# Patient Record
Sex: Female | Born: 1955
Health system: Southern US, Community
[De-identification: ages and names within clinical notes are randomized; demographics above are authoritative.]

## PROBLEM LIST (undated history)

## (undated) DIAGNOSIS — K5190 Ulcerative colitis, unspecified, without complications: Secondary | ICD-10-CM

## (undated) DIAGNOSIS — M1990 Unspecified osteoarthritis, unspecified site: Secondary | ICD-10-CM

## (undated) DIAGNOSIS — L409 Psoriasis, unspecified: Secondary | ICD-10-CM

## (undated) DIAGNOSIS — F32A Depression, unspecified: Secondary | ICD-10-CM

## (undated) DIAGNOSIS — R0683 Snoring: Principal | ICD-10-CM

## (undated) DIAGNOSIS — F329 Major depressive disorder, single episode, unspecified: Secondary | ICD-10-CM

## (undated) DIAGNOSIS — J189 Pneumonia, unspecified organism: Secondary | ICD-10-CM

## (undated) DIAGNOSIS — M549 Dorsalgia, unspecified: Secondary | ICD-10-CM

## (undated) DIAGNOSIS — K529 Noninfective gastroenteritis and colitis, unspecified: Secondary | ICD-10-CM

## (undated) DIAGNOSIS — K589 Irritable bowel syndrome without diarrhea: Secondary | ICD-10-CM

## (undated) DIAGNOSIS — E7800 Pure hypercholesterolemia, unspecified: Secondary | ICD-10-CM

## (undated) DIAGNOSIS — R51 Headache: Secondary | ICD-10-CM

## (undated) DIAGNOSIS — G44009 Cluster headache syndrome, unspecified, not intractable: Secondary | ICD-10-CM

## (undated) DIAGNOSIS — E669 Obesity, unspecified: Secondary | ICD-10-CM

## (undated) DIAGNOSIS — M25569 Pain in unspecified knee: Secondary | ICD-10-CM

## (undated) DIAGNOSIS — M2550 Pain in unspecified joint: Secondary | ICD-10-CM

## (undated) HISTORY — DX: Headache: R51

## (undated) HISTORY — DX: Ulcerative colitis, unspecified, without complications: K51.90

## (undated) HISTORY — PX: JOINT REPLACEMENT: SHX530

## (undated) HISTORY — DX: Pain in unspecified joint: M25.50

## (undated) HISTORY — DX: Psoriasis, unspecified: L40.9

## (undated) HISTORY — DX: Obesity, unspecified: E66.9

## (undated) HISTORY — DX: Pure hypercholesterolemia, unspecified: E78.00

## (undated) HISTORY — DX: Snoring: R06.83

## (undated) HISTORY — DX: Unspecified osteoarthritis, unspecified site: M19.90

## (undated) HISTORY — DX: Cluster headache syndrome, unspecified, not intractable: G44.009

## (undated) HISTORY — DX: Dorsalgia, unspecified: M54.9

## (undated) HISTORY — DX: Noninfective gastroenteritis and colitis, unspecified: K52.9

## (undated) HISTORY — DX: Irritable bowel syndrome, unspecified: K58.9

## (undated) HISTORY — DX: Pain in unspecified knee: M25.569

---

## 1988-12-03 HISTORY — PX: BREAST BIOPSY: SHX20

## 1992-12-03 HISTORY — PX: BREAST EXCISIONAL BIOPSY: SUR124

## 2000-09-04 ENCOUNTER — Encounter: Payer: Self-pay | Admitting: Family Medicine

## 2000-09-04 ENCOUNTER — Encounter: Admission: RE | Admit: 2000-09-04 | Discharge: 2000-09-04 | Payer: Self-pay | Admitting: Family Medicine

## 2001-09-18 ENCOUNTER — Encounter: Admission: RE | Admit: 2001-09-18 | Discharge: 2001-09-18 | Payer: Self-pay | Admitting: Family Medicine

## 2001-09-18 ENCOUNTER — Encounter: Payer: Self-pay | Admitting: Family Medicine

## 2002-06-26 ENCOUNTER — Ambulatory Visit (HOSPITAL_COMMUNITY): Admission: RE | Admit: 2002-06-26 | Discharge: 2002-06-26 | Payer: Self-pay | Admitting: *Deleted

## 2002-06-26 ENCOUNTER — Encounter (INDEPENDENT_AMBULATORY_CARE_PROVIDER_SITE_OTHER): Payer: Self-pay | Admitting: Specialist

## 2002-09-22 ENCOUNTER — Encounter: Payer: Self-pay | Admitting: Family Medicine

## 2002-09-22 ENCOUNTER — Encounter: Admission: RE | Admit: 2002-09-22 | Discharge: 2002-09-22 | Payer: Self-pay | Admitting: Family Medicine

## 2003-09-24 ENCOUNTER — Encounter: Admission: RE | Admit: 2003-09-24 | Discharge: 2003-09-24 | Payer: Self-pay | Admitting: Family Medicine

## 2003-09-24 ENCOUNTER — Encounter: Payer: Self-pay | Admitting: Family Medicine

## 2004-09-29 ENCOUNTER — Encounter: Admission: RE | Admit: 2004-09-29 | Discharge: 2004-09-29 | Payer: Self-pay | Admitting: Family Medicine

## 2005-05-09 ENCOUNTER — Ambulatory Visit: Payer: Self-pay | Admitting: Internal Medicine

## 2005-06-07 ENCOUNTER — Ambulatory Visit: Payer: Self-pay | Admitting: Internal Medicine

## 2005-08-09 ENCOUNTER — Ambulatory Visit: Payer: Self-pay | Admitting: Internal Medicine

## 2005-10-30 ENCOUNTER — Ambulatory Visit: Payer: Self-pay | Admitting: Internal Medicine

## 2006-11-20 ENCOUNTER — Encounter: Admission: RE | Admit: 2006-11-20 | Discharge: 2006-11-20 | Payer: Self-pay | Admitting: Family Medicine

## 2007-06-05 ENCOUNTER — Ambulatory Visit: Payer: Self-pay | Admitting: Internal Medicine

## 2007-06-05 LAB — CONVERTED CEMR LAB
ALT: 16 units/L (ref 0–35)
AST: 17 units/L (ref 0–37)
Albumin: 4.1 g/dL (ref 3.5–5.2)
Alkaline Phosphatase: 66 units/L (ref 39–117)
BUN: 12 mg/dL (ref 6–23)
Basophils Absolute: 0 10*3/uL (ref 0.0–0.1)
Basophils Relative: 0.5 % (ref 0.0–1.0)
Bilirubin, Direct: 0.1 mg/dL (ref 0.0–0.3)
CO2: 26 meq/L (ref 19–32)
Calcium: 9.7 mg/dL (ref 8.4–10.5)
Chloride: 110 meq/L (ref 96–112)
Creatinine, Ser: 0.8 mg/dL (ref 0.4–1.2)
Eosinophils Absolute: 0 10*3/uL (ref 0.0–0.6)
Eosinophils Relative: 0.2 % (ref 0.0–5.0)
GFR calc Af Amer: 98 mL/min
GFR calc non Af Amer: 81 mL/min
Glucose, Bld: 110 mg/dL — ABNORMAL HIGH (ref 70–99)
HCT: 40.5 % (ref 36.0–46.0)
Hemoglobin: 13.5 g/dL (ref 12.0–15.0)
Lymphocytes Relative: 16.2 % (ref 12.0–46.0)
MCHC: 33.3 g/dL (ref 30.0–36.0)
MCV: 89.9 fL (ref 78.0–100.0)
Monocytes Absolute: 0.2 10*3/uL (ref 0.2–0.7)
Monocytes Relative: 2.6 % — ABNORMAL LOW (ref 3.0–11.0)
Neutro Abs: 6.5 10*3/uL (ref 1.4–7.7)
Neutrophils Relative %: 80.5 % — ABNORMAL HIGH (ref 43.0–77.0)
Platelets: 281 10*3/uL (ref 150–400)
Potassium: 4.3 meq/L (ref 3.5–5.1)
RBC: 4.51 M/uL (ref 3.87–5.11)
RDW: 13.4 % (ref 11.5–14.6)
Sed Rate: 16 mm/hr (ref 0–25)
Sodium: 141 meq/L (ref 135–145)
Total Bilirubin: 0.6 mg/dL (ref 0.3–1.2)
Total Protein: 7.1 g/dL (ref 6.0–8.3)
WBC: 8 10*3/uL (ref 4.5–10.5)

## 2007-06-23 ENCOUNTER — Encounter: Payer: Self-pay | Admitting: Internal Medicine

## 2007-06-23 ENCOUNTER — Ambulatory Visit: Payer: Self-pay | Admitting: Internal Medicine

## 2007-06-23 DIAGNOSIS — K5190 Ulcerative colitis, unspecified, without complications: Secondary | ICD-10-CM | POA: Insufficient documentation

## 2007-07-29 ENCOUNTER — Ambulatory Visit: Payer: Self-pay | Admitting: Internal Medicine

## 2007-11-13 ENCOUNTER — Ambulatory Visit: Payer: Self-pay | Admitting: Internal Medicine

## 2007-11-13 LAB — CONVERTED CEMR LAB
Basophils Absolute: 0 10*3/uL (ref 0.0–0.1)
Basophils Relative: 0.7 % (ref 0.0–1.0)
Eosinophils Absolute: 0 10*3/uL (ref 0.0–0.6)
Eosinophils Relative: 0.8 % (ref 0.0–5.0)
HCT: 41.4 % (ref 36.0–46.0)
Hemoglobin: 14.3 g/dL (ref 12.0–15.0)
Lymphocytes Relative: 38.4 % (ref 12.0–46.0)
MCHC: 34.5 g/dL (ref 30.0–36.0)
MCV: 93.9 fL (ref 78.0–100.0)
Monocytes Absolute: 0.4 10*3/uL (ref 0.2–0.7)
Monocytes Relative: 6.7 % (ref 3.0–11.0)
Neutro Abs: 2.9 10*3/uL (ref 1.4–7.7)
Neutrophils Relative %: 53.4 % (ref 43.0–77.0)
Platelets: 278 10*3/uL (ref 150–400)
RBC: 4.41 M/uL (ref 3.87–5.11)
RDW: 12.4 % (ref 11.5–14.6)
WBC: 5.3 10*3/uL (ref 4.5–10.5)

## 2007-11-18 ENCOUNTER — Encounter: Payer: Self-pay | Admitting: Internal Medicine

## 2007-11-25 ENCOUNTER — Encounter: Admission: RE | Admit: 2007-11-25 | Discharge: 2007-11-25 | Payer: Self-pay | Admitting: Family Medicine

## 2007-12-02 DIAGNOSIS — R1031 Right lower quadrant pain: Secondary | ICD-10-CM | POA: Insufficient documentation

## 2007-12-02 DIAGNOSIS — K625 Hemorrhage of anus and rectum: Secondary | ICD-10-CM | POA: Insufficient documentation

## 2007-12-02 DIAGNOSIS — R197 Diarrhea, unspecified: Secondary | ICD-10-CM | POA: Insufficient documentation

## 2008-01-08 ENCOUNTER — Encounter: Admission: RE | Admit: 2008-01-08 | Discharge: 2008-01-08 | Payer: Self-pay | Admitting: Family Medicine

## 2009-01-19 ENCOUNTER — Encounter: Admission: RE | Admit: 2009-01-19 | Discharge: 2009-01-19 | Payer: Self-pay | Admitting: Family Medicine

## 2009-03-21 ENCOUNTER — Ambulatory Visit: Payer: Self-pay | Admitting: Internal Medicine

## 2009-03-22 LAB — CONVERTED CEMR LAB
ALT: 15 units/L (ref 0–35)
AST: 17 units/L (ref 0–37)
Albumin: 4.5 g/dL (ref 3.5–5.2)
Alkaline Phosphatase: 71 units/L (ref 39–117)
BUN: 7 mg/dL (ref 6–23)
Basophils Absolute: 0 10*3/uL (ref 0.0–0.1)
Basophils Relative: 0.7 % (ref 0.0–3.0)
CO2: 26 meq/L (ref 19–32)
Calcium: 9.9 mg/dL (ref 8.4–10.5)
Chloride: 107 meq/L (ref 96–112)
Creatinine, Ser: 0.8 mg/dL (ref 0.4–1.2)
Eosinophils Absolute: 0.1 10*3/uL (ref 0.0–0.7)
Eosinophils Relative: 1.1 % (ref 0.0–5.0)
GFR calc non Af Amer: 80.02 mL/min (ref >60)
Glucose, Bld: 91 mg/dL (ref 70–99)
HCT: 42.2 % (ref 36.0–46.0)
Hemoglobin: 14.6 g/dL (ref 12.0–15.0)
Lymphocytes Relative: 33.6 % (ref 12.0–46.0)
Lymphs Abs: 2.1 10*3/uL (ref 0.7–4.0)
MCHC: 34.5 g/dL (ref 30.0–36.0)
MCV: 89.7 fL (ref 78.0–100.0)
Monocytes Absolute: 0.4 10*3/uL (ref 0.1–1.0)
Monocytes Relative: 5.7 % (ref 3.0–12.0)
Neutro Abs: 3.6 10*3/uL (ref 1.4–7.7)
Neutrophils Relative %: 58.9 % (ref 43.0–77.0)
Platelets: 238 10*3/uL (ref 150.0–400.0)
Potassium: 4.3 meq/L (ref 3.5–5.1)
RBC: 4.71 M/uL (ref 3.87–5.11)
RDW: 12.8 % (ref 11.5–14.6)
Sed Rate: 10 mm/hr (ref 0–22)
Sodium: 143 meq/L (ref 135–145)
Total Bilirubin: 0.5 mg/dL (ref 0.3–1.2)
Total Protein: 7.3 g/dL (ref 6.0–8.3)
WBC: 6.2 10*3/uL (ref 4.5–10.5)

## 2010-01-20 ENCOUNTER — Encounter: Admission: RE | Admit: 2010-01-20 | Discharge: 2010-01-20 | Payer: Self-pay | Admitting: Family Medicine

## 2010-08-29 ENCOUNTER — Emergency Department (HOSPITAL_COMMUNITY): Admission: EM | Admit: 2010-08-29 | Discharge: 2010-08-29 | Payer: Self-pay | Admitting: Emergency Medicine

## 2010-12-23 ENCOUNTER — Other Ambulatory Visit: Payer: Self-pay | Admitting: Family Medicine

## 2010-12-23 DIAGNOSIS — S299XXA Unspecified injury of thorax, initial encounter: Secondary | ICD-10-CM

## 2011-01-02 NOTE — Procedures (Signed)
Summary: Gastroenterology Colon  Gastroenterology Colon   Imported By: Barb Merino RN 12/15/2007 09:09:31  _____________________________________________________________________  External Attachment:    Type:   Image     Comment:   External Document

## 2011-01-02 NOTE — Assessment & Plan Note (Signed)
Summary: FOLLOW UP/.YF    History of Present Illness Visit Type: Follow-up Visit Primary GI MD: Delfin Edis MD Primary Provider: Providence Crosby Chief Complaint: Yearly follow up visit No GI complaints History of Present Illness:   This is a 55 old white female who has inflammatory bowel disease diagnosed on a colonoscopy done in July 2008. The random colon biopsies confirmed  pancolitis , possibly  Crohn's disease. Her last office visit in December 2008 showed subtherapeutic  levels of 6 MP, probably due to pt forgetting to take her medication. She has been in remission now for almost 2 years and is not taking any of her medications. 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 She denies any rectal bleeding or abdominal pain. Her weight has been stable.   GI Review of Systems      Denies abdominal pain, acid reflux, belching, bloating, chest pain, dysphagia with liquids, dysphagia with solids, heartburn, loss of appetite, nausea, vomiting, vomiting blood, weight loss, and  weight gain.        Denies anal fissure, black tarry stools, change in bowel habit, constipation, diarrhea, diverticulosis, fecal incontinence, heme positive stool, hemorrhoids, irritable bowel syndrome, jaundice, light color stool, liver problems, rectal bleeding, and  rectal pain.    Current Medications (verified): 1)  None  Allergies (verified): 1)  ! Sulfa  Past History:  Past Medical History:    Current Problems:     RECTAL BLEEDING (ICD-569.3)    COLITIS, ULCERATIVE (ICD-556.9)    ABDOMINAL PAIN RIGHT LOWER QUADRANT (ICD-789.03)    DIARRHEA (ICD-787.91)     (12/02/2007)  Past Surgical History:    none (02/14/2009)  Family History:    Reviewed history from 02/14/2009 and no changes required:       No FH of Colon Cancer:       Family History of Diabetes: Father  Social History:    Patient currently smokes.      Alcohol Use - no    Illicit Drug Use - no    Daily  Caffeine Use coffee  Vital Signs:  Patient profile:   55 year old female Height:      63 inches Weight:      238.25 pounds BMI:     42.36 BSA:     2.08 Pulse rate:   64 / minute Pulse rhythm:   regular BP sitting:   118 / 82  (left arm)  Vitals Entered By: [place] (March 21, 2009 3:41 PM)  Physical Exam  General:  Well developed, well nourished, no acute distress. Lungs:  Clear throughout to auscultation. Heart:  Regular rate and rhythm; no murmurs, rubs or bruits. Abdomen:  Soft, nontender and nondistended. No masses, hepatosplenomegaly or hernias noted. Normal bowel sounds. Rectal:  soft hemoccult negative stool. No perianal disease.   Impression & Recommendations:  Problem # 1:  COLITIS, ULCERATIVE (ICD-556.9) inflammatory bowel disease since July 2008. Patient has been off all medications for the past 2 years. Her last medication was 6 MP. Patient is not interested in resuming medications unless she has a flareup. I have talked to her and explained that it advisable to stay on low dose of mesalamine. I have given her samples of Apriso 375 mg  to take 1 by mouth two times a day if she flares up. We will send her to the lab to have bloodwork drawn. Orders: TLB-CBC Platelet - w/Differential (85025-CBCD) TLB-CMP (Comprehensive Metabolic Pnl) (46503-TWSF) TLB-Sedimentation Rate (ESR) (  85652-ESR)  Problem # 2:  RECTAL BLEEDING (ICD-569.3) she is hemoccult-negative today. We will be checking her CBC today.  Patient Instructions: 1)  samples of Apriso 375 mg , 1 by mouth two times a day 2)  CBC, metabolic panel, and sedimentation rate 3)  office visit in one year 4)  Recall colonoscopy July 2013 5)  Copy sent to : Dr Morrie Sheldon

## 2011-01-22 ENCOUNTER — Ambulatory Visit
Admission: RE | Admit: 2011-01-22 | Discharge: 2011-01-22 | Disposition: A | Discharge status: Home / Self Care | Payer: BC Managed Care – PPO | Source: Ambulatory Visit | Attending: Family Medicine | Admitting: Family Medicine

## 2011-01-22 DIAGNOSIS — S299XXA Unspecified injury of thorax, initial encounter: Secondary | ICD-10-CM

## 2011-02-15 LAB — COMPREHENSIVE METABOLIC PANEL
ALT: 15 U/L (ref 0–35)
AST: 18 U/L (ref 0–37)
Albumin: 4.1 g/dL (ref 3.5–5.2)
Alkaline Phosphatase: 86 U/L (ref 39–117)
BUN: 10 mg/dL (ref 6–23)
CO2: 23 mEq/L (ref 19–32)
Calcium: 9.5 mg/dL (ref 8.4–10.5)
Chloride: 109 mEq/L (ref 96–112)
Creatinine, Ser: 0.77 mg/dL (ref 0.4–1.2)
GFR calc Af Amer: >60 mL/min (ref >60)
GFR calc non Af Amer: >60 mL/min (ref >60)
Glucose, Bld: 106 mg/dL — ABNORMAL HIGH (ref 70–99)
Potassium: 3.4 mEq/L — ABNORMAL LOW (ref 3.5–5.1)
Sodium: 140 mEq/L (ref 135–145)
Total Bilirubin: 0.4 mg/dL (ref 0.3–1.2)
Total Protein: 6.9 g/dL (ref 6.0–8.3)

## 2011-02-15 LAB — DIFFERENTIAL
Basophils Absolute: 0 10*3/uL (ref 0.0–0.1)
Basophils Relative: 1 % (ref 0–1)
Eosinophils Absolute: 0 10*3/uL (ref 0.0–0.7)
Eosinophils Relative: 1 % (ref 0–5)
Lymphocytes Relative: 35 % (ref 12–46)
Lymphs Abs: 2.4 10*3/uL (ref 0.7–4.0)
Monocytes Absolute: 0.4 10*3/uL (ref 0.1–1.0)
Monocytes Relative: 6 % (ref 3–12)
Neutro Abs: 4 10*3/uL (ref 1.7–7.7)
Neutrophils Relative %: 57 % (ref 43–77)

## 2011-02-15 LAB — CBC
HCT: 42.7 % (ref 36.0–46.0)
Hemoglobin: 14.5 g/dL (ref 12.0–15.0)
MCH: 30.3 pg (ref 26.0–34.0)
MCHC: 34 g/dL (ref 30.0–36.0)
MCV: 89.3 fL (ref 78.0–100.0)
Platelets: 242 10*3/uL (ref 150–400)
RBC: 4.78 MIL/uL (ref 3.87–5.11)
RDW: 12.8 % (ref 11.5–15.5)
WBC: 6.7 10*3/uL (ref 4.0–10.5)

## 2011-02-15 LAB — POCT I-STAT, CHEM 8
BUN: 10 mg/dL (ref 6–23)
Calcium, Ion: 1.02 mmol/L — ABNORMAL LOW (ref 1.12–1.32)
Chloride: 110 mEq/L (ref 96–112)
Creatinine, Ser: 0.7 mg/dL (ref 0.4–1.2)
Glucose, Bld: 104 mg/dL — ABNORMAL HIGH (ref 70–99)
HCT: 45 % (ref 36.0–46.0)
Hemoglobin: 15.3 g/dL — ABNORMAL HIGH (ref 12.0–15.0)
Potassium: 3.4 mEq/L — ABNORMAL LOW (ref 3.5–5.1)
Sodium: 139 mEq/L (ref 135–145)
TCO2: 21 mmol/L (ref 0–100)

## 2011-02-15 LAB — PROTIME-INR
INR: 0.93 (ref 0.00–1.49)
Prothrombin Time: 12.7 seconds (ref 11.6–15.2)

## 2011-02-15 LAB — POCT CARDIAC MARKERS
CKMB, poc: <1 ng/mL — ABNORMAL LOW (ref 1.0–8.0)
Myoglobin, poc: 108 ng/mL (ref 12–200)
Troponin i, poc: <0.05 ng/mL (ref 0.00–0.09)

## 2011-02-15 LAB — ETHANOL: Alcohol, Ethyl (B): <5 mg/dL (ref 0–10)

## 2011-02-15 LAB — APTT: aPTT: 25 seconds (ref 24–37)

## 2011-02-15 LAB — LACTIC ACID, PLASMA: Lactic Acid, Venous: 1.8 mmol/L (ref 0.5–2.2)

## 2011-04-17 NOTE — Assessment & Plan Note (Signed)
[place] OFFICE NOTE   TYNLEE, BAYLE                      MRN:          [id number]  DATE:11/13/2007                            DOB:          1956-11-09    Ms. Kluttz is a very nice 55 year old white female with a newly  diagnosed ulcerative colitis, documented on a colonoscopy on June 23, 2007, with random biopsies of the colon.  A right colon biopsy showed a  focal active mucosal colitis, descending colon also confirmed focal  active mucosal colitis, sigmoid colon showed focal active mucosal  colitis without granulomas.  Now in the rectum, there was some  regenerative type glands and signs of mucosal prolapse but no active  proctitis.  The patient has been started on 6-Mercaptopurine of 50 mg  daily and has remained completely asymptomatic.  She is down to having  only 1 bowel movement a day or twice a day.  No abdominal pain.  No  rectal bleeding.  General level of energy has been good.  She has  tapered her prednisone completely.  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  She is currently slowing down and will have a rather quiet  winter at work.  The strenuous job probably starts around March.   PHYSICAL EXAMINATION:  VITAL SIGNS:  Blood pressure 110/70, pulse 64,  weight 229 pounds.  GENERAL:  She was overweight, alert, oriented, no distress.  LUNGS:  Clear to auscultation.  COR:  Normal S1, normal S2.  ABDOMEN:  Soft, nontender with normoactive bowel sounds.  RECTAL:  Not done.   IMPRESSION:  A 55 year old white female with ulcerative colitis in  distribution throughout the entire colon as per biopsies in 2008.   PLAN:  1. Continue 6-Mercaptopurine for now.  2. Office visit 1 year.  3. Today CBC and metabolite levels.     Lowella Bandy. Olevia Perches, MD  Electronically Signed    DMB/MedQ  DD: 11/13/2007  DT: 11/13/2007  Job #: [id number]   cc:   Chipper Herb, M.D.

## 2011-04-17 NOTE — Assessment & Plan Note (Signed)
[place] OFFICE NOTE   TEQUIA, WOLMAN                      MRN:          [id number]  DATE:06/05/2007                            DOB:          [birth year]    Ms. Farler is a 55 year old white female with Crohn's disease or  ulcerative colitis diagnosed on colonoscopy by Dr. Everlean Cherry in 2003.  We  saw her once in 2006 when she had a flareup with diarrhea and rectal  bleeding.  She responded to prednisone 20 mg a day in a tapered fashion.  She turned out to be allergic to SULFA and could not take AZULFIDINE.  She has not come back up  for the past 3 years.  She has done quite well  having about 2 soft bowel movements a day, no rectal bleeding or  abdominal pain.  In the last several weeks, she developed diarrhea,  crampy abdominal pain and frequency.  She works as a Programmer, applications outdoors everyday, which makes the management of the  diarrhea quite difficult for her.  We called her in prednisone 30 mg a  day, 3 days ago and she has had marked improvement of her symptoms  already.  She also is on __________ p.r.n.  She again denies rectal  bleeding.   MEDICATIONS:  1. Prednisone 30 mg a day x3 days.  2. __________  0.375 mg b.i.d.   PHYSICAL EXAMINATION:  Blood pressure 110/72, pulse 68, and weight 237  pounds.  She was alert, oriented and in no distress.  LUNGS:  Clear to auscultation with normal S1, normal S2.  ABDOMEN:  Soft, relaxed.  There is normoactive bowel sounds.  Tenderness  in right lower quadrant overlying the terminal ileum.  Left lower and  upper quadrants are normal.  RECTAL:  Soft Hemoccult negative stool.   IMPRESSION:  A 55 year old white female with inflammatory bowel disease,  diagnosed on colonoscopy 5 years ago.  She is allergic to SULFA.  She is  so far responding to oral prednisone.  We need to find out the extent of  the disease and is less severity, she may  be a candidate for 6  mercaptopurine therapy or possibly some other type of mesalamine .  She  would have to go through a desensitization process to make sure that she  does not develop allergic reaction to Asacol or __________ .   PLAN:  1. Colonoscopy scheduled.  2. Start tapering the prednisone to 20 mg so we can down by 5 mg every      week.  3. Continue __________  .  4. CBC, sed rate and CMETs today.     Lowella Bandy. Olevia Perches, MD  Electronically Signed    DMB/MedQ  DD: 06/05/2007  DT: 06/05/2007  Job #: [id number]

## 2011-04-17 NOTE — Assessment & Plan Note (Signed)
[place] OFFICE NOTE   DYMOND, GUTT                      MRN:          [id number]  DATE:07/29/2007                            DOB:          Feb 08, 1956    [name] is a very nice 55 year old white female with inflammatory  bowel disease of at least 5 years duration. Most recent colonoscopy on  June 23, 2007, confirmed ulcerative colitis. She had a mild low grade  chronic colitis on the biopsy throughout the colon. She was put on  tapered dose of prednisone as well as 6-MP as an immunomodulator. She  comes today being completely asymptomatic. Her bowel habits are regular.  There is no diarrhea or bleeding. She has been on 6-MP now for one month  and we will start reducing her prednisone. Her only complaint is  intermittent right lower quadrant abdominal pain. The etiology is not  clear. It may have something to do with the bowel movements. The pain is  intermittent. It could be related to irritable bowel syndrome or colon  spasms.   PHYSICAL EXAMINATION:  Blood pressure 112/78. Pulse 80. Weight is 233  pounds. She was alert, oriented and in no distress.  LUNGS: Clear to auscultation.  COR: Normal  S1, normal S2.  ABDOMEN: Soft, with minimal tenderness in right lower quadrant.  Normoactive bowel sounds. Left lower quadrant was normal.  RECTAL: Exam not repeated.   IMPRESSION:  A 55 year old white female with ulcerative colitis  confirmed on biopsy from colonoscopy on June 23, 2007. She is doing well  on 6-MP 50 mg a day.   PLAN:  1. Decrease prednisone to 5 mg a day for four weeks and then 5 mg      every other day for four weeks and then discontinue.  2. Return in six months. During next appointment, we will check her      CBC.     Lowella Bandy. Olevia Perches, MD  Electronically Signed    DMB/MedQ  DD: 07/29/2007  DT: 07/29/2007  Job #: [id number]   cc:   Chipper Herb, M.D.

## 2011-04-20 NOTE — Procedures (Signed)
[place]  Patient:    [facility] Visit Number: [id number] MRN: [id number]          Service Type: END Location: ENDO Attending Physician:  Woody Seller Dictated by:   Woody Seller., M.D. Proc. Date: 06/26/02 Admit Date:  06/26/2002   CC:         Dr. Ival Bible   Procedure Report  REFERRING PHYSICIAN:  Dr. Ival Bible.  PREOPERATIVE DIAGNOSIS:  Persistent diarrhea for four weeks duration.  POSTOPERATIVE DIAGNOSIS:  Acute inflammatory bowel disease (ulcerative colitis) versus Crohns disease.  PROCEDURE:  Colonoscopy with biopsies.  MEDICATIONS: 1. Demerol 110 mg IV. 2. Versed 11 mg IV over 20 minute period of time.  INSTRUMENT:  Olympus video pancolonoscope.  ENDOSCOPIST:  Katherina Mires, Brooke Bonito., M.D.  SUBJECTIVE:  This is a pleasant 55 year old white female who presents to the office 24 hours ago with a history of diarrhea of one months duration.  She states that it occurred at that time and progressively worsening of the period of one month.  She states that occasionally she passes a little blood that is present, but usually, the blood is bright in color.  She denies any history of any abdominal pains with the onset at this time, but does admit that this gets triggered even more so when she eats something with her meals.  She had been seen by her primary physician who gave her Lomotil, but the medicine did not completely help her symptoms at this time.  She subsequently was referred for an evaluation after stool cultures and sensitivities were done and the results were negative.  There has been no history of any inflammatory bowel disease, no history of any previous problems that could trigger diarrhea in the patient.  She denies any history of any other problems in her general system area at this time.  She is presently not taking any medications.  ALLERGIES:  No known drug allergies.  SOCIAL HISTORY:  She does admit  to smoking, but does not admit to any ethanol ingestion.  OBJECTIVE FINDINGS:  She is a very pleasant female who appears to be in no acute distress.  Vital signs are stable.  Her HEENT examination was positive for mild arcus senilis.  The neck was supple.  The lungs were clear to auscultation and percussion.  The heart had a regular rate and rhythm without heaves, thrills, murmurs, or gallops.  The abdomen was soft.  There was no gross tenderness to palpation.  No hepatosplenomegaly that was noted.  Digital rectal exam was normal.  The extremities showed no cyanosis, clubbing, or edema.  IMPRESSION:  Persistent diarrhea of four months duration, rule out possibility of inflammatory bowel disease (although not bloody, rule out the possibility of collagenous colitis, rule out infectious etiology at this time).  Previous cultures and sensitivities by her primary doctors were unremarkable.  RECOMMENDATIONS:  Presently, I am going to do an unprepped colonoscopy on the patient, and depending upon these results will determine the course of therapy.  INFORMED CONSENT:  The patient was advised of the procedure, indications, and the risks involved.  She has viewed the video and questions were answered. She has agreed to have the procedure performed.  PREOPERATIVE PREPARATION:  The patient was brought in the [facility] endoscopy unit where the IV for IV sedating medication was started.  A monitor was placed on the patient to monitor the patients vital signs and oxygen saturation.  Nasal oxygen at approximately 3  L/min was used, and after adequate sedation was performed, the procedure was begun.  BOWEL PREP:  The patient was not given a bowel prep because of the diarrhea. I explained to her that if she has a lot of stool, obstipation could be a contributing factor to her diarrhea symptoms.  She understands this.  DESCRIPTION OF PROCEDURE:  The instrument was advanced with the patient lying in  the left lateral position to approximately 100 cm from the proximal colon to the cecal region.  This was confirmed by the visualization of the appendiceal orifice that was noted and palpation.  There appeared to be no gross abnormalities such as masses, polyps, or stricture lesions appreciated. The vascular pattern appeared to be poorly visualized due to the mucosal inflammation that was noted at this time.  However, on gross inspection, there were no gross abnormalities noted from the vascular pattern that was appreciated.  The mucosal pattern however, showed evidence of moderate to acute inflammation that was present throughout the entire colon.  The mucosal pattern also showed areas of varying inflammation in certain spots with some areas more inflamed than others at this time.  There appeared to be small erosions that was noted on some of the mucosal tissue that was present at this time.  I did not appreciate any major ulcerations that was present at this time.  There was no tortuosity at the colon that was appreciated.  Because it was an unprepped colon, there was increased stool present in the cecal region, but most of the stool was loose and soft that was present.  The areas were washed on occasions for adequate visualization noted.  Random biopsies were taken in two parts (from the cecum to the descending colon and two from the descending colon to the rectal region for analysis). The instrument was subsequently removed per rectum at this time without difficulty.  The instrument was ________ in the rectum with visualization of the anal verge which appeared to show no gross abnormalities.  The instrument was removed per rectum without difficulty where the patient tolerated the procedure well.  TREATMENT:  I will start the patient on Pentasa, approximately 250 mg five to six times a day and increase to 1 gram approximately five to six times a day, and depending upon these results  will determine the course of therapy.  She is to follow up with me in approximately two weeks and notify me of any complications. Dictated by:   Woody Seller., M.D. Attending Physician:  Woody Seller DD:  06/26/02 TD:  06/29/02 Job: [id number]

## 2011-08-26 ENCOUNTER — Emergency Department (HOSPITAL_COMMUNITY): Payer: BC Managed Care – PPO

## 2011-08-26 ENCOUNTER — Emergency Department (HOSPITAL_COMMUNITY)
Admission: EM | Admit: 2011-08-26 | Discharge: 2011-08-26 | Disposition: A | Discharge status: Home / Self Care | Payer: BC Managed Care – PPO | Attending: Emergency Medicine | Admitting: Emergency Medicine

## 2011-08-26 DIAGNOSIS — R51 Headache: Secondary | ICD-10-CM | POA: Insufficient documentation

## 2011-08-26 DIAGNOSIS — R110 Nausea: Secondary | ICD-10-CM | POA: Insufficient documentation

## 2011-08-26 DIAGNOSIS — H53149 Visual discomfort, unspecified: Secondary | ICD-10-CM | POA: Insufficient documentation

## 2011-08-26 DIAGNOSIS — J3489 Other specified disorders of nose and nasal sinuses: Secondary | ICD-10-CM | POA: Insufficient documentation

## 2011-08-26 LAB — GLUCOSE, CAPILLARY: Glucose-Capillary: 108 mg/dL — ABNORMAL HIGH (ref 70–99)

## 2011-12-25 ENCOUNTER — Other Ambulatory Visit: Payer: Self-pay | Admitting: Family Medicine

## 2011-12-25 DIAGNOSIS — Z1231 Encounter for screening mammogram for malignant neoplasm of breast: Secondary | ICD-10-CM

## 2012-01-24 ENCOUNTER — Ambulatory Visit
Admission: RE | Admit: 2012-01-24 | Discharge: 2012-01-24 | Disposition: A | Discharge status: Home / Self Care | Payer: BC Managed Care – PPO | Source: Ambulatory Visit | Attending: Family Medicine | Admitting: Family Medicine

## 2012-01-24 DIAGNOSIS — Z1231 Encounter for screening mammogram for malignant neoplasm of breast: Secondary | ICD-10-CM

## 2012-04-21 ENCOUNTER — Encounter: Payer: Self-pay | Admitting: Internal Medicine

## 2012-11-03 ENCOUNTER — Other Ambulatory Visit: Payer: Self-pay | Admitting: Obstetrics and Gynecology

## 2013-02-12 ENCOUNTER — Other Ambulatory Visit: Payer: Self-pay

## 2013-02-12 DIAGNOSIS — Z1231 Encounter for screening mammogram for malignant neoplasm of breast: Secondary | ICD-10-CM

## 2013-03-07 ENCOUNTER — Other Ambulatory Visit: Payer: Self-pay | Admitting: Family Medicine

## 2013-03-10 ENCOUNTER — Ambulatory Visit
Admission: RE | Admit: 2013-03-10 | Discharge: 2013-03-10 | Disposition: A | Discharge status: Home / Self Care | Payer: BC Managed Care – PPO | Source: Ambulatory Visit

## 2013-03-10 DIAGNOSIS — Z1231 Encounter for screening mammogram for malignant neoplasm of breast: Secondary | ICD-10-CM

## 2013-03-12 ENCOUNTER — Encounter: Payer: Self-pay | Admitting: Internal Medicine

## 2013-03-14 ENCOUNTER — Other Ambulatory Visit: Payer: Self-pay | Admitting: Neurology

## 2013-04-15 ENCOUNTER — Other Ambulatory Visit: Payer: Self-pay | Admitting: Neurology

## 2013-05-10 ENCOUNTER — Other Ambulatory Visit: Payer: Self-pay | Admitting: Family Medicine

## 2013-05-15 ENCOUNTER — Other Ambulatory Visit: Payer: Self-pay | Admitting: Neurology

## 2013-05-19 ENCOUNTER — Encounter: Payer: Self-pay | Admitting: Internal Medicine

## 2013-06-08 ENCOUNTER — Other Ambulatory Visit: Payer: Self-pay | Admitting: Neurology

## 2013-06-11 ENCOUNTER — Telehealth: Payer: Self-pay | Admitting: Neurology

## 2013-06-11 MED ORDER — VERAPAMIL HCL ER 180 MG PO TBCR: 180.0000 mg | EXTENDED_RELEASE_TABLET | Freq: Every day | ORAL | Status: DC

## 2013-06-11 NOTE — Telephone Encounter (Signed)
Pt called asking why she couldn't get her medication verapamil (CALAN-SR) 180 MG CR tablet, I advised her per notes she needed to make an apt. I did set her up for next Wed so pt wants someone to call her about getting her prescription so that she doesn't run out. Thanks

## 2013-06-11 NOTE — Telephone Encounter (Signed)
Rx has been sent.  Called patient, got no answer.

## 2013-06-17 ENCOUNTER — Ambulatory Visit (INDEPENDENT_AMBULATORY_CARE_PROVIDER_SITE_OTHER): Payer: BC Managed Care – PPO | Admitting: Neurology

## 2013-06-17 ENCOUNTER — Encounter: Payer: Self-pay | Admitting: Neurology

## 2013-06-17 DIAGNOSIS — G44009 Cluster headache syndrome, unspecified, not intractable: Secondary | ICD-10-CM

## 2013-06-17 DIAGNOSIS — G44019 Episodic cluster headache, not intractable: Secondary | ICD-10-CM | POA: Insufficient documentation

## 2013-06-17 MED ORDER — VERAPAMIL HCL ER 180 MG PO TBCR: 180.0000 mg | EXTENDED_RELEASE_TABLET | Freq: Every day | ORAL | Status: DC

## 2013-06-17 NOTE — Progress Notes (Signed)
[facility]  Provider:  Dr Therisa Mennella Referring Provider: [name], MD Primary Care Physician:  [name], MD  Chief Complaint  Patient presents with  . Migraine    RM#11    HPI:  Wendy Crawford is a 57 y.o. female here as a referral from Dr. Dennard Schaumann for severe headaches.   I first encountered this patient in October 2012,  at the time she complained of a left orbital severe stabbing headache that also radiated to her left for head and temple.  These were sudden onset of sharp electric shock sensations after the main pain resolved,  the residual dysesthesia  was described as " a feeling of sand in her eyes" and   all over the left side of her face and numbness in the face.  The patient had been treated in the year preceding my first visit with her for sinusitis she had been on prednisone at [place] pulse medications made the pain somewhat less intense but didn't help along. She also had tearing of the left eye, alongside  a flushed left face and a runny nose  ( left side ) after each attack.  Topiramate was first helpful,   but she states now that she also will awake from  sleep - caused by her headaches. The patient was therefore titrated to verapamil along with 02 therapy. 02 use prn  in case that her headaches would still appear . She had a very good response and her cluster headaches with SUNCT  have not bothered her throughout the last 18 months.   I would like to add that the patient had in early 2013 undergone EMG and nerve conduction studies which returned suggestive of a pelvic nerve lesion an MRI followed and confirmed a left-sided pelvic floor very insists that her gynecologist, Dr. [name]  has followed the  cyst  by ultrasound she has not seen recent changes.  Today's visit is to refill the patient's medication :she's currently  taking ibuprofen and verapamil , and she still takes 20 minutes of Celexa a day, she has the oxygen at home  but hasn't needed to use it a long time. Her risk factors are smoking , obesity and menopause .  Review of Systems: Out of a complete 14 system review, the patient complains of only the following symptoms, and all other reviewed systems are negative.  obeisty, no headaches of cluster quality, active smoking.    History   Social History  . Marital Status: Married    Spouse Name: N/A    Number of Children: 0  . Years of Education: N/A   Occupational History  . self employed    Social History Main Topics  . Smoking status: Current Every Day Smoker -- 0.50 packs/day  . Smokeless tobacco: Not on file  . Alcohol Use: No  . Drug Use: No  . Sexually Active: Not on file   Other Topics Concern  . Not on file   Social History Narrative  . No narrative on file    Family History  Problem Relation Age of Onset  . Hypertension Mother   . Diabetes Sister   . Diabetes Brother   . Heart disease Maternal Grandmother   . Heart disease Maternal Grandfather     Past Medical History  Diagnosis Date  . Psoriasis   . Headache(784.0)     most severe for 2 months,cluster  . Colitis     ulcerative  . 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 Headaches, cluster     02 and medication    Past Surgical History  Procedure Laterality Date  . Breast biopsy  1990    Current Outpatient Prescriptions  Medication Sig Dispense Refill  . citalopram (CELEXA) 20 MG tablet TAKE 1 TABLET BY MOUTH DAILY  30 tablet  3  . ibuprofen (ADVIL,MOTRIN) 800 MG tablet Take 800 mg by mouth every 8 (eight) hours as needed for pain.      . verapamil (CALAN-SR) 180 MG CR tablet Take 1 tablet (180 mg total) by mouth at bedtime.  30 tablet  0  . gabapentin (NEURONTIN) 300 MG capsule Take 300 mg by mouth 3 (three) times daily.       No current facility-administered medications for this visit.    Allergies as of 06/17/2013 - Review Complete 06/17/2013  Allergen Reaction Noted  .  Sulfa antibiotics  06/17/2013  . Sulfonamide derivatives  02/14/2009    Vitals: BP 123/82  Pulse 66  Resp 18  Ht 5' 1.5" (1.562 m)  Wt 246 lb (111.585 kg)  BMI 45.73 kg/m2 Last Weight:  Wt Readings from Last 1 Encounters:  06/17/13 246 lb (111.585 kg)   Last Height:   Ht Readings from Last 1 Encounters:  06/17/13 5' 1.5" (1.562 m)     Physical exam:  General: The patient is awake, alert and appears not in acute distress. The patient is well groomed. Head: Normocephalic, atraumatic. Neck is supple. Mallampati 4 , neck circumference: 16 , no retrognathia. Cardiovascular:  Regular rate and rhythm, without  murmurs or carotid bruit, and without distended neck veins. Respiratory: Lungs are clear to auscultation. Skin:  Without evidence of edema, or rash Trunk: BMI is  elevated and patient  has normal posture.  Neurologic exam : The patient is awake and alert, oriented to place and time.  Memory subjective described as intact. There is a normal attention span & concentration ability.   Speech is fluent without dysarthria, dysphonia or aphasia. Mood and affect are appropriate.  Cranial nerves: Pupils are equal and briskly reactive to light. Funduscopic exam without  evidence of pallor or edema. Extraocular movements  in vertical and horizontal planes intact and without nystagmus. Visual fields by finger perimetry are intact. Hearing to finger rub intact.  Facial sensation intact to fine touch. Facial motor strength is symmetric and tongue and uvula move midline.  Motor exam:  Normal tone and normal muscle bulk and symmetric normal strength in all extremities. She had  lost her left patella reflex - with the pelvic floor changes and has now recovered her DTR ,  no longer pain, occ numbness inner thigh, and no longer give away weakness.   Sensory:  Fine touch, pinprick and vibration were tested in all extremities. Proprioception is  normal.  Coordination: Rapid alternating movements  in the fingers/hands is tested and normal. Finger-to-nose maneuver without evidence of ataxia, dysmetria or tremor.  Gait and station: Patient walks without assistive device . Strength within normal limits. Stance is stable and normal.  Steps are unfragmented. Romberg testing is normal.  Deep tendon reflexes: in the  upper and lower extremities are symmetric and intact. Babinski maneuver response is  downgoing.   Assessment:  After physical and neurologic examination, review of laboratory studies, imaging, neurophysiology testing and pre-existing records, assessment will be reviewed on the problem list.  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 We'll refill her medication at the current level for a year. #  2 the patient is a history of ulcerative colitis she cannot take NSAIDs she told me. #3 history of sciatica with left foot drop and L4-5 dermatomal pain attenuated left patellar reflex this has recovered the patient is basically not longer in pain seems to have normal strength and has recovered her left patellar reflex.  Please also refer to the EMG from last year.   I encouraged the patient again to stop smoking she knows that this is one of her main risk factors for cluster and SUNCT headaches. She denies any SOB and snoring or apnea.    I will give her a list of assistive information for her task as well as some educational material about cluster headaches.  Plan:  Treatment plan  under Problem List.

## 2013-06-17 NOTE — Patient Instructions (Addendum)
Cluster Headache Cluster headaches are head pains that are deeply painful. They often occur on one side of the head. Cluster headaches can:  Happen often for a few weeks or months, and then go away for a while.  Last 15 minutes to 3 hours.  Happen at the same time each day.  Often happen at night.  Happen many times a day. HOME CARE  During times when you have cluster headaches:  Get the same amount of sleep every night, at the same time each night.  Avoid alcohol.  Stop smoking. GET HELP RIGHT AWAY IF:  There are changes in how bad or how often your headaches happen.  Your medicines are not helping.  You pass out (faint).  You become weak or lose feeling (numbness) on one side of your body or face.  You see two of everything (double vision).  You feel sick to your stomach (nauseous) or throw up (vomit).  You are off balance or have trouble talking or walking.  You have neck pain or stiffness.  You have a fever. MAKE SURE YOU:  Understand these instructions.  Will watch your condition.  Will get help right away if you are not doing well or get worse. Document Released: 12/27/2004 Document Revised: 02/11/2012 Document Reviewed: 03/19/2011 [facility] Patient Information 2014 [place], [place]. SUNCT Headache Information A SUNCT headache is a short-lasting, intense headache affecting one side of the head. It is a rare form of headache. It is most common in men after age 30. The disorder is noticeable as bursts of moderate to severe burning, stabbing, or throbbing pain. It usually occurs on one side of the head and around the eye or temple. Attacks typically occur in daytime. Attacks typically last from 5 seconds to 4 minutes. Patients generally have 5 to 6 attacks per hour. SYMPTOMS   Watery eyes, reddish or bloodshot eyes caused by dilation of blood vessels (conjunctival injection).  Nasal congestion (stuffiness), runny nose.  Sweaty forehead.  Swelling of the  eyelids.  Increased pressure within the eye on the affected side of head.  Systolic blood pressure may rise during the attacks. Movement of the neck may trigger these headaches. SUNCT headaches may be part of a broader disorder that causes intense pain in the eyes, lips, nose, scalp, forehead, and jaw (trigeminal neuralgia). SUNCT headaches are considered one of a group of headache disorders (trigeminal autonomic cephalgias, or TACs). TREATMENT  These headaches are generally not responsive to usual treatment for short-lasting headaches. Drugs that may relieve symptoms in some patients include:  Corticosteroids.  Anti-epileptic drugs:  Gabapentin.  Lamotrigine.  Carbamazepine. Studies have shown that injections of glycerol to block the facial nerves may provide immediate relief. Headaches recurred in about 40 percent of patients studied.  PROGNOSIS  There is no cure for these headaches. The disorder is not fatal but can cause considerable discomfort. Document Released: 10/12/2004 Document Revised: 02/11/2012 Document Reviewed: 11/07/2007 [facility] Patient Information 2014 [place]. Smoking Cessation Quitting smoking is important to your health and has many advantages. However, it is not always easy to quit since nicotine is a very addictive drug. Often times, people try 3 times or more before being able to quit. This document explains the best ways for you to prepare to quit smoking. Quitting takes hard work and a lot of effort, but you can do it. ADVANTAGES OF QUITTING SMOKING  You will live longer, feel better, and live better.  Your body will feel the impact of quitting smoking almost  immediately.  Within 20 minutes, blood pressure decreases. Your pulse returns to its normal level.  After 8 hours, carbon monoxide levels in the blood return to normal. Your oxygen level increases.  After 24 hours, the chance of having a heart attack starts to decrease. Your breath, hair, and  body stop smelling like smoke.  After 48 hours, damaged nerve endings begin to recover. Your sense of taste and smell improve.  After 72 hours, the body is virtually free of nicotine. Your bronchial tubes relax and breathing becomes easier.  After 2 to 12 weeks, lungs can hold more air. Exercise becomes easier and circulation improves.  The risk of having a heart attack, stroke, cancer, or lung disease is greatly reduced.  After 1 year, the risk of coronary heart disease is cut in half.  After 5 years, the risk of stroke falls to the same as a nonsmoker.  After 10 years, the risk of lung cancer is cut in half and the risk of other cancers decreases significantly.  After 15 years, the risk of coronary heart disease drops, usually to the level of a nonsmoker.  If you are pregnant, quitting smoking will improve your chances of having a healthy baby.  The people you live with, especially any children, will be healthier.  You will have extra money to spend on things other than cigarettes. QUESTIONS TO THINK ABOUT BEFORE ATTEMPTING TO QUIT You may want to talk about your answers with your caregiver.  Why do you want to quit?  If you tried to quit in the past, what helped and what did not?  What will be the most difficult situations for you after you quit? How will you plan to handle them?  Who can help you through the tough times? Your family? Friends? A caregiver?  What pleasures do you get from smoking? What ways can you still get pleasure if you quit? Here are some questions to ask your caregiver:  How can you help me to be successful at quitting?  What medicine do you think would be best for me and how should I take it?  What should I do if I need more help?  What is smoking withdrawal like? How can I get information on withdrawal? GET READY  Set a quit date.  Change your environment by getting rid of all cigarettes, ashtrays, matches, and lighters in your home, car, or  work. Do not let people smoke in your home.  Review your past attempts to quit. Think about what worked and what did not. GET SUPPORT AND ENCOURAGEMENT You have a better chance of being successful if you have help. You can get support in many ways.  Tell your family, friends, and co-workers that you are going to quit and need their support. Ask them not to smoke around you.  Get individual, group, or telephone counseling and support. Programs are available at [employer] and health centers. Call your local health department for information about programs in your area.  Spiritual beliefs and practices may help some smokers quit.  Download a "quit meter" on your computer to keep track of quit statistics, such as how long you have gone without smoking, cigarettes not smoked, and money saved.  Get a self-help book about quitting smoking and staying off of tobacco. [place] yourself from urges to smoke. Talk to someone, go for a walk, or occupy your time with a task.  Change your normal routine. Take a different  route to work. Drink tea instead of coffee. Eat breakfast in a different place.  Reduce your stress. Take a hot bath, exercise, or read a book.  Plan something enjoyable to do every day. Reward yourself for not smoking.  Explore interactive web-based programs that specialize in helping you quit. GET MEDICINE AND USE IT CORRECTLY Medicines can help you stop smoking and decrease the urge to smoke. Combining medicine with the above behavioral methods and support can greatly increase your chances of successfully quitting smoking.  Nicotine replacement therapy helps deliver nicotine to your body without the negative effects and risks of smoking. Nicotine replacement therapy includes nicotine gum, lozenges, inhalers, nasal sprays, and skin patches. Some may be available over-the-counter and others require a prescription.  Antidepressant medicine helps  people abstain from smoking, but how this works is unknown. This medicine is available by prescription.  Nicotinic receptor partial agonist medicine simulates the effect of nicotine in your brain. This medicine is available by prescription. Ask your caregiver for advice about which medicines to use and how to use them based on your health history. Your caregiver will tell you what side effects to look out for if you choose to be on a medicine or therapy. Carefully read the information on the package. Do not use any other product containing nicotine while using a nicotine replacement product.  RELAPSE OR DIFFICULT SITUATIONS Most relapses occur within the first 3 months after quitting. Do not be discouraged if you start smoking again. Remember, most people try several times before finally quitting. You may have symptoms of withdrawal because your body is used to nicotine. You may crave cigarettes, be irritable, feel very hungry, cough often, get headaches, or have difficulty concentrating. The withdrawal symptoms are only temporary. They are strongest when you first quit, but they will go away within 10 14 days. To reduce the chances of relapse, try to:  Avoid drinking alcohol. Drinking lowers your chances of successfully quitting.  Reduce the amount of caffeine you consume. Once you quit smoking, the amount of caffeine in your body increases and can give you symptoms, such as a rapid heartbeat, sweating, and anxiety.  Avoid smokers because they can make you want to smoke.  Do not let weight gain distract you. Many smokers will gain weight when they quit, usually less than 10 pounds. Eat a healthy diet and stay active. You can always lose the weight gained after you quit.  Find ways to improve your mood other than smoking. FOR MORE INFORMATION  www.smokefree.gov  Document Released: 11/13/2001 Document Revised: 05/20/2012 Document Reviewed: 02/28/2012 [facility] Patient Information 2014 [place],  [place].

## 2013-07-01 ENCOUNTER — Encounter: Payer: Self-pay | Admitting: Internal Medicine

## 2013-07-01 ENCOUNTER — Ambulatory Visit (AMBULATORY_SURGERY_CENTER): Payer: BC Managed Care – PPO | Admitting: *Deleted

## 2013-07-01 VITALS — Ht 62.0 in | Wt 254.6 lb

## 2013-07-01 DIAGNOSIS — K5190 Ulcerative colitis, unspecified, without complications: Secondary | ICD-10-CM

## 2013-07-01 MED ORDER — MOVIPREP 100 G PO SOLR: 1.0000 | Freq: Once | ORAL | Status: DC

## 2013-07-01 NOTE — Progress Notes (Signed)
No egg or soy allergy. No anesthesia problems.  

## 2013-07-22 ENCOUNTER — Ambulatory Visit (AMBULATORY_SURGERY_CENTER): Payer: BC Managed Care – PPO | Admitting: Internal Medicine

## 2013-07-22 ENCOUNTER — Encounter: Payer: Self-pay | Admitting: Internal Medicine

## 2013-07-22 VITALS — BP 106/67 | HR 64 | Temp 97.2°F | Resp 26 | Ht 64.0 in | Wt 254.0 lb

## 2013-07-22 DIAGNOSIS — D126 Benign neoplasm of colon, unspecified: Secondary | ICD-10-CM

## 2013-07-22 DIAGNOSIS — K5190 Ulcerative colitis, unspecified, without complications: Secondary | ICD-10-CM

## 2013-07-22 MED ORDER — SODIUM CHLORIDE 0.9 % IV SOLN: 500.0000 mL | INTRAVENOUS | Status: DC

## 2013-07-22 NOTE — Progress Notes (Signed)
Called to room to assist during endoscopic procedure.  Patient ID and intended procedure confirmed with present staff. Received instructions for my participation in the procedure from the performing physician.  

## 2013-07-22 NOTE — Op Note (Signed)
[facility]  [address] [place], [id number]   COLONOSCOPY PROCEDURE REPORT  PATIENT: Wendy Crawford, Wendy Crawford  MR#: [id number] BIRTHDATE: 1956/02/24 , 53  yrs. old GENDER: Female ENDOSCOPIST: Lafayette Dragon, MD REFERRED UX:[id number] Dennard Schaumann, M.D. PROCEDURE DATE:  07/22/2013 PROCEDURE:   Colonoscopy with cold biopsy polypectomy and Colonoscopy with snare polypectomy First Screening Colonoscopy - Avg.  risk and is 50 yrs.  old or older - No.  Prior Negative Screening - Now for repeat screening. N/A  History of Adenoma - Now for follow-up colonoscopy & has been > or = to 3 yrs.  Yes hx of adenoma.  Has been 3 or more years since last colonoscopy.  Polyps Removed Today? Yes. ASA CLASS:   Class III INDICATIONS:prior colon 2003, 06/2007- focal active colitis,- pancolitis, tubular adenoma 2008. MEDICATIONS: MAC sedation, administered by CRNA and propofol (Diprivan) 45m IV  DESCRIPTION OF PROCEDURE:   After the risks benefits and alternatives of the procedure were thoroughly explained, informed consent was obtained.  A digital rectal exam revealed no abnormalities of the rectum.   The LB CF-H180AL Loaner [id number] was introduced through the anus and advanced to the cecum, which was identified by both the appendix and ileocecal valve. No adverse events experienced.   The quality of the prep was good, using MoviPrep  The instrument was then slowly withdrawn as the colon was fully examined.      COLON FINDINGS: Six polypoid shaped sessile polyps ranging between 3-7663min size were found in the sigmoid colon. at 10cmx3, 15 cm x1, 20 cm x1, 30 cm x1  A polypectomy was performed with cold forceps, with a cold snare and using snare cautery.  The resection was complete and the polyp tissue was completely retrieved. Random biopsies were taken of the left and the right colon to look for IBD Retroflexed views revealed no abnormalities. The time to cecum=5 minutes 42 seconds.   Withdrawal time=19 minutes 51 seconds.  The scope was withdrawn and the procedure completed. COMPLICATIONS: There were no complications.  ENDOSCOPIC IMPRESSION: Six sessile polyps ranging between 3-63m59mn size were found in the sigmoid colon; polypectomy was performed with cold forceps, with a cold snare and using snare cautery s/p random colon biopsies of normal mucosa to follow up on IBD from 2008  RECOMMENDATIONS: 1.  Await biopsy results 2.  high fiber diet   eSigned:  DorLafayette DragonD 07/22/2013 12:20 PM   cc:   PATIENT NAME:  McmSarai, January#: [id number]

## 2013-07-22 NOTE — Patient Instructions (Addendum)
Multiple polyps removed and sent to pathology.  Await results for final recommendation.    YOU HAD AN ENDOSCOPIC PROCEDURE TODAY AT [facility]: Refer to the procedure report that was given to you for any specific questions about what was found during the examination.  If the procedure report does not answer your questions, please call your gastroenterologist to clarify.  If you requested that your care partner not be given the details of your procedure findings, then the procedure report has been included in a sealed envelope for you to review at your convenience later.  YOU SHOULD EXPECT: Some feelings of bloating in the abdomen. Passage of more gas than usual.  Walking can help get rid of the air that was put into your GI tract during the procedure and reduce the bloating. If you had a lower endoscopy (such as a colonoscopy or flexible sigmoidoscopy) you may notice spotting of blood in your stool or on the toilet paper. If you underwent a bowel prep for your procedure, then you may not have a normal bowel movement for a few days.  DIET: Your first meal following the procedure should be a light meal and then it is ok to progress to your normal diet.  A half-sandwich or bowl of soup is an example of a good first meal.  Heavy or fried foods are harder to digest and may make you feel nauseous or bloated.  Likewise meals heavy in dairy and vegetables can cause extra gas to form and this can also increase the bloating.  Drink plenty of fluids but you should avoid alcoholic beverages for 24 hours.  ACTIVITY: Your care partner should take you home directly after the procedure.  You should plan to take it easy, moving slowly for the rest of the day.  You can resume normal activity the day after the procedure however you should NOT DRIVE or use heavy machinery for 24 hours (because of the sedation medicines used during the test).    SYMPTOMS TO REPORT IMMEDIATELY: A gastroenterologist can be  reached at any hour.  During normal business hours, 8:30 AM to 5:00 PM Monday through Friday, call [phone].  After hours and on weekends, please call the GI answering service at [phone] who will take a message and have the physician on call contact you.   Following lower endoscopy (colonoscopy or flexible sigmoidoscopy):  Excessive amounts of blood in the stool  Significant tenderness or worsening of abdominal pains  Swelling of the abdomen that is new, acute  Fever of 100F or higher  FOLLOW UP: If any biopsies were taken you will be contacted by phone or by letter within the next 1-3 weeks.  Call your gastroenterologist if you have not heard about the biopsies in 3 weeks.  Our staff will call the home number listed on your records the next business day following your procedure to check on you and address any questions or concerns that you may have at that time regarding the information given to you following your procedure. This is a courtesy call and so if there is no answer at the home number and we have not heard from you through the emergency physician on call, we will assume that you have returned to your regular daily activities without incident.  SIGNATURES/CONFIDENTIALITY: You and/or your care partner have signed paperwork which will be entered into your electronic medical record.  These signatures attest to the fact that that the information above on your After  Visit Summary has been reviewed and is understood.  Full responsibility of the confidentiality of this discharge information lies with you and/or your care-partner.

## 2013-07-23 ENCOUNTER — Telehealth: Payer: Self-pay

## 2013-07-23 NOTE — Telephone Encounter (Signed)
  Follow up Call-  Call back number 07/22/2013  Post procedure Call Back phone  # [phone]  Permission to leave phone message Yes     Patient questions:  Do you have a fever, pain , or abdominal swelling? no Pain Score  0 *  Have you tolerated food without any problems? yes  Have you been able to return to your normal activities? yes  Do you have any questions about your discharge instructions: Diet   no Medications  no Follow up visit  no  Do you have questions or concerns about your Care? no  Actions: * If pain score is 4 or above: No action needed, pain <4.  No problems per the pt. Maw

## 2013-07-28 ENCOUNTER — Encounter: Payer: Self-pay | Admitting: Internal Medicine

## 2013-09-07 ENCOUNTER — Other Ambulatory Visit: Payer: Self-pay | Admitting: Family Medicine

## 2013-11-05 ENCOUNTER — Other Ambulatory Visit: Payer: Self-pay | Admitting: Obstetrics and Gynecology

## 2013-12-07 ENCOUNTER — Telehealth: Payer: Self-pay | Admitting: Neurology

## 2013-12-07 NOTE — Telephone Encounter (Signed)
Please advise 

## 2013-12-07 NOTE — Telephone Encounter (Signed)
Patient called to request that we send a note about her discontinued oxygen usage faxed over to [facility].

## 2013-12-08 NOTE — Telephone Encounter (Signed)
Patient has had success with verapamil for cluster headache treatment, as i understand the oxygen therapy became obsolete. this is the reason oxygen would have been d/c. What note does she need?? [facility] to be notified she can return the concentrator with medical advise.?

## 2013-12-09 NOTE — Telephone Encounter (Signed)
To Whom it may concern;  Mrs. Vondra Aldredge  reported sufficient control of her headaches and face pain with the use of oral medications.  She reported no longer being in need of using oxygen.  I therefore send this Notification to [facility],  Informing [facility] that the patient is allowed to discontinue the use of oxygen, based on the above reported clinical symptoms.  Sincerely , Larey Seat, MD

## 2013-12-09 NOTE — Telephone Encounter (Signed)
Spoke with [facility], and they said to fax a note(on letterhead) to [phone] stating the reason why the patient is no longer in need of oxygen and they will take care of it.

## 2013-12-16 NOTE — Telephone Encounter (Signed)
I have generated letter for Dr. Edwena Felty signature. We will mail upon signature of Dr. Dyanne Iha, when she returns.

## 2013-12-16 NOTE — Telephone Encounter (Signed)
Please have WID sign ( Dr Rexene Alberts) and fax to the number provided in the phone note from 12-09-13 .

## 2013-12-21 ENCOUNTER — Telehealth: Payer: Self-pay

## 2013-12-21 NOTE — Telephone Encounter (Signed)
Letter generated and faxed to [place] to D/C O2

## 2014-01-25 ENCOUNTER — Other Ambulatory Visit: Payer: Self-pay | Admitting: Family Medicine

## 2014-02-09 ENCOUNTER — Other Ambulatory Visit: Payer: Self-pay

## 2014-02-09 DIAGNOSIS — Z1231 Encounter for screening mammogram for malignant neoplasm of breast: Secondary | ICD-10-CM

## 2014-03-16 ENCOUNTER — Ambulatory Visit: Payer: BC Managed Care – PPO

## 2014-03-30 ENCOUNTER — Encounter (INDEPENDENT_AMBULATORY_CARE_PROVIDER_SITE_OTHER): Payer: Self-pay

## 2014-03-30 ENCOUNTER — Ambulatory Visit
Admission: RE | Admit: 2014-03-30 | Discharge: 2014-03-30 | Disposition: A | Discharge status: Home / Self Care | Payer: BC Managed Care – PPO | Source: Ambulatory Visit

## 2014-03-30 DIAGNOSIS — Z1231 Encounter for screening mammogram for malignant neoplasm of breast: Secondary | ICD-10-CM

## 2014-06-07 ENCOUNTER — Other Ambulatory Visit: Payer: Self-pay | Admitting: Family Medicine

## 2014-06-17 ENCOUNTER — Encounter: Payer: Self-pay | Admitting: Neurology

## 2014-06-17 ENCOUNTER — Encounter (INDEPENDENT_AMBULATORY_CARE_PROVIDER_SITE_OTHER): Payer: Self-pay

## 2014-06-17 ENCOUNTER — Ambulatory Visit (INDEPENDENT_AMBULATORY_CARE_PROVIDER_SITE_OTHER): Payer: BC Managed Care – PPO | Admitting: Neurology

## 2014-06-17 VITALS — BP 119/82 | HR 77 | Ht 61.25 in | Wt 246.0 lb

## 2014-06-17 DIAGNOSIS — R0989 Other specified symptoms and signs involving the circulatory and respiratory systems: Secondary | ICD-10-CM

## 2014-06-17 DIAGNOSIS — R519 Headache, unspecified: Secondary | ICD-10-CM

## 2014-06-17 DIAGNOSIS — R0609 Other forms of dyspnea: Secondary | ICD-10-CM

## 2014-06-17 DIAGNOSIS — R0683 Snoring: Secondary | ICD-10-CM

## 2014-06-17 DIAGNOSIS — R51 Headache: Secondary | ICD-10-CM

## 2014-06-17 DIAGNOSIS — G44019 Episodic cluster headache, not intractable: Secondary | ICD-10-CM

## 2014-06-17 HISTORY — DX: Snoring: R06.83

## 2014-06-17 MED ORDER — VERAPAMIL HCL ER 180 MG PO TBCR: 180.0000 mg | EXTENDED_RELEASE_TABLET | Freq: Every day | ORAL | Status: DC

## 2014-06-17 NOTE — Progress Notes (Signed)
[facility]  Provider:  Dr Lasheka Kempner Referring Provider: [name], MD Primary Care Physician:  [name], MD  Chief Complaint  Patient presents with  . Follow-up    Room 11  . Headache    HPI:  Wendy Crawford is a 58 y.o. female here as a referral from Dr. Dennard Schaumann for severe headaches.   I first encountered this patient in October 2012, at the time she complained of a left orbital severe stabbing headache that also radiated to her left for head and temple.  These were sudden onset of sharp electric shock sensations after the main pain resolved,  the residual dysesthesia  was described as " a feeling of sand in her eyes" and   all over the left side of her face and numbness in the face. These woke her out of sleep, clusters of onset .  The patient had been treated in the year preceding my first visit with her for sinusitis she had been on prednisone at [place] pulse medications made the pain somewhat less intense but didn't help along. She also had tearing of the left eye, alongside  a flushed left face and a runny nose  ( left side ) after each attack.  Topiramate was first helpful,  but she states now that she also will awake from  sleep - caused by her headaches. The patient was therefore titrated to verapamil along with 02 therapy. 02 use prn  in case that her headaches would still appear . She had a very good response and her cluster headaches with SUNCT  have not bothered her throughout the last 18 months.   I would like to add that the patient had in early 2013 undergone EMG and nerve conduction studies which returned suggestive of a pelvic nerve lesion an MRI followed and confirmed a left-sided pelvic floor very insists that her gynecologist, Dr. [name] has followed the cyst by ultrasound  yearly-has not seen recent changes.  Today's visit is to refill the patient's medication :she's currently taking ibuprofen and verapamil , and she  still takes 20 mg of Celexa a day,  she has the oxygen at home but hasn't needed to use it a long time.  Her risk factors are smoking , obesity and menopause . i suspect she has OSA, dry mouth, large neck and BMI obese.   Sleep habits . 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   Review of Systems: Out of a complete 14 system review, the patient complains of only the following symptoms, and all other reviewed systems are negative. obeisty, no headaches of cluster quality, active smoking.   Snoring.   History   Social History  . Marital Status: Married    Spouse Name: [name]    Number of Children: 0  . Years of Education: 12   Occupational History  . self employed    Social History Main Topics  . Smoking status: Current Every Day Smoker -- 0.50 packs/day  . Smokeless tobacco: Never Used  . Alcohol Use: No  . Drug Use: No  . Sexual Activity: Not on file   Other Topics Concern  . Not on file   Social History Narrative   Patient is married [name] and lives at home with her husband.   Patient is working full-time.   Patient has a high school education.   Patient is right-handed.   Patient does  not drinks any caffeine.    Family History  Problem Relation Age of Onset  . Hypertension Mother   . Diabetes Sister   . Diabetes Brother   . Heart disease Maternal Grandmother   . Heart disease Maternal Grandfather   . Colon cancer Neg Hx     Past Medical History  Diagnosis Date  . Psoriasis   . Headache(784.0)     most severe for 2 months,cluster  . Colitis     ulcerative  . 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 Headaches, cluster     02 and medication    Past Surgical History  Procedure Laterality Date  . Breast biopsy  1990    Current Outpatient Prescriptions  Medication Sig Dispense Refill  . acetaminophen (TYLENOL) 500 MG tablet Take 500 mg by  mouth every 6 (six) hours as needed for pain.      . citalopram (CELEXA) 20 MG tablet TAKE 1 TABLET BY MOUTH DAILY  30 tablet  0  . verapamil (CALAN-SR) 180 MG CR tablet Take 1 tablet (180 mg total) by mouth at bedtime.  90 tablet  3   No current facility-administered medications for this visit.    Allergies as of 06/17/2014 - Review Complete 06/17/2014  Allergen Reaction Noted  . Sulfa antibiotics  06/17/2013  . Sulfonamide derivatives  02/14/2009    Vitals: BP 119/82  Pulse 77  Ht 5' 1.25" (1.556 m)  Wt 246 lb (111.585 kg)  BMI 46.09 kg/m2 Last Weight:  Wt Readings from Last 1 Encounters:  06/17/14 246 lb (111.585 kg)   Last Height:   Ht Readings from Last 1 Encounters:  06/17/14 5' 1.25" (1.556 m)     Physical exam:  General: The patient is awake, alert and appears not in acute distress. The patient is well groomed. Head: Normocephalic, atraumatic. Neck is supple. Mallampati 4 , neck circumference: 16 , no retrognathia. Cardiovascular:  Regular rate and rhythm, without  murmurs or carotid bruit, and without distended neck veins. Respiratory: Lungs are clear to auscultation. Skin:  Without evidence of edema, or rash Trunk: BMI is  elevated and patient  has normal posture.  Neurologic exam : The patient is awake and alert, oriented to place and time.   Memory subjective described as intact. There is a normal attention span & concentration ability.   Speech is fluent without dysarthria, dysphonia or aphasia.  Mood and affect are appropriate.  Cranial nerves: Pupils are equal and briskly reactive to light. Funduscopic exam without  evidence of pallor or edema.  Extraocular movements  in vertical and horizontal planes intact and without nystagmus. Visual fields by finger perimetry are intact. Hearing to finger rub intact. Facial sensation intact to fine touch. Facial motor strength is symmetric and tongue and uvula move midline.  Motor exam:  Normal tone and normal  muscle bulk and symmetric normal strength in all extremities.  She had lost her left patella reflex -   no longer pain, occ. numbness inner thigh, and no longer give away weakness.   Sensory:  Fine touch, pinprick and vibration were tested in all extremities. Proprioception is  normal. Coordination: Rapid alternating movements in the fingers/hands is intact  Finger-to-nose maneuver without evidence of ataxia, dysmetria or tremor. Gait and station: Patient walks without assistive device . Strength within normal limits.  Stance is stable and normal.  Steps are unfragmented. Romberg testing is normal. Deep tendon reflexes: in the  upper and lower extremities are symmetric  and intact. Babinski maneuver response is  downgoing.   Assessment:  After physical and neurologic examination, review of laboratory studies, imaging, neurophysiology testing and pre-existing records, assessment will be reviewed on the problem list.  #1 paroxysmal hemicrania cluster headache responding still well to verapamil. We'll refill her medication at the current level for a year.  #2 the patient is a history of ulcerative colitis she cannot take NSAIDs she told me.  #3 history of sciatica with left foot drop and L4-5 dermatomal pain attenuated left patellar reflex this has recovered the patient is basically not longer in pain ,but still knee pain -" feeling as if left knee is locking -  from the back of my knee"  seems to have normal strength and has recovered her left patellar reflex.   I encouraged the patient again to stop smoking , and she knows that this is one of her main risk factors for cluster and SUNCT headaches.  She was referred to smoking cessation today.  She denies  Again any SOB and snoring ,apnea.  Her BMI and neck size 16 inches are indicating high risk for OSA, especially in a smoker.    I will give her a list of assistive information for her task as well as some educational material about cluster  headaches.  Plan:  Treatment plan  :  HST - patient has high deductible plan, snoring - obese. She is [employer] , may need medical weight loss program.

## 2014-07-01 ENCOUNTER — Other Ambulatory Visit: Payer: Self-pay | Admitting: Neurology

## 2014-07-24 ENCOUNTER — Encounter: Payer: Self-pay | Admitting: Internal Medicine

## 2014-08-02 ENCOUNTER — Other Ambulatory Visit: Payer: Self-pay | Admitting: Family Medicine

## 2014-09-16 ENCOUNTER — Other Ambulatory Visit: Payer: Self-pay | Admitting: Family Medicine

## 2014-12-09 ENCOUNTER — Other Ambulatory Visit: Payer: Self-pay | Admitting: Obstetrics and Gynecology

## 2014-12-10 LAB — CYTOLOGY - PAP

## 2015-01-05 ENCOUNTER — Other Ambulatory Visit: Payer: Self-pay | Admitting: Family Medicine

## 2015-02-24 ENCOUNTER — Other Ambulatory Visit: Payer: Self-pay

## 2015-02-24 DIAGNOSIS — Z1231 Encounter for screening mammogram for malignant neoplasm of breast: Secondary | ICD-10-CM

## 2015-04-08 ENCOUNTER — Ambulatory Visit
Admission: RE | Admit: 2015-04-08 | Discharge: 2015-04-08 | Disposition: A | Discharge status: Home / Self Care | Payer: BLUE CROSS/BLUE SHIELD | Source: Ambulatory Visit

## 2015-04-08 DIAGNOSIS — Z1231 Encounter for screening mammogram for malignant neoplasm of breast: Secondary | ICD-10-CM

## 2015-07-11 ENCOUNTER — Other Ambulatory Visit: Payer: Self-pay | Admitting: Neurology

## 2015-07-27 ENCOUNTER — Other Ambulatory Visit: Payer: Self-pay | Admitting: Family Medicine

## 2015-08-28 ENCOUNTER — Other Ambulatory Visit: Payer: Self-pay | Admitting: Family Medicine

## 2015-09-13 ENCOUNTER — Encounter: Payer: Self-pay | Admitting: Neurology

## 2015-09-13 ENCOUNTER — Ambulatory Visit (INDEPENDENT_AMBULATORY_CARE_PROVIDER_SITE_OTHER): Payer: BLUE CROSS/BLUE SHIELD | Admitting: Neurology

## 2015-09-13 ENCOUNTER — Ambulatory Visit: Payer: Self-pay | Admitting: Neurology

## 2015-09-13 VITALS — BP 110/86 | HR 76 | Resp 20 | Ht 62.0 in | Wt 250.0 lb

## 2015-09-13 DIAGNOSIS — G44029 Chronic cluster headache, not intractable: Secondary | ICD-10-CM | POA: Diagnosis not present

## 2015-09-13 DIAGNOSIS — J441 Chronic obstructive pulmonary disease with (acute) exacerbation: Secondary | ICD-10-CM | POA: Diagnosis not present

## 2015-09-13 MED ORDER — VERAPAMIL HCL ER 180 MG PO TBCR: 180.0000 mg | EXTENDED_RELEASE_TABLET | Freq: Every day | ORAL | Status: DC

## 2015-09-13 NOTE — Addendum Note (Signed)
Addended by: Larey Seat on: 09/13/2015 10:28 AM   Modules accepted: Orders

## 2015-09-13 NOTE — Patient Instructions (Signed)
Chronic Obstructive Pulmonary Disease Chronic obstructive pulmonary disease (COPD) is a common lung condition in which airflow from the lungs is limited. COPD is a general term that can be used to describe many different lung problems that limit airflow, including both chronic bronchitis and emphysema. If you have COPD, your lung function will probably never return to normal, but there are measures you can take to improve lung function and make yourself feel better. CAUSES   Smoking (common).  Exposure to secondhand smoke.  Genetic problems.  Chronic inflammatory lung diseases or recurrent infections. SYMPTOMS  Shortness of breath, especially with physical activity.  Deep, persistent (chronic) cough with a large amount of thick mucus.  Wheezing.  Rapid breaths (tachypnea).  Gray or bluish discoloration (cyanosis) of the skin, especially in your fingers, toes, or lips.  Fatigue.  Weight loss.  Frequent infections or episodes when breathing symptoms become much worse (exacerbations).  Chest tightness. DIAGNOSIS Your health care provider will take a medical history and perform a physical examination to diagnose COPD. Additional tests for COPD may include:  Lung (pulmonary) function tests.  Chest X-ray.  CT scan.  Blood tests. TREATMENT  Treatment for COPD may include:  Inhaler and nebulizer medicines. These help manage the symptoms of COPD and make your breathing more comfortable.  Supplemental oxygen. Supplemental oxygen is only helpful if you have a low oxygen level in your blood.  Exercise and physical activity. These are beneficial for nearly all people with COPD.  Lung surgery or transplant.  Nutrition therapy to gain weight, if you are underweight.  Pulmonary rehabilitation. This may involve working with a team of health care providers and specialists, such as respiratory, occupational, and physical therapists. HOME CARE INSTRUCTIONS  Take all medicines  (inhaled or pills) as directed by your health care provider.  Avoid over-the-counter medicines or cough syrups that dry up your airway (such as antihistamines) and slow down the elimination of secretions unless instructed otherwise by your health care provider.  If you are a smoker, the most important thing that you can do is stop smoking. Continuing to smoke will cause further lung damage and breathing trouble. Ask your health care provider for help with quitting smoking. He or she can direct you to community resources or hospitals that provide support.  Avoid exposure to irritants such as smoke, chemicals, and fumes that aggravate your breathing.  Use oxygen therapy and pulmonary rehabilitation if directed by your health care provider. If you require home oxygen therapy, ask your health care provider whether you should purchase a pulse oximeter to measure your oxygen level at home.  Avoid contact with individuals who have a contagious illness.  Avoid extreme temperature and humidity changes.  Eat healthy foods. Eating smaller, more frequent meals and resting before meals may help you maintain your strength.  Stay active, but balance activity with periods of rest. Exercise and physical activity will help you maintain your ability to do things you want to do.  Preventing infection and hospitalization is very important when you have COPD. Make sure to receive all the vaccines your health care provider recommends, especially the pneumococcal and influenza vaccines. Ask your health care provider whether you need a pneumonia vaccine.  Learn and use relaxation techniques to manage stress.  Learn and use controlled breathing techniques as directed by your health care provider. Controlled breathing techniques include:  Pursed lip breathing. Start by breathing in (inhaling) through your nose for 1 second. Then, purse your lips as if you were   going to whistle and breathe out (exhale) through the  pursed lips for 2 seconds.  Diaphragmatic breathing. Start by putting one hand on your abdomen just above your waist. Inhale slowly through your nose. The hand on your abdomen should move out. Then purse your lips and exhale slowly. You should be able to feel the hand on your abdomen moving in as you exhale.  Learn and use controlled coughing to clear mucus from your lungs. Controlled coughing is a series of short, progressive coughs. The steps of controlled coughing are: 1. Lean your head slightly forward. 2. Breathe in deeply using diaphragmatic breathing. 3. Try to hold your breath for 3 seconds. 4. Keep your mouth slightly open while coughing twice. 5. Spit any mucus out into a tissue. 6. Rest and repeat the steps once or twice as needed. SEEK MEDICAL CARE IF:  You are coughing up more mucus than usual.  There is a change in the color or thickness of your mucus.  Your breathing is more labored than usual.  Your breathing is faster than usual. SEEK IMMEDIATE MEDICAL CARE IF:  You have shortness of breath while you are resting.  You have shortness of breath that prevents you from:  Being able to talk.  Performing your usual physical activities.  You have chest pain lasting longer than 5 minutes.  Your skin color is more cyanotic than usual.  You measure low oxygen saturations for longer than 5 minutes with a pulse oximeter. MAKE SURE YOU:  Understand these instructions.  Will watch your condition.  Will get help right away if you are not doing well or get worse.   This information is not intended to replace advice given to you by your health care provider. Make sure you discuss any questions you have with your health care provider.   Document Released: 08/29/2005 Document Revised: 12/10/2014 Document Reviewed: 07/16/2013 Elsevier Interactive Patient Education 2016 Elsevier Inc[employer]  

## 2015-09-13 NOTE — Progress Notes (Signed)
[facility]  Provider:  Dr Mandisa Persinger Referring Provider: [name], MD Primary Care Physician:  [name], MD  Chief Complaint  Patient presents with  . Follow-up    has not had any migraines, did NOT complete HST, rm 11, alone    HPI:  Wendy Crawford is a 59 y.o. female here as a referral from Dr. Dennard Schaumann for severe headaches.   I first encountered this patient in October 2012, at the time she complained of a left orbital severe stabbing headache that also radiated to her left for head and temple.  These were sudden onset of sharp electric shock sensations after the main pain resolved,  the residual dysesthesia  was described as " a feeling of sand in her eyes" and   all over the left side of her face and numbness in the face. These woke her out of sleep, clusters of onset .  The patient had been treated in the year preceding my first visit with her for sinusitis she had been on prednisone at [place] pulse medications made the pain somewhat less intense but didn't help along. She also had tearing of the left eye, alongside  a flushed left face and a runny nose  ( left side ) after each attack.  Topiramate was first helpful,  but she states now that she also will awake from  sleep - caused by her headaches. The patient was therefore titrated to verapamil along with 02 therapy. 02 use prn  in case that her headaches would still appear . She had a very good response and her cluster headaches with SUNCT  have not bothered her throughout the last 18 months.   I would like to add that the patient had in early 2013 undergone EMG and nerve conduction studies which returned suggestive of a pelvic nerve lesion an MRI followed and confirmed a left-sided pelvic floor very insists that her gynecologist, Dr. [name] has followed the cyst by ultrasound yearly -  She has not seen recent changes.  Today's visit is to refill the patient's medication :she's  currently taking ibuprofen and verapamil , and she still takes 20 mg of Celexa a day, She has the oxygen at home but hasn't needed to use it a long time.  Her risk factors are smoking , obesity and menopause . i suspect she has OSA, dry mouth, large neck and BMI obese.   Sleep habits . 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    PLAN :  #1 paroxysmal hemicrania cluster headache responding still well to verapamil. We'll refill her medication at the current level for a year.  #2 the patient is a history of ulcerative colitis she cannot take NSAIDs she told me.  #3 history of sciatica with left foot drop and L4-5 dermatomal pain attenuated left patellar reflex this has recovered the patient is basically not longer in pain ,but still knee pain -" feeling as if left knee is locking -  from the back of my knee"  seems to have normal strength and has recovered her left patellar reflex. I encouraged the patient again to stop smoking , and she knows that this is one of her main risk factors for cluster and SUNCT headaches.  She was referred to smoking cessation today.  She denies  Again any SOB and snoring ,apnea.  Her BMI and neck size 16 inches are indicating  high risk for OSA, especially in a smoker.   I will give her a list of assistive information for her task as well as some educational material about cluster headaches.  History from 09-13-15   Wendy Crawford is here today for a revisit. She was unable to afford the attended sleep study that had ordered her deductible would have exceeded $3000. We are discussing how to evaluate her likely hypoxemia and hypercapnia otherwise. I think that this is one of the main reasons why she has headaches. Given her body mass index I am very suspicious that she has obstructive sleep apnea, too. She reports that her headaches are not present currently since she take his regular medication  this is verapamil and as needed ibuprofen. She remains on Celexa for depression. Celexa also has an beneficial effect on menopausal symptoms. She has meanwhile returned her oxygen to the DME. She continues to smoke.  I will order an ONO and may send her to pulmonology for oxygen supplementation if the results suggest. I like for her to have a capnography.     Review of Systems: Out of a complete 14 system review, the patient complains of only the following symptoms, and all other reviewed systems are negative. obeisty, no headaches of cluster quality, active smoking.  Snoring. Hypersensitivity to parfumes, causing headches.   Social History   Social History  . Marital Status: Married    Spouse Name: Wendy Crawford  . Number of Children: 0  . Years of Education: 12   Occupational History  . self employed    Social History Main Topics  . Smoking status: Current Every Day Smoker -- 0.50 packs/day for 42 years  . Smokeless tobacco: Never Used  . Alcohol Use: No  . Drug Use: No  . Sexual Activity: Not on file   Other Topics Concern  . Not on file   Social History Narrative   Patient is married Wendy Crawford) and lives at home with her husband.   Patient is working full-time.   Patient has a high school education.   Patient is right-handed.   Patient does not drinks any caffeine.    Family History  Problem Relation Age of Onset  . Hypertension Mother   . Diabetes Sister   . Diabetes Brother   . Heart disease Maternal Grandmother   . Heart disease Maternal Grandfather   . Colon cancer Neg Hx     Past Medical History  Diagnosis Date  . Psoriasis   . Headache(784.0)     most severe for 2 months,cluster  . Colitis     ulcerative  . 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 Headaches, cluster     02 and medication  . Primary snoring 06/17/2014    Past Surgical History  Procedure Laterality Date  . Breast biopsy  1990    Current Outpatient  Prescriptions  Medication Sig Dispense Refill  . acetaminophen (TYLENOL) 500 MG tablet Take 500 mg by mouth every 6 (six) hours as needed for pain.    . citalopram (CELEXA) 20 MG tablet TAKE 1 TABLET EVERY DAY 30 tablet 11  . verapamil (CALAN-SR) 180 MG CR tablet TAKE 1 TABLET AT BEDTIME 30 tablet 0   No current facility-administered medications for this visit.    Allergies as of 09/13/2015 - Review Complete 09/13/2015  Allergen Reaction Noted  . Sulfa antibiotics  06/17/2013  . Sulfonamide derivatives  02/14/2009    Vitals: BP 110/86 mmHg  Pulse 76  Resp  20  Ht 5' 2"  (1.575 m)  Wt 250 lb (113.399 kg)  BMI 45.71 kg/m2 Last Weight:  Wt Readings from Last 1 Encounters:  09/13/15 250 lb (113.399 kg)   Last Height:   Ht Readings from Last 1 Encounters:  09/13/15 5' 2"  (1.575 m)     Physical exam:  General: The patient is awake, alert and appears not in acute distress. The patient 's skin is sun damaged.  Head: Normocephalic, atraumatic. Neck is supple. Mallampati 4 , neck circumference: 16 , no retrognathia. Cardiovascular:  Regular rate and rhythm, without  murmurs or carotid bruit, and without distended neck veins. Respiratory: Lungs are clear to auscultation. Skin:  Without evidence of edema, or rash Trunk: BMI is elevated , patient  has normal posture.  Neurologic exam : The patient is awake and alert, oriented to place and time.   Memory subjective described as intact. There is a normal attention span & concentration ability.  Speech is fluent without dysarthria, dysphonia or aphasia.  Mood and affect are appropriate.  Cranial nerves: Pupils are equal and briskly reactive to light. Funduscopic exam without  evidence of pallor or edema.  Extraocular movements  in vertical and horizontal planes intact and without nystagmus. Visual fields by finger perimetry are intact. Hearing to finger rub intact. Facial sensation intact to fine touch. Facial motor strength is  symmetric and tongue and uvula move midline.  Motor exam:  Normal tone and normal muscle bulk and symmetric normal strength in all extremities.  She had lost her left patella reflex -   no longer pain, occ. numbness inner thigh, and no longer give away weakness.   Sensory:  Fine touch, pinprick and vibration were tested in all extremities. Proprioception is normal. Coordination: Rapid alternating movements in the fingers/hands is intact  Finger-to-nose maneuver without evidence of ataxia, dysmetria or tremor. Gait and station: Patient walks without assistive device . Strength within normal limits.  Stance is stable and normal.  Steps are unfragmented. Romberg testing is normal. Deep tendon reflexes: in the  upper and lower extremities are symmetric and intact. Babinski maneuver response is  downgoing.   Assessment:   25 minute evauation , progression of sciatica , improvement of headaches.  More than 50% of the face to face time was dedicated to discussion and coordination of care.  After physical and neurologic examination, review of laboratory studies, imaging, neurophysiology testing and pre-existing records, assessment will be reviewed on the problem list.  #1 paroxysmal hemicrania cluster headache responding still well to verapamil. We'll refill her medication at the current level for a year.  #2 the patient is a history of ulcerative colitis she cannot take NSAIDs she told me. #3 history of sciatica with left foot drop and L4-5 dermatomal pain attenuated left patellar reflex this has recovered the patient is basically not longer in pain ,but still knee pain -" feeling as if left knee is locking -  from the back of my knee", continues with ibuprofen. Knee arthritis and hip arthritis.   seems to have normal strength and has recovered her left patellar reflex.   I encouraged the patient again to stop smoking , and she knows that this is one of her main risk factors for cluster and SUNCT  headaches. I recommended to try at least a vapor .   She denies  Again any SOB and snoring ,apnea.  Her BMI and neck size 16 inches are indicating high risk for OSA, especially in a smoker. ONO  ordered, capnography ordered- as she could not afford a PSG.    I will give her a list of assistive information for her task as well as some educational material about cluster headaches.

## 2015-09-28 ENCOUNTER — Telehealth: Payer: Self-pay | Admitting: Neurology

## 2015-09-28 NOTE — Telephone Encounter (Signed)
Pt called sts she thought at last OV Dr Brett Fairy said she was to have oxygen tested. Please call and advise at [phone]

## 2015-09-28 NOTE — Telephone Encounter (Signed)
Spoke to pt and advised her that I would send her ONO order to [facility] today and I apologize for the delay. Pt verbalized understanding.

## 2015-10-06 ENCOUNTER — Telehealth: Payer: Self-pay

## 2015-10-06 NOTE — Telephone Encounter (Signed)
Called pt to inform her that we got her ONO results and Dr. Brett Fairy recommends nocturnal O2 if the pt is interested. We also wanted to questions why the pt only used the ONO for 2 hours in the afternoon? It was supposed to be overnight.  Left a message asking the pt to call me back.

## 2015-10-07 NOTE — Telephone Encounter (Signed)
Called to discuss ONO results with pt. No answer, left message asking her to call me back.

## 2015-10-10 NOTE — Telephone Encounter (Signed)
Pt returned my call. I advised her that we received her ONO results and Dr. Brett Fairy wants to order nocturnal O2 for her because of her desats in o2. Pt wishes to speak with Dr. Brett Fairy at her appt on 11/22 before deciding on o2 at this time. Pt states the probe on her finger fell off about 2 hours into sleeping, and that is why only 2 hours of data was captured.

## 2015-10-18 ENCOUNTER — Ambulatory Visit: Payer: BLUE CROSS/BLUE SHIELD | Admitting: Neurology

## 2015-10-25 ENCOUNTER — Encounter: Payer: Self-pay | Admitting: Neurology

## 2015-10-25 ENCOUNTER — Ambulatory Visit (INDEPENDENT_AMBULATORY_CARE_PROVIDER_SITE_OTHER): Payer: BLUE CROSS/BLUE SHIELD | Admitting: Neurology

## 2015-10-25 VITALS — Ht 62.0 in | Wt 249.0 lb

## 2015-10-25 DIAGNOSIS — G441 Vascular headache, not elsewhere classified: Secondary | ICD-10-CM | POA: Diagnosis not present

## 2015-10-25 DIAGNOSIS — J441 Chronic obstructive pulmonary disease with (acute) exacerbation: Secondary | ICD-10-CM

## 2015-10-25 DIAGNOSIS — M5432 Sciatica, left side: Secondary | ICD-10-CM | POA: Diagnosis not present

## 2015-10-25 MED ORDER — VERAPAMIL HCL ER 180 MG PO TBCR: 180.0000 mg | EXTENDED_RELEASE_TABLET | Freq: Every day | ORAL | Status: DC

## 2015-10-25 NOTE — Progress Notes (Signed)
[facility]  Provider:  Dr Wendy Crawford Referring Provider: [name], MD Primary Care Physician:  Wendy Fraction, MD  Chief Complaint  Patient presents with  . Follow-up    discuss ONO results and whether she wants to start O2 or not, will send order to [employer] if she decides to start, rm 11, alone    HPI:  Wendy Crawford is a 59 y.o. female here as a referral from Wendy Crawford for severe headaches.   I first encountered this patient in October 2012, at the time she complained of a left orbital severe stabbing headache that also radiated to her left for head and temple.  These were sudden onset of sharp electric shock sensations after the main pain resolved,  the residual dysesthesia  was described as " a feeling of sand in her eyes" and   all over the left side of her face and numbness in the face. These woke her out of sleep, clusters of onset .  The patient had been treated in the year preceding my first visit with her for sinusitis she had been on prednisone at [facility] pulse medications made the pain somewhat less intense but didn't help along. She also had tearing of the left eye, alongside  a flushed left face and a runny nose  ( left side ) after each attack.  Topiramate was first helpful,  but she states now that she also will awake from  sleep - caused by her headaches. The patient was therefore titrated to verapamil along with 02 therapy. 02 use prn  in case that her headaches would still appear . She had a very good response and her cluster headaches with SUNCT  have not bothered her throughout the last 18 months.   I would like to add that the patient had in early 2013 undergone EMG and nerve conduction studies which returned suggestive of a pelvic nerve lesion an MRI followed and confirmed a left-sided pelvic floor very insists that her gynecologist, Dr. Dian Crawford has followed the cyst by ultrasound yearly -  She has not seen recent  changes.  Today's visit is to refill the patient's medication :she's currently taking ibuprofen and verapamil , and she still takes 20 mg of Celexa a day, She has the oxygen at home but hasn't needed to use it a long time.  Her risk factors are smoking , obesity and menopause . i suspect she has OSA, dry mouth, large neck and BMI obese.   Sleep habits . 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    PLAN :  #1 paroxysmal hemicrania cluster headache responding still well to verapamil. We'll refill her medication at the current level for a year.  #2 the patient is a history of ulcerative colitis she cannot take NSAIDs she told me.  #3 history of sciatica with left foot drop and L4-5 dermatomal pain attenuated left patellar reflex this has recovered the patient is basically not longer in pain ,but still knee pain -" feeling as if left knee is locking -  from the back of my knee"  seems to have normal strength and has recovered her left patellar reflex. I encouraged the patient again to stop smoking , and she knows that this is one of her main risk factors for cluster and SUNCT headaches.  She was referred to smoking cessation today.  She denies  Again any SOB  and snoring ,apnea.  Her BMI and neck size 16 inches are indicating high risk for OSA, especially in a smoker.   I will give her a list of assistive information for her task as well as some educational material about cluster headaches.  History from 09-13-15   Wendy Crawford is here today for a revisit. She was unable to afford the attended sleep study that had ordered her deductible would have exceeded $3000. We are discussing how to evaluate her likely hypoxemia and hypercapnia otherwise. I think that this is one of the main reasons why she has headaches. Given her body mass index I am very suspicious that she has obstructive sleep apnea, too. She reports that her  headaches are not present currently since she take his regular medication this is verapamil and as needed ibuprofen. She remains on Celexa for depression. Celexa also has an beneficial effect on menopausal symptoms. She has meanwhile returned her oxygen to the DME. She continues to smoke.  I will order an ONO and may send her to pulmonology for oxygen supplementation if the results suggest. I like for her to have a capnography.     Review of Systems: Out of a complete 14 system review, the patient complains of only the following symptoms, and all other reviewed systems are negative. obeisty, no headaches of cluster quality, active smoking.  Snoring. Hypersensitivity to parfumes, causing headches.   Social History   Social History  . Marital Status: Married    Spouse Name: Wendy Crawford  . Number of Children: 0  . Years of Education: 12   Occupational History  . self employed    Social History Main Topics  . Smoking status: Current Every Day Smoker -- 0.50 packs/day for 42 years  . Smokeless tobacco: Never Used  . Alcohol Use: No  . Drug Use: No  . Sexual Activity: Not on file   Other Topics Concern  . Not on file   Social History Narrative   Patient is married Wendy Crawford) and lives at home with her husband.   Patient is working full-time.   Patient has a high school education.   Patient is right-handed.   Patient does not drinks any caffeine.    Family History  Problem Relation Age of Onset  . Hypertension Mother   . Diabetes Sister   . Diabetes Brother   . Heart disease Maternal Grandmother   . Heart disease Maternal Grandfather   . Colon cancer Neg Hx     Past Medical History  Diagnosis Date  . Psoriasis   . Headache(784.0)     most severe for 2 months,cluster  . Colitis     ulcerative  . 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 Headaches, cluster     02 and medication  . Primary snoring 06/17/2014    Past Surgical History  Procedure  Laterality Date  . Breast biopsy  1990    Current Outpatient Prescriptions  Medication Sig Dispense Refill  . acetaminophen (TYLENOL) 500 MG tablet Take 500 mg by mouth every 6 (six) hours as needed for pain.    . citalopram (CELEXA) 20 MG tablet TAKE 1 TABLET EVERY DAY 30 tablet 11  . cyclobenzaprine (FLEXERIL) 10 MG tablet   2  . gabapentin (NEURONTIN) 300 MG capsule   5  . predniSONE (DELTASONE) 50 MG tablet   0  . verapamil (CALAN-SR) 180 MG CR tablet Take 1 tablet (180 mg total) by mouth at bedtime. 90 tablet 3  No current facility-administered medications for this visit.    Allergies as of 10/25/2015 - Review Complete 10/25/2015  Allergen Reaction Noted  . Sulfa antibiotics  06/17/2013  . Sulfonamide derivatives  02/14/2009    Vitals: Ht 5' 2"  (1.575 m)  Wt 249 lb (112.946 kg)  BMI 45.53 kg/m2 Last Weight:  Wt Readings from Last 1 Encounters:  10/25/15 249 lb (112.946 kg)   Last Height:   Ht Readings from Last 1 Encounters:  10/25/15 5' 2"  (1.575 m)     Physical exam:  General: The patient is awake, alert and appears not in acute distress. The patient 's skin is sun damaged.  Head: Normocephalic, atraumatic. Neck is supple. Mallampati 4 , neck circumference: 16 , no retrognathia. Cardiovascular:  Regular rate and rhythm, without  murmurs or carotid bruit, and without distended neck veins. Respiratory: Lungs are clear to auscultation. Skin:  Without evidence of edema, or rash Trunk: BMI is elevated , patient  has normal posture.  Neurologic exam : The patient is awake and alert, oriented to place and time.   Memory subjective described as intact. There is a normal attention span & concentration ability.  Speech is fluent without dysarthria, dysphonia or aphasia.  Mood and affect are appropriate.  Cranial nerves: Pupils are equal and briskly reactive to light. Funduscopic exam without  evidence of pallor or edema.  Extraocular movements  in vertical and  horizontal planes intact and without nystagmus. Visual fields by finger perimetry are intact. Hearing to finger rub intact. Facial sensation intact to fine touch. Facial motor strength is symmetric and tongue and uvula move midline.  Motor exam:  Normal tone and normal muscle bulk and symmetric normal strength in all extremities.  She had lost her left patella reflex -   no longer pain, occ. numbness inner thigh, and no longer give away weakness.   Sensory:  Fine touch, pinprick and vibration were tested in all extremities. Proprioception is normal. Coordination: Rapid alternating movements in the fingers/hands is intact  Finger-to-nose maneuver without evidence of ataxia, dysmetria or tremor. Gait and station: Patient walks without assistive device . Strength within normal limits.  Stance is stable and normal.  Steps are unfragmented. Romberg testing is normal. Deep tendon reflexes: in the  upper and lower extremities are symmetric and intact. Babinski maneuver response is  downgoing.   Assessment:  15 minute evauation , further  progression of sciatica , further improvement of headaches.   ONO was performed as she could not afford a PSG.   The overnight pulse oximetry was performed on 09/29/2015, and interpreted on 10-05-15.  My response to the patient was that I would prefer her to have nocturnal oxygen. She only was 2 hours recorded that night the pulse clip may have been lost without her knowledge. However she spent 39 minutes under or at 88% saturation and 64.9 minutes under or at 89% oxygen saturation. She would qualify for nocturnal oxygen based on this. She is concerned about the costs.   More than 50% of the face to face time was dedicated to discussion and coordination of care.  After physical and neurologic examination, review of laboratory studies, imaging, neurophysiology testing and pre-existing records, assessment will be reviewed on the problem list.  #1 paroxysmal hemicrania  versus cluster headache responding still well to verapamil. We'll refill her medication at the current level for a year.   #2 the patient is a history of ulcerative colitis -she cannot take NSAIDs she told me. Has  Tylenol prn.   #3 history of sciatica with left foot drop and L4-5 dermatomal pain,  attenuated left patellar reflex.   Partially recovered the patient is basically not longer in pain ,but still knee pain -" feeling as if left knee is locking -  from the back of my knee", continues with ibuprofen. Knee arthritis and hip arthritis.   Seems to have normal strength and has recovered her left patellar reflex. Back pain and sciatica still flair up.   She denies again any SOB and snoring ,apnea.   Her BMI and neck size 16 inches are indicating high risk for OSA, especially in a smoker.    I encouraged the patient again to stop smoking , and she knows that this is one of her main risk factors for cluster and SUNCT headaches. I recommended to try at least a vapor.    ONO - as she could not afford a PSG. The overnight pulse oximetry was performed on 09/29/2015, and interpreted on 10-05-15. My response to the patient was that I would prefer her to have nocturnal oxygen. She only was 2 hours recorded that night the pulse clip may have been lost without her knowledge. However she spent 39 minutes under or at 88% saturation and 64.9 minutes under or at 89% oxygen saturation. She would qualify for nocturnal oxygen based on this. She is concerned about the costs.   Her insurance in her self employed situation is barely affordable. I will give her a list of assistive information for her task as well as some educational material about cluster headaches. She has been given smoking cessation information, long discussion. She is motivated -as is her husband. I hope that successful smoking cessation will correct her nocturnal hypoxemia.       Kimberlye Dilger, MD

## 2015-10-25 NOTE — Patient Instructions (Signed)
Sciatica With Rehab The sciatic nerve runs from the back down the leg and is responsible for sensation and control of the muscles in the back (posterior) side of the thigh, lower leg, and foot. Sciatica is a condition that is characterized by inflammation of this nerve.  SYMPTOMS   Signs of nerve damage, including numbness and/or weakness along the posterior side of the lower extremity.  Pain in the back of the thigh that may also travel down the leg.  Pain that worsens when sitting for long periods of time.  Occasionally, pain in the back or buttock. CAUSES  Inflammation of the sciatic nerve is the cause of sciatica. The inflammation is due to something irritating the nerve. Common sources of irritation include:  Sitting for long periods of time.  Direct trauma to the nerve.  Arthritis of the spine.  Herniated or ruptured disk.  Slipping of the vertebrae (spondylolisthesis).  Pressure from soft tissues, such as muscles or ligament-like tissue (fascia). RISK INCREASES WITH:  Sports that place pressure or stress on the spine (football or weightlifting).  Poor strength and flexibility.  Failure to warm up properly before activity.  Family history of low back pain or disk disorders.  Previous back injury or surgery.  Poor body mechanics, especially when lifting, or poor posture. PREVENTION   Warm up and stretch properly before activity.  Maintain physical fitness:  Strength, flexibility, and endurance.  Cardiovascular fitness.  Learn and use proper technique, especially with posture and lifting. When possible, have coach correct improper technique.  Avoid activities that place stress on the spine. PROGNOSIS If treated properly, then sciatica usually resolves within 6 weeks. However, occasionally surgery is necessary.  RELATED COMPLICATIONS   Permanent nerve damage, including pain, numbness, tingle, or weakness.  Chronic back pain.  Risks of surgery: infection,  bleeding, nerve damage, or damage to surrounding tissues. TREATMENT Treatment initially involves resting from any activities that aggravate your symptoms. The use of ice and medication may help reduce pain and inflammation. The use of strengthening and stretching exercises may help reduce pain with activity. These exercises may be performed at home or with referral to a therapist. A therapist may recommend further treatments, such as transcutaneous electronic nerve stimulation (TENS) or ultrasound. Your caregiver may recommend corticosteroid injections to help reduce inflammation of the sciatic nerve. If symptoms persist despite non-surgical (conservative) treatment, then surgery may be recommended. MEDICATION  If pain medication is necessary, then nonsteroidal anti-inflammatory medications, such as aspirin and ibuprofen, or other minor pain relievers, such as acetaminophen, are often recommended.  Do not take pain medication for 7 days before surgery.  Prescription pain relievers may be given if deemed necessary by your caregiver. Use only as directed and only as much as you need.  Ointments applied to the skin may be helpful.  Corticosteroid injections may be given by your caregiver. These injections should be reserved for the most serious cases, because they may only be given a certain number of times. HEAT AND COLD  Cold treatment (icing) relieves pain and reduces inflammation. Cold treatment should be applied for 10 to 15 minutes every 2 to 3 hours for inflammation and pain and immediately after any activity that aggravates your symptoms. Use ice packs or massage the area with a piece of ice (ice massage).  Heat treatment may be used prior to performing the stretching and strengthening activities prescribed by your caregiver, physical therapist, or athletic trainer. Use a heat pack or soak the injury in warm water.   SEEK MEDICAL CARE IF:  Treatment seems to offer no benefit, or the condition  worsens.  Any medications produce adverse side effects. EXERCISES  RANGE OF MOTION (ROM) AND STRETCHING EXERCISES - Sciatica Most people with sciatic will find that their symptoms worsen with either excessive bending forward (flexion) or arching at the low back (extension). The exercises which will help resolve your symptoms will focus on the opposite motion. Your physician, physical therapist or athletic trainer will help you determine which exercises will be most helpful to resolve your low back pain. Do not complete any exercises without first consulting with your clinician. Discontinue any exercises which worsen your symptoms until you speak to your clinician. If you have pain, numbness or tingling which travels down into your buttocks, leg or foot, the goal of the therapy is for these symptoms to move closer to your back and eventually resolve. Occasionally, these leg symptoms will get better, but your low back pain may worsen; this is typically an indication of progress in your rehabilitation. Be certain to be very alert to any changes in your symptoms and the activities in which you participated in the 24 hours prior to the change. Sharing this information with your clinician will allow him/her to most efficiently treat your condition. These exercises may help you when beginning to rehabilitate your injury. Your symptoms may resolve with or without further involvement from your physician, physical therapist or athletic trainer. While completing these exercises, remember:   Restoring tissue flexibility helps normal motion to return to the joints. This allows healthier, less painful movement and activity.  An effective stretch should be held for at least 30 seconds.  A stretch should never be painful. You should only feel a gentle lengthening or release in the stretched tissue. FLEXION RANGE OF MOTION AND STRETCHING EXERCISES: STRETCH - Flexion, Single Knee to Chest   Lie on a firm bed or floor  with both legs extended in front of you.  Keeping one leg in contact with the floor, bring your opposite knee to your chest. Hold your leg in place by either grabbing behind your thigh or at your knee.  Pull until you feel a gentle stretch in your low back. Hold __________ seconds.  Slowly release your grasp and repeat the exercise with the opposite side. Repeat __________ times. Complete this exercise __________ times per day.  STRETCH - Flexion, Double Knee to Chest  Lie on a firm bed or floor with both legs extended in front of you.  Keeping one leg in contact with the floor, bring your opposite knee to your chest.  Tense your stomach muscles to support your back and then lift your other knee to your chest. Hold your legs in place by either grabbing behind your thighs or at your knees.  Pull both knees toward your chest until you feel a gentle stretch in your low back. Hold __________ seconds.  Tense your stomach muscles and slowly return one leg at a time to the floor. Repeat __________ times. Complete this exercise __________ times per day.  STRETCH - Low Trunk Rotation   Lie on a firm bed or floor. Keeping your legs in front of you, bend your knees so they are both pointed toward the ceiling and your feet are flat on the floor.  Extend your arms out to the side. This will stabilize your upper body by keeping your shoulders in contact with the floor.  Gently and slowly drop both knees together to one side until   you feel a gentle stretch in your low back. Hold for __________ seconds.  Tense your stomach muscles to support your low back as you bring your knees back to the starting position. Repeat the exercise to the other side. Repeat __________ times. Complete this exercise __________ times per day  EXTENSION RANGE OF MOTION AND FLEXIBILITY EXERCISES: STRETCH - Extension, Prone on Elbows  Lie on your stomach on the floor, a bed will be too soft. Place your palms about shoulder  width apart and at the height of your head.  Place your elbows under your shoulders. If this is too painful, stack pillows under your chest.  Allow your body to relax so that your hips drop lower and make contact more completely with the floor.  Hold this position for __________ seconds.  Slowly return to lying flat on the floor. Repeat __________ times. Complete this exercise __________ times per day.  RANGE OF MOTION - Extension, Prone Press Ups  Lie on your stomach on the floor, a bed will be too soft. Place your palms about shoulder width apart and at the height of your head.  Keeping your back as relaxed as possible, slowly straighten your elbows while keeping your hips on the floor. You may adjust the placement of your hands to maximize your comfort. As you gain motion, your hands will come more underneath your shoulders.  Hold this position __________ seconds.  Slowly return to lying flat on the floor. Repeat __________ times. Complete this exercise __________ times per day.  STRENGTHENING EXERCISES - Sciatica  These exercises may help you when beginning to rehabilitate your injury. These exercises should be done near your "sweet spot." This is the neutral, low-back arch, somewhere between fully rounded and fully arched, that is your least painful position. When performed in this safe range of motion, these exercises can be used for people who have either a flexion or extension based injury. These exercises may resolve your symptoms with or without further involvement from your physician, physical therapist or athletic trainer. While completing these exercises, remember:   Muscles can gain both the endurance and the strength needed for everyday activities through controlled exercises.  Complete these exercises as instructed by your physician, physical therapist or athletic trainer. Progress with the resistance and repetition exercises only as your caregiver advises.  You may  experience muscle soreness or fatigue, but the pain or discomfort you are trying to eliminate should never worsen during these exercises. If this pain does worsen, stop and make certain you are following the directions exactly. If the pain is still present after adjustments, discontinue the exercise until you can discuss the trouble with your clinician. STRENGTHENING - Deep Abdominals, Pelvic Tilt   Lie on a firm bed or floor. Keeping your legs in front of you, bend your knees so they are both pointed toward the ceiling and your feet are flat on the floor.  Tense your lower abdominal muscles to press your low back into the floor. This motion will rotate your pelvis so that your tail bone is scooping upwards rather than pointing at your feet or into the floor.  With a gentle tension and even breathing, hold this position for __________ seconds. Repeat __________ times. Complete this exercise __________ times per day.  STRENGTHENING - Abdominals, Crunches   Lie on a firm bed or floor. Keeping your legs in front of you, bend your knees so they are both pointed toward the ceiling and your feet are flat on the   floor. Cross your arms over your chest.  Slightly tip your chin down without bending your neck.  Tense your abdominals and slowly lift your trunk high enough to just clear your shoulder blades. Lifting higher can put excessive stress on the low back and does not further strengthen your abdominal muscles.  Control your return to the starting position. Repeat __________ times. Complete this exercise __________ times per day.  STRENGTHENING - Quadruped, Opposite UE/LE Lift  Assume a hands and knees position on a firm surface. Keep your hands under your shoulders and your knees under your hips. You may place padding under your knees for comfort.  Find your neutral spine and gently tense your abdominal muscles so that you can maintain this position. Your shoulders and hips should form a rectangle  that is parallel with the floor and is not twisted.  Keeping your trunk steady, lift your right hand no higher than your shoulder and then your left leg no higher than your hip. Make sure you are not holding your breath. Hold this position __________ seconds.  Continuing to keep your abdominal muscles tense and your back steady, slowly return to your starting position. Repeat with the opposite arm and leg. Repeat __________ times. Complete this exercise __________ times per day.  STRENGTHENING - Abdominals and Quadriceps, Straight Leg Raise   Lie on a firm bed or floor with both legs extended in front of you.  Keeping one leg in contact with the floor, bend the other knee so that your foot can rest flat on the floor.  Find your neutral spine, and tense your abdominal muscles to maintain your spinal position throughout the exercise.  Slowly lift your straight leg off the floor about 6 inches for a count of 15, making sure to not hold your breath.  Still keeping your neutral spine, slowly lower your leg all the way to the floor. Repeat this exercise with each leg __________ times. Complete this exercise __________ times per day. POSTURE AND BODY MECHANICS CONSIDERATIONS - Sciatica Keeping correct posture when sitting, standing or completing your activities will reduce the stress put on different body tissues, allowing injured tissues a chance to heal and limiting painful experiences. The following are general guidelines for improved posture. Your physician or physical therapist will provide you with any instructions specific to your needs. While reading these guidelines, remember:  The exercises prescribed by your provider will help you have the flexibility and strength to maintain correct postures.  The correct posture provides the optimal environment for your joints to work. All of your joints have less wear and tear when properly supported by a spine with good posture. This means you will  experience a healthier, less painful body.  Correct posture must be practiced with all of your activities, especially prolonged sitting and standing. Correct posture is as important when doing repetitive low-stress activities (typing) as it is when doing a single heavy-load activity (lifting). RESTING POSITIONS Consider which positions are most painful for you when choosing a resting position. If you have pain with flexion-based activities (sitting, bending, stooping, squatting), choose a position that allows you to rest in a less flexed posture. You would want to avoid curling into a fetal position on your side. If your pain worsens with extension-based activities (prolonged standing, working overhead), avoid resting in an extended position such as sleeping on your stomach. Most people will find more comfort when they rest with their spine in a more neutral position, neither too rounded nor too   arched. Lying on a non-sagging bed on your side with a pillow between your knees, or on your back with a pillow under your knees will often provide some relief. Keep in mind, being in any one position for a prolonged period of time, no matter how correct your posture, can still lead to stiffness. PROPER SITTING POSTURE In order to minimize stress and discomfort on your spine, you must sit with correct posture Sitting with good posture should be effortless for a healthy body. Returning to good posture is a gradual process. Many people can work toward this most comfortably by using various supports until they have the flexibility and strength to maintain this posture on their own. When sitting with proper posture, your ears will fall over your shoulders and your shoulders will fall over your hips. You should use the back of the chair to support your upper back. Your low back will be in a neutral position, just slightly arched. You may place a small pillow or folded towel at the base of your low back for support.  When  working at a desk, create an environment that supports good, upright posture. Without extra support, muscles fatigue and lead to excessive strain on joints and other tissues. Keep these recommendations in mind: CHAIR:   A chair should be able to slide under your desk when your back makes contact with the back of the chair. This allows you to work closely.  The chair's height should allow your eyes to be level with the upper part of your monitor and your hands to be slightly lower than your elbows. BODY POSITION  Your feet should make contact with the floor. If this is not possible, use a foot rest.  Keep your ears over your shoulders. This will reduce stress on your neck and low back. INCORRECT SITTING POSTURES   If you are feeling tired and unable to assume a healthy sitting posture, do not slouch or slump. This puts excessive strain on your back tissues, causing more damage and pain. Healthier options include:  Using more support, like a lumbar pillow.  Switching tasks to something that requires you to be upright or walking.  Talking a brief walk.  Lying down to rest in a neutral-spine position. PROLONGED STANDING WHILE SLIGHTLY LEANING FORWARD  When completing a task that requires you to lean forward while standing in one place for a long time, place either foot up on a stationary 2-4 inch high object to help maintain the best posture. When both feet are on the ground, the low back tends to lose its slight inward curve. If this curve flattens (or becomes too large), then the back and your other joints will experience too much stress, fatigue more quickly and can cause pain.  CORRECT STANDING POSTURES Proper standing posture should be assumed with all daily activities, even if they only take a few moments, like when brushing your teeth. As in sitting, your ears should fall over your shoulders and your shoulders should fall over your hips. You should keep a slight tension in your abdominal  muscles to brace your spine. Your tailbone should point down to the ground, not behind your body, resulting in an over-extended swayback posture.  INCORRECT STANDING POSTURES  Common incorrect standing postures include a forward head, locked knees and/or an excessive swayback. WALKING Walk with an upright posture. Your ears, shoulders and hips should all line-up. PROLONGED ACTIVITY IN A FLEXED POSITION When completing a task that requires you to bend forward   at your waist or lean over a low surface, try to find a way to stabilize 3 of 4 of your limbs. You can place a hand or elbow on your thigh or rest a knee on the surface you are reaching across. This will provide you more stability so that your muscles do not fatigue as quickly. By keeping your knees relaxed, or slightly bent, you will also reduce stress across your low back. CORRECT LIFTING TECHNIQUES DO :   Assume a wide stance. This will provide you more stability and the opportunity to get as close as possible to the object which you are lifting.  Tense your abdominals to brace your spine; then bend at the knees and hips. Keeping your back locked in a neutral-spine position, lift using your leg muscles. Lift with your legs, keeping your back straight.  Test the weight of unknown objects before attempting to lift them.  Try to keep your elbows locked down at your sides in order get the best strength from your shoulders when carrying an object.  Always ask for help when lifting heavy or awkward objects. INCORRECT LIFTING TECHNIQUES DO NOT:   Lock your knees when lifting, even if it is a small object.  Bend and twist. Pivot at your feet or move your feet when needing to change directions.  Assume that you cannot safely pick up a paperclip without proper posture.   This information is not intended to replace advice given to you by your health care provider. Make sure you discuss any questions you have with your health care provider.     Document Released: 11/19/2005 Document Revised: 04/05/2015 Document Reviewed: 03/03/2009 [employer] Interactive Patient Education 2016 [employer]  

## 2015-11-03 ENCOUNTER — Other Ambulatory Visit: Payer: Self-pay | Admitting: Family Medicine

## 2015-11-03 MED ORDER — CITALOPRAM HYDROBROMIDE 20 MG PO TABS: 20.0000 mg | ORAL_TABLET | Freq: Every day | ORAL | Status: DC

## 2015-12-12 ENCOUNTER — Ambulatory Visit: Payer: BLUE CROSS/BLUE SHIELD | Admitting: Physician Assistant

## 2015-12-12 ENCOUNTER — Encounter: Payer: Self-pay | Admitting: Family Medicine

## 2015-12-12 ENCOUNTER — Ambulatory Visit (INDEPENDENT_AMBULATORY_CARE_PROVIDER_SITE_OTHER): Payer: BLUE CROSS/BLUE SHIELD | Admitting: Family Medicine

## 2015-12-12 VITALS — BP 126/74 | HR 88 | Temp 98.2°F | Resp 18 | Wt 250.0 lb

## 2015-12-12 DIAGNOSIS — J019 Acute sinusitis, unspecified: Secondary | ICD-10-CM

## 2015-12-12 MED ORDER — AMOXICILLIN-POT CLAVULANATE 875-125 MG PO TABS
1.0000 | ORAL_TABLET | Freq: Two times a day (BID) | ORAL | Status: DC
Start: 2015-12-12 — End: 2015-12-22

## 2015-12-12 NOTE — Progress Notes (Signed)
Subjective:    Patient ID: Wendy Crawford, female    DOB: 1956/04/08, 60 y.o.   MRN: [id number]  HPI Symptoms began 4 days ago with rhinorrhea. She essentially developed pain and pressure in both maxillary sinuses, pain behind both the years and pressure behind both ears, postnasal drip and nonproductive cough. She denies any purulent nasal discharge. She denies any shortness of breath. She denies any fevers. She denies any chest pain Past Medical History  Diagnosis Date  . Psoriasis   . Headache(784.0)     most severe for 2 months,cluster  . Colitis     ulcerative  . 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 Headaches, cluster     02 and medication  . Primary snoring 06/17/2014   Past Surgical History  Procedure Laterality Date  . Breast biopsy  1990   Current Outpatient Prescriptions on File Prior to Visit  Medication Sig Dispense Refill  . acetaminophen (TYLENOL) 500 MG tablet Take 500 mg by mouth every 6 (six) hours as needed for pain.    . citalopram (CELEXA) 20 MG tablet Take 1 tablet (20 mg total) by mouth daily. 90 tablet 3  . cyclobenzaprine (FLEXERIL) 10 MG tablet   2  . gabapentin (NEURONTIN) 300 MG capsule   5  . predniSONE (DELTASONE) 50 MG tablet   0  . verapamil (CALAN-SR) 180 MG CR tablet Take 1 tablet (180 mg total) by mouth at bedtime. 90 tablet 3   No current facility-administered medications on file prior to visit.   Allergies  Allergen Reactions  . Sulfa Antibiotics   . Sulfonamide Derivatives    Social History   Social History  . Marital Status: Married    Spouse Name: [name]  . Number of Children: 0  . Years of Education: 12   Occupational History  . self employed    Social History Main Topics  . Smoking status: Current Every Day Smoker -- 0.50 packs/day for 42 years  . Smokeless tobacco: Never Used  . Alcohol Use: No  . Drug Use: No  . Sexual Activity: Not on file   Other Topics Concern  . Not on file    Social History Narrative   Patient is married [name] and lives at home with her husband.   Patient is working full-time.   Patient has a high school education.   Patient is right-handed.   Patient does not drinks any caffeine.     Review of Systems  All other systems reviewed and are negative.      Objective:   Physical Exam  HENT:  Right Ear: External ear and ear canal normal. A middle ear effusion is present.  Left Ear: External ear and ear canal normal. A middle ear effusion is present.  Nose: Mucosal edema and rhinorrhea present. Right sinus exhibits maxillary sinus tenderness. Left sinus exhibits maxillary sinus tenderness.  Eyes: Conjunctivae are normal.  Neck: Neck supple.  Cardiovascular: Normal rate, regular rhythm and normal heart sounds.   Pulmonary/Chest: Effort normal and breath sounds normal. No respiratory distress. She has no wheezes. She has no rales.  Lymphadenopathy:    She has no cervical adenopathy.  Vitals reviewed.         Assessment & Plan:  Acute rhinosinusitis - Plan: amoxicillin-clavulanate (AUGMENTIN) 875-125 MG tablet symptoms are consistent with a viral upper respiratory infection/sinusitis. I have recommended tincture of time. She can use Sudafed 60 mg every 4-6 hours as needed for congestion. I recommended  nasal saline 4 times a day to help flush the sinuses. I recommended tincture of time. Should symptoms persist more than 10 days, or she develops a high fever, or she develops severe pain in her sinuses, I did give her Augmentin 875 mg by mouth twice a day to be used only if necessary. We discussed emerging antibiotic resistance and reasons not to take antibiotics for sinus infections less than 10 days.

## 2015-12-22 ENCOUNTER — Ambulatory Visit (INDEPENDENT_AMBULATORY_CARE_PROVIDER_SITE_OTHER): Payer: BLUE CROSS/BLUE SHIELD | Admitting: Family Medicine

## 2015-12-22 ENCOUNTER — Encounter: Payer: Self-pay | Admitting: Family Medicine

## 2015-12-22 VITALS — BP 98/68 | HR 92 | Temp 98.0°F | Resp 20 | Wt 244.0 lb

## 2015-12-22 DIAGNOSIS — J328 Other chronic sinusitis: Secondary | ICD-10-CM | POA: Diagnosis not present

## 2015-12-22 DIAGNOSIS — H6982 Other specified disorders of Eustachian tube, left ear: Secondary | ICD-10-CM

## 2015-12-22 MED ORDER — CEFDINIR 300 MG PO CAPS: 300.0000 mg | ORAL_CAPSULE | Freq: Two times a day (BID) | ORAL | Status: DC

## 2015-12-22 MED ORDER — PREDNISONE 20 MG PO TABS: ORAL_TABLET | ORAL | Status: DC

## 2015-12-22 NOTE — Progress Notes (Signed)
Subjective:    Patient ID: Wendy Crawford, female    DOB: 09/27/1956, 60 y.o.   MRN: [id number]  HPI 12/12/15 Symptoms began 4 days ago with rhinorrhea. She essentially developed pain and pressure in both maxillary sinuses, pain behind both the years and pressure behind both ears, postnasal drip and nonproductive cough. She denies any purulent nasal discharge. She denies any shortness of breath. She denies any fevers. She denies any chest pain.  At that time, my plan LTR:VUYEBXID are consistent with a viral upper respiratory infection/sinusitis. I have recommended tincture of time. She can use Sudafed 60 mg every 4-6 hours as needed for congestion. I recommended nasal saline 4 times a day to help flush the sinuses. I recommended tincture of time. Should symptoms persist more than 10 days, or she develops a high fever, or she develops severe pain in her sinuses, I did give her Augmentin 875 mg by mouth twice a day to be used only if necessary. We discussed emerging antibiotic resistance and reasons not to take antibiotics for sinus infections less than 10 days. 12/22/15 Patient family wound up taking the Augmentin without relief. Now her biggest problem is constant pressure in her sinuses and her left ear feels stopped up she states that she feels like she is under water and there is water in her ear. She is having a difficult time hearing out of her left ear. She describes it like going up an airplane or going up in the mountains. On examination today the left tympanic membrane is clear but bulging with a middle ear effusion that appears to be serous in nature Past Medical History  Diagnosis Date  . Psoriasis   . Headache(784.0)     most severe for 2 months,cluster  . Colitis     ulcerative  . 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 Headaches, cluster     02 and medication  . Primary snoring 06/17/2014   Past Surgical History  Procedure Laterality Date  . Breast  biopsy  1990   Current Outpatient Prescriptions on File Prior to Visit  Medication Sig Dispense Refill  . acetaminophen (TYLENOL) 500 MG tablet Take 500 mg by mouth every 6 (six) hours as needed for pain.    . citalopram (CELEXA) 20 MG tablet Take 1 tablet (20 mg total) by mouth daily. 90 tablet 3  . cyclobenzaprine (FLEXERIL) 10 MG tablet   2  . gabapentin (NEURONTIN) 300 MG capsule   5  . verapamil (CALAN-SR) 180 MG CR tablet Take 1 tablet (180 mg total) by mouth at bedtime. 90 tablet 3   No current facility-administered medications on file prior to visit.   Allergies  Allergen Reactions  . Sulfa Antibiotics   . Sulfonamide Derivatives    Social History   Social History  . Marital Status: Married    Spouse Name: [name]  . Number of Children: 0  . Years of Education: 12   Occupational History  . self employed    Social History Main Topics  . Smoking status: Current Every Day Smoker -- 0.50 packs/day for 42 years  . Smokeless tobacco: Never Used  . Alcohol Use: No  . Drug Use: No  . Sexual Activity: Not on file   Other Topics Concern  . Not on file   Social History Narrative   Patient is married [name] and lives at home with her husband.   Patient is working full-time.   Patient has a high school  education.   Patient is right-handed.   Patient does not drinks any caffeine.     Review of Systems  All other systems reviewed and are negative.      Objective:   Physical Exam  HENT:  Right Ear: External ear and ear canal normal.  Left Ear: External ear and ear canal normal. A middle ear effusion is present.  Nose: Mucosal edema and rhinorrhea present. Right sinus exhibits maxillary sinus tenderness. Left sinus exhibits maxillary sinus tenderness.  Eyes: Conjunctivae are normal.  Neck: Neck supple.  Cardiovascular: Normal rate, regular rhythm and normal heart sounds.   Pulmonary/Chest: Effort normal and breath sounds normal. No respiratory distress. She has no  wheezes. She has no rales.  Lymphadenopathy:    She has no cervical adenopathy.  Vitals reviewed.         Assessment & Plan:  Other chronic sinusitis - Plan: predniSONE (DELTASONE) 20 MG tablet  Eustachian tube dysfunction, left - Plan: predniSONE (DELTASONE) 20 MG tablet  I believe the patient has sinusitis with eustachian tube dysfunction. Begin a prednisone taper pack coupled with Omnicef 300 mg by mouth twice a day for 10 days. Consult ENT if no better

## 2016-04-05 ENCOUNTER — Other Ambulatory Visit: Payer: Self-pay

## 2016-04-05 DIAGNOSIS — Z1231 Encounter for screening mammogram for malignant neoplasm of breast: Secondary | ICD-10-CM

## 2016-04-17 ENCOUNTER — Ambulatory Visit
Admission: RE | Admit: 2016-04-17 | Discharge: 2016-04-17 | Disposition: A | Discharge status: Home / Self Care | Payer: BLUE CROSS/BLUE SHIELD | Source: Ambulatory Visit

## 2016-04-17 DIAGNOSIS — Z1231 Encounter for screening mammogram for malignant neoplasm of breast: Secondary | ICD-10-CM

## 2016-05-23 ENCOUNTER — Other Ambulatory Visit: Payer: Self-pay | Admitting: Physician Assistant

## 2016-05-31 NOTE — Patient Instructions (Addendum)
Wendy Crawford  05/31/2016   Your procedure is scheduled on: 06/08/2016    Report to [facility] Main  Entrance take [facility]  elevators to 3rd floor to  [place] at    1045 AM.  Call this number if you have problems the morning of surgery [phone]   Remember: ONLY 1 PERSON MAY GO WITH YOU TO SHORT STAY TO GET  READY MORNING OF [place].  Do not eat food or drink liquids :After Midnight.     Take these medicines the morning of surgery with A SIP OF WATER: Citalopram ( Celexa)                                You may not have any metal on your body including hair pins and              piercings  Do not wear jewelry, make-up, lotions, powders or perfumes, deodorant             Do not wear nail polish.  Do not shave  48 hours prior to surgery.             Do not bring valuables to the hospital. [place].  Contacts, dentures or bridgework may not be worn into surgery.  Leave suitcase in the car. After surgery it may be brought to your room.               Please read over the following fact sheets you were given: _____________________________________________________________________             [facility] - Preparing for Surgery Before surgery, you can play an important role.  Because skin is not sterile, your skin needs to be as free of germs as possible.  You can reduce the number of germs on your skin by washing with CHG (chlorahexidine gluconate) soap before surgery.  CHG is an antiseptic cleaner which kills germs and bonds with the skin to continue killing germs even after washing. Please DO NOT use if you have an allergy to CHG or antibacterial soaps.  If your skin becomes reddened/irritated stop using the CHG and inform your nurse when you arrive at Short Stay. Do not shave (including legs and underarms) for at least 48 hours prior to the first CHG shower.  You may shave your face/neck. Please  follow these instructions carefully:  1.  Shower with CHG Soap the night before surgery and the  morning of Surgery.  2.  If you choose to wash your hair, wash your hair first as usual with your  normal  shampoo.  3.  After you shampoo, rinse your hair and body thoroughly to remove the  shampoo.                           4.  Use CHG as you would any other liquid soap.  You can apply chg directly  to the skin and wash                       Gently with a scrungie or clean washcloth.  5.  Apply the CHG Soap to your body ONLY FROM  THE NECK DOWN.   Do not use on face/ open                           Wound or open sores. Avoid contact with eyes, ears mouth and genitals (private parts).                       Wash face,  Genitals (private parts) with your normal soap.             6.  Wash thoroughly, paying special attention to the area where your surgery  will be performed.  7.  Thoroughly rinse your body with warm water from the neck down.  8.  DO NOT shower/wash with your normal soap after using and rinsing off  the CHG Soap.                9.  Pat yourself dry with a clean towel.            10.  Wear clean pajamas.            11.  Place clean sheets on your bed the night of your first shower and do not  sleep with pets. Day of Surgery : Do not apply any lotions/deodorants the morning of surgery.  Please wear clean clothes to the hospital/surgery center.  FAILURE TO FOLLOW THESE INSTRUCTIONS MAY RESULT IN THE CANCELLATION OF YOUR SURGERY PATIENT SIGNATURE_________________________________  NURSE SIGNATURE__________________________________  ________________________________________________________________________  WHAT IS A BLOOD TRANSFUSION? Blood Transfusion Information  A transfusion is the replacement of blood or some of its parts. Blood is made up of multiple cells which provide different functions.  Red blood cells carry oxygen and are used for blood loss replacement.  White blood cells  fight against infection.  Platelets control bleeding.  Plasma helps clot blood.  Other blood products are available for specialized needs, such as hemophilia or other clotting disorders. BEFORE THE TRANSFUSION  Who gives blood for transfusions?   Healthy volunteers who are fully evaluated to make sure their blood is safe. This is blood bank blood. Transfusion therapy is the safest it has ever been in the practice of medicine. Before blood is taken from a donor, a complete history is taken to make sure that person has no history of diseases nor engages in risky social behavior (examples are intravenous drug use or sexual activity with multiple partners). The donor's travel history is screened to minimize risk of transmitting infections, such as malaria. The donated blood is tested for signs of infectious diseases, such as HIV and hepatitis. The blood is then tested to be sure it is compatible with you in order to minimize the chance of a transfusion reaction. If you or a relative donates blood, this is often done in anticipation of surgery and is not appropriate for emergency situations. It takes many days to process the donated blood. RISKS AND COMPLICATIONS Although transfusion therapy is very safe and saves many lives, the main dangers of transfusion include:  1. Getting an infectious disease. 2. Developing a transfusion reaction. This is an allergic reaction to something in the blood you were given. Every precaution is taken to prevent this. The decision to have a blood transfusion has been considered carefully by your caregiver before blood is given. Blood is not given unless the benefits outweigh the risks. AFTER THE TRANSFUSION  Right after receiving a blood transfusion, you will usually feel much better and  more energetic. This is especially true if your red blood cells have gotten low (anemic). The transfusion raises the level of the red blood cells which carry oxygen, and this usually  causes an energy increase.  The nurse administering the transfusion will monitor you carefully for complications. HOME CARE INSTRUCTIONS  No special instructions are needed after a transfusion. You may find your energy is better. Speak with your caregiver about any limitations on activity for underlying diseases you may have. SEEK MEDICAL CARE IF:   Your condition is not improving after your transfusion.  You develop redness or irritation at the intravenous (IV) site. SEEK IMMEDIATE MEDICAL CARE IF:  Any of the following symptoms occur over the next 12 hours:  Shaking chills.  You have a temperature by mouth above 102 F (38.9 C), not controlled by medicine.  Chest, back, or muscle pain.  People around you feel you are not acting correctly or are confused.  Shortness of breath or difficulty breathing.  Dizziness and fainting.  You get a rash or develop hives.  You have a decrease in urine output.  Your urine turns a dark color or changes to pink, red, or brown. Any of the following symptoms occur over the next 10 days:  You have a temperature by mouth above 102 F (38.9 C), not controlled by medicine.  Shortness of breath.  Weakness after normal activity.  The white part of the eye turns yellow (jaundice).  You have a decrease in the amount of urine or are urinating less often.  Your urine turns a dark color or changes to pink, red, or brown. Document Released: 11/16/2000 Document Revised: 02/11/2012 Document Reviewed: 07/05/2008 ExitCare Patient Information 2014 [place].  _______________________________________________________________________  Incentive Spirometer  An incentive spirometer is a tool that can help keep your lungs clear and active. This tool measures how well you are filling your lungs with each breath. Taking long deep breaths may help reverse or decrease the chance of developing breathing (pulmonary) problems (especially infection)  following:  A long period of time when you are unable to move or be active. BEFORE THE PROCEDURE   If the spirometer includes an indicator to show your best effort, your nurse or respiratory therapist will set it to a desired goal.  If possible, sit up straight or lean slightly forward. Try not to slouch.  Hold the incentive spirometer in an upright position. INSTRUCTIONS FOR USE  3. Sit on the edge of your bed if possible, or sit up as far as you can in bed or on a chair. 4. Hold the incentive spirometer in an upright position. 5. Breathe out normally. 6. Place the mouthpiece in your mouth and seal your lips tightly around it. 7. Breathe in slowly and as deeply as possible, raising the piston or the ball toward the top of the column. 8. Hold your breath for 3-5 seconds or for as long as possible. Allow the piston or ball to fall to the bottom of the column. 9. Remove the mouthpiece from your mouth and breathe out normally. 10. Rest for a few seconds and repeat Steps 1 through 7 at least 10 times every 1-2 hours when you are awake. Take your time and take a few normal breaths between deep breaths. 11. The spirometer may include an indicator to show your best effort. Use the indicator as a goal to work toward during each repetition. 12. After each set of 10 deep breaths, practice coughing to be sure your lungs  are clear. If you have an incision (the cut made at the time of surgery), support your incision when coughing by placing a pillow or rolled up towels firmly against it. Once you are able to get out of bed, walk around indoors and cough well. You may stop using the incentive spirometer when instructed by your caregiver.  RISKS AND COMPLICATIONS  Take your time so you do not get dizzy or light-headed.  If you are in pain, you may need to take or ask for pain medication before doing incentive spirometry. It is harder to take a deep breath if you are having pain. AFTER USE  Rest and  breathe slowly and easily.  It can be helpful to keep track of a log of your progress. Your caregiver can provide you with a simple table to help with this. If you are using the spirometer at home, follow these instructions: [place] IF:   You are having difficultly using the spirometer.  You have trouble using the spirometer as often as instructed.  Your pain medication is not giving enough relief while using the spirometer.  You develop fever of 100.5 F (38.1 C) or higher. SEEK IMMEDIATE MEDICAL CARE IF:   You cough up bloody sputum that had not been present before.  You develop fever of 102 F (38.9 C) or greater.  You develop worsening pain at or near the incision site. MAKE SURE YOU:   Understand these instructions.  Will watch your condition.  Will get help right away if you are not doing well or get worse. Document Released: 04/01/2007 Document Revised: 02/11/2012 Document Reviewed: 06/02/2007 [facility] Patient Information 2014 [place], [place].   ________________________________________________________________________

## 2016-06-01 ENCOUNTER — Encounter (HOSPITAL_COMMUNITY): Payer: Self-pay

## 2016-06-01 ENCOUNTER — Encounter (HOSPITAL_COMMUNITY)
Admission: RE | Admit: 2016-06-01 | Discharge: 2016-06-01 | Disposition: A | Discharge status: Home / Self Care | Payer: BLUE CROSS/BLUE SHIELD | Source: Ambulatory Visit | Attending: Orthopaedic Surgery | Admitting: Orthopaedic Surgery

## 2016-06-01 DIAGNOSIS — Z01818 Encounter for other preprocedural examination: Secondary | ICD-10-CM | POA: Diagnosis present

## 2016-06-01 DIAGNOSIS — Z01812 Encounter for preprocedural laboratory examination: Secondary | ICD-10-CM | POA: Insufficient documentation

## 2016-06-01 DIAGNOSIS — M1612 Unilateral primary osteoarthritis, left hip: Secondary | ICD-10-CM | POA: Insufficient documentation

## 2016-06-01 DIAGNOSIS — I10 Essential (primary) hypertension: Secondary | ICD-10-CM | POA: Insufficient documentation

## 2016-06-01 DIAGNOSIS — Z0183 Encounter for blood typing: Secondary | ICD-10-CM | POA: Insufficient documentation

## 2016-06-01 HISTORY — DX: Pneumonia, unspecified organism: J18.9

## 2016-06-01 HISTORY — DX: Depression, unspecified: F32.A

## 2016-06-01 HISTORY — DX: Unspecified osteoarthritis, unspecified site: M19.90

## 2016-06-01 HISTORY — DX: Major depressive disorder, single episode, unspecified: F32.9

## 2016-06-01 LAB — BASIC METABOLIC PANEL
Anion gap: 7 (ref 5–15)
BUN: 15 mg/dL (ref 6–20)
CO2: 28 mmol/L (ref 22–32)
Calcium: 9.6 mg/dL (ref 8.9–10.3)
Chloride: 106 mmol/L (ref 101–111)
Creatinine, Ser: 0.68 mg/dL (ref 0.44–1.00)
GFR calc Af Amer: >60 mL/min (ref >60)
GFR calc non Af Amer: >60 mL/min (ref >60)
Glucose, Bld: 97 mg/dL (ref 65–99)
Potassium: 5 mmol/L (ref 3.5–5.1)
Sodium: 141 mmol/L (ref 135–145)

## 2016-06-01 LAB — CBC
HCT: 40.8 % (ref 36.0–46.0)
Hemoglobin: 13.4 g/dL (ref 12.0–15.0)
MCH: 29.6 pg (ref 26.0–34.0)
MCHC: 32.8 g/dL (ref 30.0–36.0)
MCV: 90.1 fL (ref 78.0–100.0)
Platelets: 227 10*3/uL (ref 150–400)
RBC: 4.53 MIL/uL (ref 3.87–5.11)
RDW: 13.6 % (ref 11.5–15.5)
WBC: 4.7 10*3/uL (ref 4.0–10.5)

## 2016-06-01 LAB — SURGICAL PCR SCREEN
MRSA, PCR: NEGATIVE
Staphylococcus aureus: NEGATIVE

## 2016-06-01 LAB — ABO/RH: ABO/RH(D): O POS

## 2016-06-07 NOTE — Progress Notes (Signed)
Spoke with patient by phone aware time changed to 1130 surgery, arrive 930 am 06-08-16 w[facility] short stay.npo after midnight

## 2016-06-08 ENCOUNTER — Inpatient Hospital Stay (HOSPITAL_COMMUNITY): Payer: BLUE CROSS/BLUE SHIELD | Admitting: Certified Registered Nurse Anesthetist

## 2016-06-08 ENCOUNTER — Inpatient Hospital Stay (HOSPITAL_COMMUNITY): Payer: BLUE CROSS/BLUE SHIELD

## 2016-06-08 ENCOUNTER — Encounter (HOSPITAL_COMMUNITY): Payer: Self-pay

## 2016-06-08 ENCOUNTER — Encounter (HOSPITAL_COMMUNITY)
Admission: RE | Disposition: A | Discharge status: Home Health | Payer: Self-pay | Source: Ambulatory Visit | Attending: Orthopaedic Surgery

## 2016-06-08 ENCOUNTER — Inpatient Hospital Stay (HOSPITAL_COMMUNITY)
Admission: RE | Admit: 2016-06-08 | Discharge: 2016-06-10 | DRG: 470 | Disposition: A | Discharge status: Home Health | Payer: BLUE CROSS/BLUE SHIELD | Source: Ambulatory Visit | Attending: Orthopaedic Surgery | Admitting: Orthopaedic Surgery

## 2016-06-08 DIAGNOSIS — F329 Major depressive disorder, single episode, unspecified: Secondary | ICD-10-CM | POA: Diagnosis present

## 2016-06-08 DIAGNOSIS — F17210 Nicotine dependence, cigarettes, uncomplicated: Secondary | ICD-10-CM | POA: Diagnosis present

## 2016-06-08 DIAGNOSIS — M25552 Pain in left hip: Secondary | ICD-10-CM | POA: Diagnosis present

## 2016-06-08 DIAGNOSIS — Z6841 Body Mass Index (BMI) 40.0 and over, adult: Secondary | ICD-10-CM

## 2016-06-08 DIAGNOSIS — Z419 Encounter for procedure for purposes other than remedying health state, unspecified: Secondary | ICD-10-CM

## 2016-06-08 DIAGNOSIS — M1612 Unilateral primary osteoarthritis, left hip: Secondary | ICD-10-CM | POA: Diagnosis present

## 2016-06-08 DIAGNOSIS — J449 Chronic obstructive pulmonary disease, unspecified: Secondary | ICD-10-CM | POA: Diagnosis present

## 2016-06-08 DIAGNOSIS — Z96642 Presence of left artificial hip joint: Secondary | ICD-10-CM

## 2016-06-08 HISTORY — PX: TOTAL HIP ARTHROPLASTY: SHX124

## 2016-06-08 LAB — TYPE AND SCREEN
ABO/RH(D): O POS
Antibody Screen: NEGATIVE

## 2016-06-08 SURGERY — ARTHROPLASTY, HIP, TOTAL, ANTERIOR APPROACH
Anesthesia: Spinal | Site: Hip | Laterality: Left

## 2016-06-08 MED ORDER — METHOCARBAMOL 1000 MG/10ML IJ SOLN
500.0000 mg | Freq: Once | INTRAVENOUS | Status: DC
  Filled 2016-06-08: qty 5

## 2016-06-08 MED ORDER — PROPOFOL 10 MG/ML IV BOLUS
INTRAVENOUS | Status: AC
  Filled 2016-06-08: qty 20

## 2016-06-08 MED ORDER — LIDOCAINE HCL (CARDIAC) 20 MG/ML IV SOLN
INTRAVENOUS | Status: DC | PRN
  Administered 2016-06-08: 50 mg via INTRAVENOUS

## 2016-06-08 MED ORDER — ONDANSETRON HCL 4 MG/2ML IJ SOLN
4.0000 mg | Freq: Four times a day (QID) | INTRAMUSCULAR | Status: DC | PRN
  Administered 2016-06-09: 4 mg via INTRAVENOUS
  Filled 2016-06-08: qty 2

## 2016-06-08 MED ORDER — MEPERIDINE HCL 50 MG/ML IJ SOLN: 6.2500 mg | INTRAMUSCULAR | Status: DC | PRN

## 2016-06-08 MED ORDER — LACTATED RINGERS IV SOLN: INTRAVENOUS | Status: DC

## 2016-06-08 MED ORDER — PROPOFOL 500 MG/50ML IV EMUL
INTRAVENOUS | Status: DC | PRN
  Administered 2016-06-08: 50 ug/kg/min via INTRAVENOUS

## 2016-06-08 MED ORDER — LACTATED RINGERS IV SOLN
INTRAVENOUS | Status: DC
  Administered 2016-06-08: 1000 mL via INTRAVENOUS
  Administered 2016-06-08: 12:00:00 via INTRAVENOUS

## 2016-06-08 MED ORDER — PHENYLEPHRINE HCL 10 MG/ML IJ SOLN
10.0000 mg | INTRAVENOUS | Status: DC | PRN
  Administered 2016-06-08: 50 ug/min via INTRAVENOUS

## 2016-06-08 MED ORDER — ONDANSETRON HCL 4 MG PO TABS: 4.0000 mg | ORAL_TABLET | Freq: Four times a day (QID) | ORAL | Status: DC | PRN

## 2016-06-08 MED ORDER — FENTANYL CITRATE (PF) 100 MCG/2ML IJ SOLN
INTRAMUSCULAR | Status: DC | PRN
  Administered 2016-06-08: 50 ug via INTRAVENOUS

## 2016-06-08 MED ORDER — SODIUM CHLORIDE 0.9 % IR SOLN
Status: DC | PRN
  Administered 2016-06-08: 1000 mL

## 2016-06-08 MED ORDER — OXYCODONE HCL 5 MG PO TABS
5.0000 mg | ORAL_TABLET | ORAL | Status: DC | PRN
  Administered 2016-06-08 – 2016-06-09 (×3): 10 mg via ORAL
  Filled 2016-06-08 (×3): qty 2

## 2016-06-08 MED ORDER — ASPIRIN EC 325 MG PO TBEC
325.0000 mg | DELAYED_RELEASE_TABLET | Freq: Two times a day (BID) | ORAL | Status: DC
  Administered 2016-06-08 – 2016-06-10 (×4): 325 mg via ORAL
  Filled 2016-06-08 (×4): qty 1

## 2016-06-08 MED ORDER — LIDOCAINE HCL (CARDIAC) 20 MG/ML IV SOLN
INTRAVENOUS | Status: AC
  Filled 2016-06-08: qty 5

## 2016-06-08 MED ORDER — DOCUSATE SODIUM 100 MG PO CAPS
100.0000 mg | ORAL_CAPSULE | Freq: Two times a day (BID) | ORAL | Status: DC
  Administered 2016-06-08 – 2016-06-10 (×4): 100 mg via ORAL
  Filled 2016-06-08 (×4): qty 1

## 2016-06-08 MED ORDER — BUPIVACAINE IN DEXTROSE 0.75-8.25 % IT SOLN
INTRATHECAL | Status: DC | PRN
  Administered 2016-06-08: 1.6 mL via INTRATHECAL

## 2016-06-08 MED ORDER — FAMOTIDINE 20 MG PO TABS
20.0000 mg | ORAL_TABLET | Freq: Two times a day (BID) | ORAL | Status: DC
  Administered 2016-06-09 – 2016-06-10 (×2): 20 mg via ORAL
  Filled 2016-06-08 (×2): qty 1

## 2016-06-08 MED ORDER — ACETAMINOPHEN 10 MG/ML IV SOLN
INTRAVENOUS | Status: AC
  Filled 2016-06-08: qty 100

## 2016-06-08 MED ORDER — CEFAZOLIN SODIUM-DEXTROSE 2-4 GM/100ML-% IV SOLN
INTRAVENOUS | Status: AC
  Filled 2016-06-08: qty 100

## 2016-06-08 MED ORDER — ALUM & MAG HYDROXIDE-SIMETH 200-200-20 MG/5ML PO SUSP: 30.0000 mL | ORAL | Status: DC | PRN

## 2016-06-08 MED ORDER — DIPHENHYDRAMINE HCL 12.5 MG/5ML PO ELIX: 12.5000 mg | ORAL_SOLUTION | ORAL | Status: DC | PRN

## 2016-06-08 MED ORDER — METOCLOPRAMIDE HCL 10 MG PO TABS: 5.0000 mg | ORAL_TABLET | Freq: Three times a day (TID) | ORAL | Status: DC | PRN

## 2016-06-08 MED ORDER — ONDANSETRON HCL 4 MG/2ML IJ SOLN
INTRAMUSCULAR | Status: DC | PRN
  Administered 2016-06-08: 4 mg via INTRAVENOUS

## 2016-06-08 MED ORDER — TRANEXAMIC ACID 1000 MG/10ML IV SOLN
1000.0000 mg | INTRAVENOUS | Status: AC
  Administered 2016-06-08: 1000 mg via INTRAVENOUS
  Filled 2016-06-08: qty 10

## 2016-06-08 MED ORDER — FENTANYL CITRATE (PF) 100 MCG/2ML IJ SOLN
INTRAMUSCULAR | Status: AC
  Filled 2016-06-08: qty 2

## 2016-06-08 MED ORDER — METOCLOPRAMIDE HCL 5 MG/ML IJ SOLN: 5.0000 mg | Freq: Three times a day (TID) | INTRAMUSCULAR | Status: DC | PRN

## 2016-06-08 MED ORDER — KETOROLAC TROMETHAMINE 15 MG/ML IJ SOLN
7.5000 mg | Freq: Four times a day (QID) | INTRAMUSCULAR | Status: AC
  Administered 2016-06-08 – 2016-06-09 (×3): 7.5 mg via INTRAVENOUS
  Filled 2016-06-08 (×3): qty 1

## 2016-06-08 MED ORDER — ONDANSETRON HCL 4 MG/2ML IJ SOLN
INTRAMUSCULAR | Status: AC
Start: 2016-06-08 — End: 2016-06-08
  Filled 2016-06-08: qty 2

## 2016-06-08 MED ORDER — HYDROMORPHONE HCL 1 MG/ML IJ SOLN
0.2500 mg | INTRAMUSCULAR | Status: DC | PRN
Start: 2016-06-08 — End: 2016-06-08
  Administered 2016-06-08: 0.5 mg via INTRAVENOUS

## 2016-06-08 MED ORDER — CEFAZOLIN IN D5W 1 GM/50ML IV SOLN
1.0000 g | Freq: Four times a day (QID) | INTRAVENOUS | Status: AC
  Administered 2016-06-08 (×2): 1 g via INTRAVENOUS
  Filled 2016-06-08 (×3): qty 50

## 2016-06-08 MED ORDER — HYDROMORPHONE HCL 1 MG/ML IJ SOLN
0.2500 mg | INTRAMUSCULAR | Status: DC | PRN
  Administered 2016-06-08: 0.5 mg via INTRAVENOUS

## 2016-06-08 MED ORDER — ACETAMINOPHEN 10 MG/ML IV SOLN
1000.0000 mg | Freq: Once | INTRAVENOUS | Status: AC
  Administered 2016-06-08: 1000 mg via INTRAVENOUS

## 2016-06-08 MED ORDER — PHENYLEPHRINE HCL 10 MG/ML IJ SOLN
INTRAMUSCULAR | Status: DC | PRN
  Administered 2016-06-08 (×6): 80 ug via INTRAVENOUS

## 2016-06-08 MED ORDER — CHLORHEXIDINE GLUCONATE 4 % EX LIQD: 60.0000 mL | Freq: Once | CUTANEOUS | Status: DC

## 2016-06-08 MED ORDER — PROPOFOL 10 MG/ML IV BOLUS
INTRAVENOUS | Status: AC
  Filled 2016-06-08: qty 40

## 2016-06-08 MED ORDER — SODIUM CHLORIDE 0.9 % IV SOLN
INTRAVENOUS | Status: DC
  Administered 2016-06-08: 15:00:00 via INTRAVENOUS

## 2016-06-08 MED ORDER — ACETAMINOPHEN 325 MG PO TABS
650.0000 mg | ORAL_TABLET | Freq: Four times a day (QID) | ORAL | Status: DC | PRN
  Administered 2016-06-09: 650 mg via ORAL
  Filled 2016-06-08: qty 2

## 2016-06-08 MED ORDER — DEXAMETHASONE SODIUM PHOSPHATE 10 MG/ML IJ SOLN
INTRAMUSCULAR | Status: AC
  Filled 2016-06-08: qty 1

## 2016-06-08 MED ORDER — IBUPROFEN-FAMOTIDINE 800-26.6 MG PO TABS: 1.0000 | ORAL_TABLET | Freq: Two times a day (BID) | ORAL | Status: DC

## 2016-06-08 MED ORDER — IBUPROFEN 800 MG PO TABS
800.0000 mg | ORAL_TABLET | Freq: Two times a day (BID) | ORAL | Status: DC
  Administered 2016-06-09 – 2016-06-10 (×2): 800 mg via ORAL
  Filled 2016-06-08 (×2): qty 1

## 2016-06-08 MED ORDER — METHOCARBAMOL 500 MG PO TABS
500.0000 mg | ORAL_TABLET | Freq: Four times a day (QID) | ORAL | Status: DC | PRN
  Administered 2016-06-08 – 2016-06-10 (×2): 500 mg via ORAL
  Filled 2016-06-08 (×2): qty 1

## 2016-06-08 MED ORDER — MIDAZOLAM HCL 5 MG/5ML IJ SOLN
INTRAMUSCULAR | Status: DC | PRN
  Administered 2016-06-08: 2 mg via INTRAVENOUS

## 2016-06-08 MED ORDER — HYDROMORPHONE HCL 1 MG/ML IJ SOLN
1.0000 mg | INTRAMUSCULAR | Status: DC | PRN
  Administered 2016-06-08: 1 mg via INTRAVENOUS
  Filled 2016-06-08: qty 1

## 2016-06-08 MED ORDER — ZOLPIDEM TARTRATE 5 MG PO TABS: 5.0000 mg | ORAL_TABLET | Freq: Every evening | ORAL | Status: DC | PRN

## 2016-06-08 MED ORDER — PROMETHAZINE HCL 25 MG/ML IJ SOLN: 6.2500 mg | INTRAMUSCULAR | Status: DC | PRN

## 2016-06-08 MED ORDER — CITALOPRAM HYDROBROMIDE 20 MG PO TABS
20.0000 mg | ORAL_TABLET | Freq: Every day | ORAL | Status: DC
  Administered 2016-06-09 – 2016-06-10 (×2): 20 mg via ORAL
  Filled 2016-06-08 (×2): qty 1

## 2016-06-08 MED ORDER — MENTHOL 3 MG MT LOZG: 1.0000 | LOZENGE | OROMUCOSAL | Status: DC | PRN

## 2016-06-08 MED ORDER — CEFAZOLIN SODIUM-DEXTROSE 2-4 GM/100ML-% IV SOLN
2.0000 g | INTRAVENOUS | Status: AC
  Administered 2016-06-08: 2 g via INTRAVENOUS

## 2016-06-08 MED ORDER — MIDAZOLAM HCL 2 MG/2ML IJ SOLN
INTRAMUSCULAR | Status: AC
  Filled 2016-06-08: qty 2

## 2016-06-08 MED ORDER — PHENYLEPHRINE 40 MCG/ML (10ML) SYRINGE FOR IV PUSH (FOR BLOOD PRESSURE SUPPORT)
PREFILLED_SYRINGE | INTRAVENOUS | Status: AC
  Filled 2016-06-08: qty 20

## 2016-06-08 MED ORDER — PHENOL 1.4 % MT LIQD: 1.0000 | OROMUCOSAL | Status: DC | PRN

## 2016-06-08 MED ORDER — ACETAMINOPHEN 650 MG RE SUPP: 650.0000 mg | Freq: Four times a day (QID) | RECTAL | Status: DC | PRN

## 2016-06-08 MED ORDER — VERAPAMIL HCL ER 180 MG PO TBCR
180.0000 mg | EXTENDED_RELEASE_TABLET | Freq: Every day | ORAL | Status: DC
  Administered 2016-06-08 – 2016-06-09 (×2): 180 mg via ORAL
  Filled 2016-06-08 (×2): qty 1

## 2016-06-08 MED ORDER — HYDROMORPHONE HCL 1 MG/ML IJ SOLN
INTRAMUSCULAR | Status: AC
  Administered 2016-06-08: 0.5 mg via INTRAVENOUS
  Filled 2016-06-08: qty 1

## 2016-06-08 MED ORDER — POLYETHYLENE GLYCOL 3350 17 G PO PACK: 17.0000 g | PACK | Freq: Every day | ORAL | Status: DC | PRN

## 2016-06-08 MED ORDER — PHENYLEPHRINE HCL 10 MG/ML IJ SOLN
INTRAMUSCULAR | Status: AC
  Filled 2016-06-08: qty 1

## 2016-06-08 MED ORDER — METHOCARBAMOL 1000 MG/10ML IJ SOLN
500.0000 mg | Freq: Four times a day (QID) | INTRAMUSCULAR | Status: DC | PRN
  Administered 2016-06-08: 500 mg via INTRAVENOUS
  Filled 2016-06-08: qty 550
  Filled 2016-06-08: qty 5

## 2016-06-08 SURGICAL SUPPLY — 39 items
APL SKNCLS STERI-STRIP NONHPOA (GAUZE/BANDAGES/DRESSINGS)
BAG SPEC THK2 15X12 ZIP CLS (MISCELLANEOUS)
BAG ZIPLOCK 12X15 (MISCELLANEOUS) IMPLANT
BENZOIN TINCTURE PRP APPL 2/3 (GAUZE/BANDAGES/DRESSINGS) IMPLANT
BLADE SAW SGTL 18X1.27X75 (BLADE) ×2 IMPLANT
CAPT HIP TOTAL 2 ×1 IMPLANT
CELLS DAT CNTRL 66122 CELL SVR (MISCELLANEOUS) ×1 IMPLANT
CLOTH BEACON ORANGE TIMEOUT ST (SAFETY) ×2 IMPLANT
DRAPE STERI IOBAN 125X83 (DRAPES) ×2 IMPLANT
DRAPE U-SHAPE 47X51 STRL (DRAPES) ×4 IMPLANT
DRESSING AQUACEL AG SP 3.5X10 (GAUZE/BANDAGES/DRESSINGS) IMPLANT
DRSG AQUACEL AG ADV 3.5X10 (GAUZE/BANDAGES/DRESSINGS) ×2 IMPLANT
DRSG AQUACEL AG SP 3.5X10 (GAUZE/BANDAGES/DRESSINGS) ×2
DURAPREP 26ML APPLICATOR (WOUND CARE) ×2 IMPLANT
ELECT REM PT RETURN 9FT ADLT (ELECTROSURGICAL) ×2
ELECTRODE REM PT RTRN 9FT ADLT (ELECTROSURGICAL) ×1 IMPLANT
GAUZE XEROFORM 1X8 LF (GAUZE/BANDAGES/DRESSINGS) IMPLANT
GLOVE BIO SURGEON STRL SZ7.5 (GLOVE) ×2 IMPLANT
GLOVE BIOGEL PI IND STRL 8 (GLOVE) ×2 IMPLANT
GLOVE BIOGEL PI INDICATOR 8 (GLOVE) ×2
GLOVE ECLIPSE 8.0 STRL XLNG CF (GLOVE) ×2 IMPLANT
GOWN STRL REUS W/TWL XL LVL3 (GOWN DISPOSABLE) ×4 IMPLANT
HANDPIECE INTERPULSE COAX TIP (DISPOSABLE) ×2
HOLDER FOLEY CATH W/STRAP (MISCELLANEOUS) ×2 IMPLANT
PACK ANTERIOR HIP CUSTOM (KITS) ×2 IMPLANT
RETRACTOR WND ALEXIS 18 MED (MISCELLANEOUS) ×1 IMPLANT
RTRCTR WOUND ALEXIS 18CM MED (MISCELLANEOUS) ×2
SET HNDPC FAN SPRY TIP SCT (DISPOSABLE) ×1 IMPLANT
STAPLER VISISTAT 35W (STAPLE) IMPLANT
STRIP CLOSURE SKIN 1/2X4 (GAUZE/BANDAGES/DRESSINGS) IMPLANT
SUT ETHIBOND NAB CT1 #1 30IN (SUTURE) ×2 IMPLANT
SUT MNCRL AB 4-0 PS2 18 (SUTURE) IMPLANT
SUT VIC AB 0 CT1 36 (SUTURE) ×2 IMPLANT
SUT VIC AB 1 CT1 36 (SUTURE) ×2 IMPLANT
SUT VIC AB 2-0 CT1 27 (SUTURE) ×4
SUT VIC AB 2-0 CT1 TAPERPNT 27 (SUTURE) ×2 IMPLANT
TRAY FOLEY W/METER SILVER 14FR (SET/KITS/TRAYS/PACK) ×2 IMPLANT
TRAY FOLEY W/METER SILVER 16FR (SET/KITS/TRAYS/PACK) ×1 IMPLANT
YANKAUER SUCT BULB TIP NO VENT (SUCTIONS) ×2 IMPLANT

## 2016-06-08 NOTE — Brief Op Note (Signed)
06/08/2016  12:42 PM  PATIENT:  Wendy Crawford  60 y.o. female  PRE-OPERATIVE DIAGNOSIS:  severe osteoarthritis left hip  POST-OPERATIVE DIAGNOSIS:  severe osteoarthritis left hip  PROCEDURE:  Procedure(s): LEFT TOTAL HIP ARTHROPLASTY ANTERIOR APPROACH (Left)  SURGEON:  Surgeon(s) and Role:    * Mcarthur Rossetti, MD - Primary  PHYSICIAN ASSISTANT: Benita Stabile, PA-C  ANESTHESIA:   spinal  EBL:  Total I/O In: 1000 [I.V.:1000] Out: 425 [Urine:75; Blood:350]  COUNTS:  YES  DICTATION: .Other Dictation: Dictation Number [id number]  PLAN OF CARE: Admit to inpatient   PATIENT DISPOSITION:  PACU - hemodynamically stable.   Delay start of Pharmacological VTE agent (>24hrs) due to surgical blood loss or risk of bleeding: no

## 2016-06-08 NOTE — H&P (Signed)
TOTAL HIP ADMISSION H&P  Patient is admitted for left total hip arthroplasty.  Subjective:  Chief Complaint: left hip pain  HPI: Wendy Crawford, 60 y.o. female, has a history of pain and functional disability in the left hip(s) due to arthritis and patient has failed non-surgical conservative treatments for greater than 12 weeks to include NSAID's and/or analgesics, corticosteriod injections, flexibility and strengthening excercises, use of assistive devices, weight reduction as appropriate and activity modification.  Onset of symptoms was gradual starting 6 years ago with gradually worsening course since that time.The patient noted no past surgery on the left hip(s).  Patient currently rates pain in the left hip at 10 out of 10 with activity. Patient has night pain, worsening of pain with activity and weight bearing, trendelenberg gait, pain that interfers with activities of daily living and pain with passive range of motion. Patient has evidence of subchondral cysts, subchondral sclerosis, periarticular osteophytes and joint space narrowing by imaging studies. This condition presents safety issues increasing the risk of falls.  There is no current active infection.  Patient Active Problem List   Diagnosis Date Noted  . Osteoarthritis of left hip 06/08/2016  . Chronic cluster headache, not intractable 09/13/2015  . COPD exacerbation ([place]) 09/13/2015  . Primary snoring 06/17/2014  . 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 Episodic cluster headache   . RECTAL BLEEDING 12/02/2007  . DIARRHEA 12/02/2007  . ABDOMINAL PAIN RIGHT LOWER QUADRANT 12/02/2007  . COLITIS, ULCERATIVE 06/23/2007   Past Medical History  Diagnosis Date  . Psoriasis   . Headache(784.0)     most severe for 2 months,cluster  . Colitis     ulcerative  . 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 Headaches, cluster     02 and medication  . Primary snoring  06/17/2014  . Pneumonia   . Depression   . Arthritis     Past Surgical History  Procedure Laterality Date  . Breast biopsy  1990    No prescriptions prior to admission   Allergies  Allergen Reactions  . Sulfa Antibiotics Itching  . Sulfonamide Derivatives Itching    Social History  Substance Use Topics  . Smoking status: Current Every Day Smoker -- 0.50 packs/day for 42 years  . Smokeless tobacco: Never Used  . Alcohol Use: No    Family History  Problem Relation Age of Onset  . Hypertension Mother   . Diabetes Sister   . Diabetes Brother   . Heart disease Maternal Grandmother   . Heart disease Maternal Grandfather   . Colon cancer Neg Hx      Review of Systems  Musculoskeletal: Positive for joint pain.  All other systems reviewed and are negative.   Objective:  Physical Exam  Constitutional: She is oriented to person, place, and time. She appears well-developed and well-nourished.  HENT:  Head: Normocephalic and atraumatic.  Eyes: EOM are normal. Pupils are equal, round, and reactive to light.  Neck: Normal range of motion. Neck supple.  Cardiovascular: Normal rate and regular rhythm.   Respiratory: Effort normal and breath sounds normal.  GI: Soft. Bowel sounds are normal.  Musculoskeletal:       Left hip: She exhibits decreased range of motion, decreased strength, tenderness, bony tenderness and crepitus.  Neurological: She is alert and oriented to person, place, and time.  Skin: Skin is warm and dry.  Psychiatric: She has a normal mood and affect.    Vital signs in last  24 hours:    Labs:   Estimated body mass index is 44.62 kg/(m^2) as calculated from the following:   Height as of 10/25/15: 5' 2"  (1.575 m).   Weight as of 12/22/15: 110.678 kg (244 lb).   Imaging Review Plain radiographs demonstrate severe degenerative joint disease of the left hip(s). The bone quality appears to be excellent for age and reported activity  level.  Assessment/Plan:  End stage arthritis, left hip(s)  The patient history, physical examination, clinical judgement of the provider and imaging studies are consistent with end stage degenerative joint disease of the left hip(s) and total hip arthroplasty is deemed medically necessary. The treatment options including medical management, injection therapy, arthroscopy and arthroplasty were discussed at length. The risks and benefits of total hip arthroplasty were presented and reviewed. The risks due to aseptic loosening, infection, stiffness, dislocation/subluxation,  thromboembolic complications and other imponderables were discussed.  The patient acknowledged the explanation, agreed to proceed with the plan and consent was signed. Patient is being admitted for inpatient treatment for surgery, pain control, PT, OT, prophylactic antibiotics, VTE prophylaxis, progressive ambulation and ADL's and discharge planning.The patient is planning to be discharged home with home health services

## 2016-06-08 NOTE — Progress Notes (Signed)
RN received report, Pt arrived unit from PACU accompanied by RN. Pt is alert and oriented, able to communicate needs. Will continue with current plan of care.

## 2016-06-08 NOTE — Anesthesia Postprocedure Evaluation (Signed)
Anesthesia Post Note  Patient: Wendy Crawford  Procedure(s) Performed: Procedure(s) (LRB): LEFT TOTAL HIP ARTHROPLASTY ANTERIOR APPROACH (Left)  Patient location during evaluation: PACU Anesthesia Type: General Level of consciousness: awake and alert Pain management: pain level controlled Vital Signs Assessment: post-procedure vital signs reviewed and stable Respiratory status: spontaneous breathing, nonlabored ventilation, respiratory function stable and patient connected to nasal cannula oxygen Cardiovascular status: blood pressure returned to baseline and stable Postop Assessment: no signs of nausea or vomiting Anesthetic complications: no    Last Vitals:  Filed Vitals:   06/08/16 1534 06/08/16 1635  BP: 103/62 118/81  Pulse: 66 73  Temp: 36.4 C 36.4 C  Resp: 16 16    Last Pain:  Filed Vitals:   06/08/16 1714  PainSc: 0-No pain                 Zenaida Deed

## 2016-06-08 NOTE — Anesthesia Procedure Notes (Addendum)
Procedure Name: MAC Date/Time: 06/08/2016 11:10 AM Performed by: West Pugh Pre-anesthesia Checklist: Patient identified, Timeout performed, Emergency Drugs available, Suction available and Patient being monitored Patient Re-evaluated:Patient Re-evaluated prior to inductionOxygen Delivery Method: Simple face mask Dental Injury: Teeth and Oropharynx as per pre-operative assessment    Spinal Patient location during procedure: OR Start time: 06/08/2016 11:07 AM End time: 06/08/2016 11:20 AM Reason for block: at surgeon's request Staffing Resident/CRNA: Christell Faith L Performed by: resident/CRNA  Preanesthetic Checklist Completed: patient identified, site marked, surgical consent, pre-op evaluation, timeout performed, IV checked, risks and benefits discussed, monitors and equipment checked and at surgeon's request Spinal Block Patient position: sitting Prep: ChloraPrep Patient monitoring: heart rate, continuous pulse ox and blood pressure Approach: midline Location: L4-5 Injection technique: single-shot Needle Needle type: Sprotte  Needle gauge: 25 G Needle length: 10 cm Assessment Sensory level: T4 Additional Notes Expiration of kit checked and confirmed. Patient tolerated procedure well,without complications with noted clear CSF for preinjection,midinjection and post injection. Loss of motor and sensory on exam post injection.

## 2016-06-08 NOTE — Anesthesia Preprocedure Evaluation (Addendum)
Anesthesia Evaluation  Patient identified by MRN, date of birth, ID band Patient awake    Reviewed: Allergy & Precautions, NPO status , Patient's Chart, lab work & pertinent test results  Airway Mallampati: I  TM Distance: >3 FB Neck ROM: Full    Dental  (+) Dental Advisory Given, Partial Lower, Upper Dentures   Pulmonary COPD, Current Smoker,    breath sounds clear to auscultation       Cardiovascular negative cardio ROS   Rhythm:Regular Rate:Normal     Neuro/Psych  Headaches, PSYCHIATRIC DISORDERS Depression    GI/Hepatic Neg liver ROS, PUD,   Endo/Other  negative endocrine ROS  Renal/GU negative Renal ROS  negative genitourinary   Musculoskeletal  (+) Arthritis ,   Abdominal (+) + obese,   Peds negative pediatric ROS (+)  Hematology negative hematology ROS (+)   Anesthesia Other Findings   Reproductive/Obstetrics negative OB ROS                            Lab Results  Component Value Date   WBC 4.7 06/01/2016   HGB 13.4 06/01/2016   HCT 40.8 06/01/2016   MCV 90.1 06/01/2016   PLT 227 06/01/2016   Lab Results  Component Value Date   CREATININE 0.68 06/01/2016   BUN 15 06/01/2016   NA 141 06/01/2016   K 5.0 06/01/2016   CL 106 06/01/2016   CO2 28 06/01/2016   Lab Results  Component Value Date   INR 0.93 08/29/2010   05/2016 EKG: normal sinus rhythm.   Anesthesia Physical Anesthesia Plan  ASA: III  Anesthesia Plan: Spinal   Post-op Pain Management:    Induction: Intravenous  Airway Management Planned: Natural Airway and Nasal Cannula  Additional Equipment:   Intra-op Plan:   Post-operative Plan:   Informed Consent: I have reviewed the patients History and Physical, chart, labs and discussed the procedure including the risks, benefits and alternatives for the proposed anesthesia with the patient or authorized representative who has indicated his/her  understanding and acceptance.     Plan Discussed with: CRNA  Anesthesia Plan Comments:         Anesthesia Quick Evaluation

## 2016-06-08 NOTE — Transfer of Care (Signed)
Immediate Anesthesia Transfer of Care Note  Patient: Wendy Crawford  Procedure(s) Performed: Procedure(s): LEFT TOTAL HIP ARTHROPLASTY ANTERIOR APPROACH (Left)  Patient Location: PACU  Anesthesia Type:MAC and Spinal  Level of Consciousness:  sedated, patient cooperative and responds to stimulation  Airway & Oxygen Therapy:Patient Spontanous Breathing and Patient connected to face mask oxgen  Post-op Assessment:  Report given to PACU RN and Post -op Vital signs reviewed and stable  Post vital signs:  Reviewed and stable  Last Vitals:  Filed Vitals:   06/08/16 0935  BP: 149/74  Pulse: 79  Temp: 36.8 C  Resp: 20    Complications: No apparent anesthesia complications

## 2016-06-09 LAB — BASIC METABOLIC PANEL
Anion gap: 4 — ABNORMAL LOW (ref 5–15)
BUN: 18 mg/dL (ref 6–20)
CO2: 25 mmol/L (ref 22–32)
Calcium: 8.4 mg/dL — ABNORMAL LOW (ref 8.9–10.3)
Chloride: 105 mmol/L (ref 101–111)
Creatinine, Ser: 0.73 mg/dL (ref 0.44–1.00)
GFR calc Af Amer: >60 mL/min (ref >60)
GFR calc non Af Amer: >60 mL/min (ref >60)
Glucose, Bld: 112 mg/dL — ABNORMAL HIGH (ref 65–99)
Potassium: 3.9 mmol/L (ref 3.5–5.1)
Sodium: 134 mmol/L — ABNORMAL LOW (ref 135–145)

## 2016-06-09 LAB — CBC
HCT: 33.8 % — ABNORMAL LOW (ref 36.0–46.0)
Hemoglobin: 11.3 g/dL — ABNORMAL LOW (ref 12.0–15.0)
MCH: 29.6 pg (ref 26.0–34.0)
MCHC: 33.4 g/dL (ref 30.0–36.0)
MCV: 88.5 fL (ref 78.0–100.0)
Platelets: 186 10*3/uL (ref 150–400)
RBC: 3.82 MIL/uL — ABNORMAL LOW (ref 3.87–5.11)
RDW: 13.3 % (ref 11.5–15.5)
WBC: 7.6 10*3/uL (ref 4.0–10.5)

## 2016-06-09 MED ORDER — HYDROCODONE-ACETAMINOPHEN 7.5-325 MG PO TABS: 1.0000 | ORAL_TABLET | ORAL | Status: DC | PRN

## 2016-06-09 MED ORDER — HYDROCODONE-ACETAMINOPHEN 7.5-325 MG PO TABS: 1.0000 | ORAL_TABLET | Freq: Four times a day (QID) | ORAL | Status: DC | PRN

## 2016-06-09 MED ORDER — METHOCARBAMOL 500 MG PO TABS: 500.0000 mg | ORAL_TABLET | Freq: Four times a day (QID) | ORAL | Status: DC | PRN

## 2016-06-09 MED ORDER — ASPIRIN 325 MG PO TBEC: 325.0000 mg | DELAYED_RELEASE_TABLET | Freq: Two times a day (BID) | ORAL | Status: DC

## 2016-06-09 NOTE — Op Note (Signed)
NAMEMAEGEN, WIGLE               ACCOUNT NO.:  [id number]  MEDICAL RECORD NO.:  [id number]  LOCATION:  1501                         FACILITY:  [facility]  PHYSICIAN:  Lind Guest. Ninfa Linden, M.D.DATE OF BIRTH:  1956/06/01  DATE OF PROCEDURE:  06/08/2016 DATE OF DISCHARGE:                              OPERATIVE REPORT   PREOPERATIVE DIAGNOSIS:  Primary osteoarthritis and degenerative joint disease of left hip.  POSTOPERATIVE DIAGNOSIS:  Primary osteoarthritis and degenerative joint disease of left hip.  PROCEDURE:  Left total hip arthroplasty through direct anterior approach.  IMPLANTS:  DePuy Sector Gription acetabular component size 50, size 32+ 0 neutral polyethylene liner, size 12 Corail femoral component with standard offset, size 32+ 1 ceramic hip ball.  SURGEON:  Lind Guest. Ninfa Linden, M.D.  ASSISTANT:  Erskine Emery, PA-C.  ANESTHESIA:  Spinal.  ANTIBIOTICS:  2 g of IV Ancef.  BLOOD LOSS:  350 mL.  COMPLICATIONS:  None.  INDICATIONS:  [name] is a morbidly obese 60 year old pleasant female, who had debilitating arthritis involving her left hip.  Her x- ray showed almost complete loss of the joint space, periarticular osteophytes, severe sclerotic changes and joint space narrowing.  Her pain is daily and it is detrimentally affected her activities of daily living, her mobility and her quality of life.  At this point, she does wish to proceed with total hip arthroplasty through direct anterior approach.  She understands the risk of acute blood loss anemia, nerve and vessel injury, fracture, infection, dislocation, and DVT.  She understands our goals are decreased pain, improved mobility and overall improved quality of life.  PROCEDURE DESCRIPTION:  After informed consent was obtained, appropriate left hip was marked.  She was brought to the operating room, spinal anesthesia was obtained while she was on her stretcher.  She was laid in a supine position on a  stretcher.  After that, a Foley catheter was placed and then, both feet had traction boots applied to them.  Next, she was placed supine on the Hana fracture table with the perineal post in place and both legs in inline skeletal traction devices, but no traction applied.  Her left operative hip was then prepped and draped with DuraPrep and sterile drapes.  Time-out was called.  She was identified as correct patient and correct left hip.  We then made an incision inferior and posterior to the anterior superior iliac spine and carried this slightly obliquely down the leg.  We dissected down the tensor fascia lata muscle and the tensor fascia was then divided longitudinally, so we could proceed with direct anterior approach to the hip.  We identified and cauterized the circumflex vessels and then identified the hip capsule.  I opened the hip capsule in L-type format, finding a moderate joint effusion.  We placed our Cobra retractors within joint capsule and then made our femoral neck cut proximal to the lesser trochanter with an oscillating saw and completed this with an osteotome.  We placed a corkscrew guide in the femoral head and removed the femoral head in its entirety and found it to be devoid of cartilage. We then placed a bent Hohmann over the medial acetabular rim and removed remnants of  the acetabular labrum.  We then began reaming under direct visualization from a size 42 reamer up to a size 50, with all reamers under direct visualization and the last reamer also under direct fluoroscopy, so, we could obtain our depth of reaming, our inclination and anteversion.  Once I was pleased with this, we placed the real DePuy Sector Gription acetabular component size 50 and a 32+ 0 neutral polyethylene liner for that size acetabular component.  Attention was then turned to the femur.  With the leg externally rotated to 120 degrees, extended and adducted, we were able to place a  Mueller retractor medially and a Hohmann retractor behind the greater trochanter.  We released the lateral joint capsule and used a box cutting osteotome to enter the femoral canal and a rongeur to lateralize.  We then began broaching from a size 8 broach using the Corail broaching system from DePuy going up to a size 12.  With the size 12 in place, we trialed a standard offset femoral neck and a 32+ 1 hip ball.  We brought the leg back over and up with traction and internal rotation, reducing the pelvis.  We were pleased with leg length, offset and stability under fluoro and physical exam.  We then dislocated the hip and removed the trial components.  We then able to place the real Corail femoral component size 12 with standard offset and the real 32+ 1 ceramic hip ball.  We reduced this in the acetabulum and again, we were pleased with leg length, offset, stability and range of motion.  We then irrigated the soft tissue with normal saline solution using pulsatile lavage.  We were able to close the joint capsule with interrupted #1 Ethibond suture followed by running #1 Vicryl in the tensor fascia, 0 Vicryl in the deep tissue, 2-0 Vicryl in the subcutaneous tissue, interrupted staples on the skin.  Xeroform and Aquacel dressing were applied.  She was then taken off the Hana table, taken to the recovery room in stable condition.  All final counts were correct.  There were no complications noted.  Of note, Erskine Emery, PA-C assisted in the entire case.  His assistance was crucial for facilitating all aspects of this case.     Lind Guest. Ninfa Linden, M.D.     CYB/MEDQ  D:  06/08/2016  T:  06/09/2016  Job:  [id number]

## 2016-06-09 NOTE — Evaluation (Signed)
Occupational Therapy Evaluation Patient Details Name: Wendy Crawford MRN: [id number] DOB: 17-Sep-1956 Today's Date: 06/09/2016    History of Present Illness [name]   Clinical Impression   This 60 year old female was admitted for the above sx. Evaluation was limited by nausea. Will return to review ADLs, AE and further assess DME needs for her high commode.      Follow Up Recommendations  Supervision/Assistance - 24 hour    Equipment Recommendations   (to be further assessed: pt has a high commode)    Recommendations for Other Services       Precautions / Restrictions Precautions Precautions: Fall Restrictions LLE Weight Bearing: Weight bearing as tolerated      Mobility Bed Mobility Overal bed mobility: Needs Assistance Bed Mobility: Supine to Sit     Supine to sit: Min assist Sit to supine: Min assist   General bed mobility comments: assist for LLE  Transfers Overall transfer level: Needs assistance Equipment used: Rolling walker (2 wheeled) Transfers: Sit to/from [facility] Sit to Stand: Min guard Stand pivot transfers: Min guard       General transfer comment: for safety. Cues for LLE placement    Balance                                            ADL Overall ADL's : Needs assistance/impaired                         Toilet Transfer: Min guard;Stand-pivot;RW;BSC   Toileting- Clothing Manipulation and Hygiene: Min guard;Sit to/from stand         General ADL Comments: evaluation was limited by nausea.  Pt performed SPT to BSC only.  She had wanted to walk to bathroom but got hot and nauseous as soon as she stood.  She needs set up for UB adls and max A for LB due to nausea. Will continue to assess on next visit and see whether she can get up from her higher commode without DME     Vision     Perception     Praxis      Pertinent Vitals/Pain Pain Assessment: 0-10 Pain Score: 4  Pain Location: L  hip Pain Descriptors / Indicators: Tightness Pain Intervention(s): Limited activity within patient's tolerance;Monitored during session;Premedicated before session;Repositioned;Ice applied     Hand Dominance     Extremity/Trunk Assessment Upper Extremity Assessment Upper Extremity Assessment: Overall WFL for tasks assessed          Communication Communication Communication: No difficulties   Cognition Arousal/Alertness: Awake/alert Behavior During Therapy: WFL for tasks assessed/performed Overall Cognitive Status: Within Functional Limits for tasks assessed                     General Comments       Exercises       Shoulder Instructions      Home Living Family/patient expects to be discharged to:: Private residence Living Arrangements: Spouse/significant other;Other relatives Available Help at Discharge: Family;Available 24 hours/day Type of Home: House Home Access: Stairs to enter [employer] of Steps: 1 Entrance Stairs-Rails: None Home Layout: One level     Bathroom Shower/Tub: Teacher, early years/pre: Handicapped height     Home Equipment: Environmental consultant - 2 wheels   Additional Comments: has a tub bench she loaned out,  which she can get back      Prior Functioning/Environment Level of Independence: Independent             OT Diagnosis: Acute pain   OT Problem List: Decreased activity tolerance;Decreased knowledge of use of DME or AE;Pain   OT Treatment/Interventions: Self-care/ADL training;DME and/or AE instruction;Patient/family education    OT Goals(Current goals can be found in the care plan section) Acute Rehab OT Goals Patient Stated Goal: to go home , walk without assistance OT Goal Formulation: With patient Time For Goal Achievement: 06/16/16 Potential to Achieve Goals: Good  OT Frequency: Min 2X/week   Barriers to D/C:            Co-evaluation              End of Session Nurse Communication:   (nausea)  Activity Tolerance: Other (comment) (nausea) Patient left: in bed;with call bell/phone within reach;with family/visitor present   Time: 2224-1146 OT Time Calculation (min): 20 min Charges:  OT General Charges $OT Visit: 1 Procedure OT Evaluation $OT Eval Low Complexity: 1 Procedure G-Codes:    Keeghan Bialy July 02, 2016, 11:18 AM Lesle Chris, OTR/L [phone] 07/02/16

## 2016-06-09 NOTE — Discharge Instructions (Signed)

## 2016-06-09 NOTE — Evaluation (Signed)
Physical Therapy Evaluation Patient Details Name: Wendy Crawford MRN: [id number] DOB: August 31, 1956 Today's Date: 06/09/2016   History of Present Illness  [name]  Clinical Impression  The patient  Felt a little dizzy, BP WNL. Ambulated to the Recliner without further problems.Pt admitted with above diagnosis. Pt currently with functional limitations due to the deficits listed below (see PT Problem List).  Pt will benefit from skilled PT to increase their independence and safety with mobility to allow discharge to the venue listed below.       Follow Up Recommendations Home health PT;Supervision/Assistance - 24 hour    Equipment Recommendations  None recommended by PT    Recommendations for Other Services       Precautions / Restrictions Precautions Precautions: Fall Restrictions LLE Weight Bearing: Weight bearing as tolerated      Mobility  Bed Mobility Overal bed mobility: Needs Assistance Bed Mobility: Supine to Sit     Supine to sit: Min assist     General bed mobility comments: cues for technique  Transfers Overall transfer level: Needs assistance Equipment used: Rolling walker (2 wheeled) Transfers: Sit to/from Stand Sit to Stand: Min assist         General transfer comment: cues for hand and L leg position  Ambulation/Gait Ambulation/Gait assistance: Min assist Ambulation Distance (Feet): 5 Feet Assistive device: Rolling walker (2 wheeled) Gait Pattern/deviations: Step-to pattern     General Gait Details: c/o dizziness, limited to transfer to recliner . BP 125/61.   Stairs            Wheelchair Mobility    Modified Rankin (Stroke Patients Only)       Balance                                             Pertinent Vitals/Pain Pain Assessment: 0-10 Pain Score: 4  Pain Location: L  hip Pain Descriptors / Indicators: Tightness;Sore Pain Intervention(s): RN gave pain meds during session;Ice applied;Repositioned;Monitored  during session    Home Living Family/patient expects to be discharged to:: Private residence Living Arrangements: Spouse/significant other;Other relatives Available Help at Discharge: Family;Available 24 hours/day Type of Home: House Home Access: Stairs to enter Entrance Stairs-Rails: None Entrance Stairs-Number of Steps: 1 Home Layout: One level Home Equipment: Walker - 2 wheels      Prior Function Level of Independence: Independent               Hand Dominance        Extremity/Trunk Assessment   Upper Extremity Assessment: Defer to OT evaluation           Lower Extremity Assessment: LLE deficits/detail   LLE Deficits / Details: advances the leg, able to self assist flexing the hip     Communication   Communication: No difficulties  Cognition Arousal/Alertness: Awake/alert Behavior During Therapy: WFL for tasks assessed/performed Overall Cognitive Status: Within Functional Limits for tasks assessed                      General Comments      Exercises Total Joint Exercises Ankle Circles/Pumps: AROM;Both;10 reps Heel Slides: AAROM;Left;10 reps Hip ABduction/ADduction: AAROM;Left;10 reps      Assessment/Plan    PT Assessment Patient needs continued PT services  PT Diagnosis Difficulty walking;Acute pain   PT Problem List Decreased strength;Decreased activity tolerance;Decreased mobility;Decreased knowledge of precautions;Decreased safety awareness;Decreased  range of motion;Decreased knowledge of use of DME;Pain  PT Treatment Interventions DME instruction;Gait training;Stair training;Functional mobility training;Therapeutic activities;Therapeutic exercise;Patient/family education   PT Goals (Current goals can be found in the Care Plan section) Acute Rehab PT Goals Patient Stated Goal: to go home , walk without assistance PT Goal Formulation: With patient Time For Goal Achievement: 06/12/16 Potential to Achieve Goals: Good    Frequency  7X/week   Barriers to discharge        Co-evaluation               End of Session   Activity Tolerance: Patient tolerated treatment well Patient left: in chair;with call bell/phone within reach;with family/visitor present Nurse Communication: Mobility status         Time: 4037-5436 PT Time Calculation (min) (ACUTE ONLY): 30 min   Charges:   PT Evaluation $PT Eval Low Complexity: 1 Procedure PT Treatments $Gait Training: 8-22 mins   PT G Codes:        [name] 06/09/2016, 9:54 AM Tresa Endo PT [phone]

## 2016-06-09 NOTE — Progress Notes (Signed)
Physical Therapy Treatment Patient Details Name: Wendy Crawford MRN: [id number] DOB: 07/29/1956 Today's Date: 06/09/2016    History of Present Illness Wendy Crawford    PT Comments    The patient reports some nausea. Progressing with mobility and ambulatiopn and reports only burning and soreness. Plans  For DC tomorrow if progressing well.  Follow Up Recommendations  Home health PT;Supervision/Assistance - 24 hour     Equipment Recommendations  None recommended by PT    Recommendations for Other Services       Precautions / Restrictions Precautions Precautions: None Restrictions LLE Weight Bearing: Weight bearing as tolerated    Mobility  Bed Mobility   Bed Mobility: Supine to Sit     Supine to sit: Supervision Sit to supine: Min assist   General bed mobility comments: assist for LLE  Transfers   Equipment used: Rolling walker (2 wheeled) Transfers: Sit to/from Stand Sit to Stand: Supervision Stand pivot transfers: Min guard       General transfer comment: for safety. Cues for LLE placement  Ambulation/Gait Ambulation/Gait assistance: Supervision Ambulation Distance (Feet): 200 Feet   Gait Pattern/deviations: Step-through pattern     General Gait Details: gait is smoothe, able to advance the L leg   Stairs            Wheelchair Mobility    Modified Rankin (Stroke Patients Only)       Balance                                    Cognition Arousal/Alertness: Awake/alert Behavior During Therapy: WFL for tasks assessed/performed Overall Cognitive Status: Within Functional Limits for tasks assessed                      Exercises Total Joint Exercises Heel Slides: AAROM;10 reps;Supine Long Arc Quad: AROM;Left;10 reps;Seated    General Comments        Pertinent Vitals/Pain Pain Score: 4  Pain Location: L thigh Pain Descriptors / Indicators: Sore;Burning Pain Intervention(s): Monitored during session;Repositioned;Ice  applied    Home Living Family/patient expects to be discharged to:: Private residence Living Arrangements: Spouse/significant other;Other relatives Available Help at Discharge: Family;Available 24 hours/day         Home Equipment: Walker - 2 wheels Additional Comments: has a tub bench she loaned out, which she can get back    Prior Function Level of Independence: Independent          PT Goals (current goals can now be found in the care plan section) Acute Rehab PT Goals Patient Stated Goal: to go home , walk without assistance Progress towards PT goals: Progressing toward goals    Frequency  7X/week    PT Plan Current plan remains appropriate    Co-evaluation             End of Session   Activity Tolerance: Patient tolerated treatment well Patient left: in chair;with call bell/phone within reach;with family/visitor present     Time: 7342-8768 PT Time Calculation (min) (ACUTE ONLY): 24 min  Charges:  $Gait Training: 8-22 mins $Self Care/Home Management: 8-22                    G Codes:      Wendy Crawford 06/09/2016, 2:32 PM

## 2016-06-09 NOTE — Care Management Note (Signed)
Case Management Note Patient Details  Name: Wendy Crawford MRN: [id number] Date of Birth: Dec 02, 1956  Subjective/Objective:    Left total hip arthroplasty                 Action/Plan: Discharge Planning: NCM spoke to pt and offered choice for [facility] Pt agreeable to [facility] for [facility] PT. Has RW and bedside commode at home.   PCP- Jenna Luo  MD Expected Discharge Date:                  Expected Discharge Plan:  [place]  In-House Referral:  NA  Discharge planning Services  CM Consult  Post Acute Care Choice:  Home Health Choice offered to:  Patient  DME Arranged:  N/A DME Agency:  NA  HH Arranged:  PT [place] Agency:  [facility] (now [facility]  Status of Service:  Completed, signed off  If discussed at [place] of Stay Meetings, dates discussed:    Additional Comments:  Erenest Rasher, RN 06/09/2016, 1:36 PM

## 2016-06-09 NOTE — Progress Notes (Addendum)
Subjective: 1 Day Post-Op Procedure(s) (LRB): LEFT TOTAL HIP ARTHROPLASTY ANTERIOR APPROACH (Left) Patient reports pain as moderate.    Objective: Vital signs in last 24 hours: Temp:  [97.5 F (36.4 C)-99.6 F (37.6 C)] 99.1 F (37.3 C) (07/08 0941) Pulse Rate:  [59-85] 75 (07/08 0941) Resp:  [8-18] 18 (07/08 0941) BP: (102-125)/(60-86) 125/61 mmHg (07/08 0941) SpO2:  [93 %-100 %] 95 % (07/08 0941)  Intake/Output from previous day: 07/07 0701 - 07/08 0700 In: 1720 [P.O.:120; I.V.:1600] Out: 1125 [Urine:775; Blood:350] Intake/Output this shift: Total I/O In: 240 [P.O.:240] Out: -    Recent Labs  06/09/16 0445  HGB 11.3*    Recent Labs  06/09/16 0445  WBC 7.6  RBC 3.82*  HCT 33.8*  PLT 186    Recent Labs  06/09/16 0445  NA 134*  K 3.9  CL 105  CO2 25  BUN 18  CREATININE 0.73  GLUCOSE 112*  CALCIUM 8.4*   No results for input(s): LABPT, INR in the last 72 hours.  Neurologically intact dressing dry  Assessment/Plan: 1 Day Post-Op Procedure(s) (LRB): LEFT TOTAL HIP ARTHROPLASTY ANTERIOR APPROACH (Left) Up with therapy nausea has been a problem  Riccardo Holeman C 06/09/2016, 12:09 PM

## 2016-06-10 NOTE — Progress Notes (Signed)
Occupational Therapy Treatment Patient Details Name: Wendy Crawford MRN: [id number] DOB: December 08, 1955 Today's Date: 06/10/2016    History of present illness Wendy Crawford   OT comments  All education was completed. No further OT needs at this time  Follow Up Recommendations  Supervision/Assistance - 24 hour    Equipment Recommendations  None recommended by OT    Recommendations for Other Services      Precautions / Restrictions Precautions Precautions: None Restrictions LLE Weight Bearing: Weight bearing as tolerated       Mobility Bed Mobility               General bed mobility comments: oob  Transfers   Equipment used: Rolling walker (2 wheeled) Transfers: Sit to/from Stand Sit to Stand: Supervision         General transfer comment: cue for UE placement    Balance                                   ADL                       Lower Body Dressing: Supervision/safety;Sit to/from stand;With adaptive equipment (reacher for pants and underwear)   Toilet Transfer: Supervision/safety;Ambulation;RW;Comfort height toilet   Toileting- Clothing Manipulation and Hygiene: Supervision/safety;Sit to/from stand         General ADL Comments: pt did not want to try sock aide:  will have assistance; already completed bathing      Vision                     Perception     Praxis      Cognition   Behavior During Therapy: [facility] for tasks assessed/performed Overall Cognitive Status: Within Functional Limits for tasks assessed                       Extremity/Trunk Assessment               Exercises     Shoulder Instructions       General Comments      Pertinent Vitals/ Pain       Pain Score: 4  Pain Location: L thigh Pain Descriptors / Indicators: Sore Pain Intervention(s): Limited activity within patient's tolerance;Monitored during session;Repositioned  Home Living                                           Prior Functioning/Environment              Frequency       Progress Toward Goals  OT Goals(current goals can now be found in the care plan section)  Progress towards OT goals: Goals met/education completed, patient discharged from OT  ADL Goals Pt Will Perform Lower Body Bathing: with supervision;with adaptive equipment;sit to/from stand Pt Will Perform Lower Body Dressing: with min assist;with adaptive equipment;sit to/from stand Pt Will Transfer to Toilet: with min guard assist;ambulating (comfort height commode with counter vs 3:1)  Plan      Co-evaluation                 End of Session     Activity Tolerance Patient tolerated treatment well   Patient Left in chair;with call bell/phone within reach;with family/visitor present;with nursing/sitter in room  Nurse Communication          Time: 4010-2725 OT Time Calculation (min): 19 min  Charges: OT General Charges $OT Visit: 1 Procedure OT Treatments $Self Care/Home Management : 8-22 mins  Wendy Crawford 06/10/2016, 9:34 AM  Wendy Crawford, OTR/L [phone] 06/10/2016

## 2016-06-10 NOTE — Progress Notes (Signed)
Physical Therapy Treatment Patient Details Name: Wendy Crawford MRN: [id number] DOB: July 27, 1956 Today's Date: 06/10/2016    History of Present Illness [name]    PT Comments    Pt progressing well and eager for dc home.  Reviewed therex, car transfers and stairs.  Follow Up Recommendations  Home health PT;Supervision/Assistance - 24 hour     Equipment Recommendations  None recommended by PT    Recommendations for Other Services       Precautions / Restrictions Precautions Precautions: None Restrictions Weight Bearing Restrictions: No LLE Weight Bearing: Weight bearing as tolerated    Mobility  Bed Mobility Overal bed mobility: Needs Assistance Bed Mobility: Supine to Sit;Sit to Supine     Supine to sit: Supervision Sit to supine: Supervision   General bed mobility comments: Pt self assisting L LE with R LE  Transfers Overall transfer level: Needs assistance Equipment used: Rolling walker (2 wheeled) Transfers: Sit to/from Stand Sit to Stand: Supervision         General transfer comment: cue for UE placement  Ambulation/Gait Ambulation/Gait assistance: Min guard;Supervision Ambulation Distance (Feet): 150 Feet Assistive device: Rolling walker (2 wheeled) Gait Pattern/deviations: Step-to pattern;Step-through pattern;Decreased step length - right;Decreased step length - left;Shuffle;Trunk flexed Gait velocity: decr Gait velocity interpretation: Below normal speed for age/gender General Gait Details: cues for posture and position from RW   Stairs Stairs: Yes Stairs assistance: Min assist Stair Management: No rails;Step to pattern;Forwards;Backwards;With walker Number of Stairs: 3 General stair comments: single step twice fwd and once bkwd with RW and cues for sequence and foot/RW placement  Wheelchair Mobility    Modified Rankin (Stroke Patients Only)       Balance                                    Cognition Arousal/Alertness:  Awake/alert Behavior During Therapy: WFL for tasks assessed/performed Overall Cognitive Status: Within Functional Limits for tasks assessed                      Exercises Total Joint Exercises Ankle Circles/Pumps: AROM;Both;10 reps Quad Sets: AROM;Both;10 reps;Supine Heel Slides: AAROM;Supine;20 reps;Left Hip ABduction/ADduction: AAROM;Left;20 reps;Supine Long Arc Quad: AROM;Left;10 reps;Seated    General Comments        Pertinent Vitals/Pain Pain Assessment: 0-10 Pain Score: 4  Pain Location: L hip/thigh Pain Descriptors / Indicators: Aching;Sore;Burning Pain Intervention(s): Limited activity within patient's tolerance;Monitored during session;Ice applied;Patient requesting pain meds-RN notified    Home Living                      Prior Function            PT Goals (current goals can now be found in the care plan section) Acute Rehab PT Goals Patient Stated Goal: to go home , walk without assistance PT Goal Formulation: With patient Time For Goal Achievement: 06/12/16 Potential to Achieve Goals: Good Progress towards PT goals: Progressing toward goals    Frequency  7X/week    PT Plan Current plan remains appropriate    Co-evaluation             End of Session Equipment Utilized During Treatment: Gait belt Activity Tolerance: Patient tolerated treatment well Patient left: in bed     Time: 0830-0905 PT Time Calculation (min) (ACUTE ONLY): 35 min  Charges:  $Gait Training: 8-22 mins $Therapeutic Exercise: 8-22 mins  G Codes:      Rowdy Guerrini 2016-07-01, 12:30 PM

## 2016-06-10 NOTE — Progress Notes (Signed)
Subjective: 2 Days Post-Op Procedure(s) (LRB): LEFT TOTAL HIP ARTHROPLASTY ANTERIOR APPROACH (Left) Patient reports pain as mild.    Objective: Vital signs in last 24 hours: Temp:  [97.5 F (36.4 C)-102.2 F (39 C)] 98.8 F (37.1 C) (07/09 0608) Pulse Rate:  [79-94] 79 (07/09 0608) Resp:  [16-18] 18 (07/09 0608) BP: (112-127)/(60-64) 123/63 mmHg (07/09 0608) SpO2:  [92 %-100 %] 100 % (07/09 4171)  Intake/Output from previous day: 07/08 0701 - 07/09 0700 In: 480 [P.O.:480] Out: 1000 [Urine:1000] Intake/Output this shift:     Recent Labs  06/09/16 0445  HGB 11.3*    Recent Labs  06/09/16 0445  WBC 7.6  RBC 3.82*  HCT 33.8*  PLT 186    Recent Labs  06/09/16 0445  NA 134*  K 3.9  CL 105  CO2 25  BUN 18  CREATININE 0.73  GLUCOSE 112*  CALCIUM 8.4*   No results for input(s): LABPT, INR in the last 72 hours.  Neurologically intact  Assessment/Plan: 2 Days Post-Op Procedure(s) (LRB): LEFT TOTAL HIP ARTHROPLASTY ANTERIOR APPROACH (Left) Discharge home with home health  Brison Fiumara C 06/10/2016, 11:33 AM

## 2016-06-10 NOTE — Progress Notes (Signed)
Patient discharged.  Leaving with prescriptions and personal belongings.  Denies pain.  Accompanied by husband and sister.  Reports understanding of discharge instructions.  Home Health will follow-up with patient.  Room air.  No s/s of distress.  No complaints.

## 2016-06-11 NOTE — Discharge Summary (Signed)
Patient ID: Wendy Crawford MRN: [id number] DOB/AGE: October 25, 1956 60 y.o.  Admit date: 06/08/2016 Discharge date: 06/11/2016  Admission Diagnoses:  Principal Problem:   Osteoarthritis of left hip Active Problems:   Status post total replacement of left hip   Discharge Diagnoses:  Same  Past Medical History  Diagnosis Date  . Psoriasis   . Headache(784.0)     most severe for 2 months,cluster  . Colitis     ulcerative  . 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 Headaches, cluster     02 and medication  . Primary snoring 06/17/2014  . Pneumonia   . Depression   . Arthritis     Surgeries: Procedure(s): LEFT TOTAL HIP ARTHROPLASTY ANTERIOR APPROACH on 06/08/2016   Consultants:    Discharged Condition: Improved  Hospital Course: Wendy Crawford is an 60 y.o. female who was admitted 06/08/2016 for operative treatment ofOsteoarthritis of left hip. Patient has severe unremitting pain that affects sleep, daily activities, and work/hobbies. After pre-op clearance the patient was taken to the operating room on 06/08/2016 and underwent  Procedure(s): LEFT TOTAL HIP ARTHROPLASTY ANTERIOR APPROACH.    Patient was given perioperative antibiotics: Anti-infectives    Start     Dose/Rate Route Frequency Ordered Stop   06/08/16 1700  ceFAZolin (ANCEF) IVPB 1 g/50 mL premix     1 g 100 mL/hr over 30 Minutes Intravenous Every 6 hours 06/08/16 1502 06/08/16 2324   06/08/16 0921  ceFAZolin (ANCEF) IVPB 2g/100 mL premix     2 g 200 mL/hr over 30 Minutes Intravenous On call to O.R. 06/08/16 0921 06/08/16 1130       Patient was given sequential compression devices, early ambulation, and chemoprophylaxis to prevent DVT.  Patient benefited maximally from hospital stay and there were no complications.    Recent vital signs: No data found.    Recent laboratory studies:  Recent Labs  06/09/16 0445  WBC 7.6  HGB 11.3*  HCT 33.8*  PLT 186  NA 134*  K 3.9  CL 105   CO2 25  BUN 18  CREATININE 0.73  GLUCOSE 112*  CALCIUM 8.4*     Discharge Medications:     Medication List    TAKE these medications        acetaminophen 500 MG tablet  Commonly known as:  TYLENOL  Take 500 mg by mouth every 6 (six) hours as needed for moderate pain.     aspirin 325 MG EC tablet  Take 1 tablet (325 mg total) by mouth 2 (two) times daily after a meal.     cefdinir 300 MG capsule  Commonly known as:  OMNICEF  Take 1 capsule (300 mg total) by mouth 2 (two) times daily.     citalopram 20 MG tablet  Commonly known as:  CELEXA  Take 1 tablet (20 mg total) by mouth daily.     DUEXIS 800-26.6 MG Tabs  Generic drug:  Ibuprofen-Famotidine  Take 1 tablet by mouth 2 (two) times daily.     HYDROcodone-acetaminophen 7.5-325 MG tablet  Commonly known as:  NORCO  Take 1-2 tablets by mouth every 4 (four) hours as needed for moderate pain.     methocarbamol 500 MG tablet  Commonly known as:  ROBAXIN  Take 1 tablet (500 mg total) by mouth every 6 (six) hours as needed for muscle spasms.     OVER THE COUNTER MEDICATION  Take 2 tablets by mouth daily. Tumeric daily     predniSONE  20 MG tablet  Commonly known as:  DELTASONE  3 tabs poqday 1-2, 2 tabs poqday 3-4, 1 tab poqday 5-6     verapamil 180 MG CR tablet  Commonly known as:  CALAN-SR  Take 1 tablet (180 mg total) by mouth at bedtime.        Diagnostic Studies: Dg C-arm 1-60 Min-no Report  06/08/2016  CLINICAL DATA: surgery C-ARM 1-60 MINUTES Fluoroscopy was utilized by the requesting physician.  No radiographic interpretation.   Dg Hip Port Unilat With Pelvis 1v Left  06/08/2016  CLINICAL DATA:  Status post total hip replacement on the left EXAM: DG HIP (WITH OR WITHOUT PELVIS) 2V PORT LEFT COMPARISON:  Intraoperative images obtained earlier in the day FINDINGS: Frontal pelvis and lateral left hip images were obtained. Patient is status post total hip replacement on the left with prosthetic components  appearing well-seated. No acute fracture or dislocation. Right hip joint appears unremarkable. IMPRESSION: Total hip prosthetic components on the left appear well seated. No fracture or dislocation. Right hip joint appears unremarkable. Electronically Signed   By: Lowella Grip III M.D.   On: 06/08/2016 13:58   Dg Hip Operative Unilat With Pelvis Left  06/08/2016  CLINICAL DATA:  Left hip replacement EXAM: OPERATIVE LEFT HIP (WITH PELVIS IF PERFORMED) 2 VIEWS TECHNIQUE: Fluoroscopic spot image(s) were submitted for interpretation post-operatively. FLUOROSCOPY TIME:  6.7 mGy COMPARISON:  None. FINDINGS: Interval left hip arthroplasty without failure complication. No dislocation. IMPRESSION: Left total hip arthroplasty. Electronically Signed   By: Kathreen Devoid   On: 06/08/2016 12:44    Disposition: 06-Home-Health Care Svc        Follow-up Information    Follow up with [facility].   Why:  Home Health Physical Therapy   Contact information:   [address] [place] [address] [phone]       Follow up with Mcarthur Rossetti, MD In 2 weeks.   Specialty:  Orthopedic Surgery   Contact information:   [address] [id number] [phone]        Signed: Mcarthur Rossetti 06/11/2016, 12:05 PM

## 2016-10-13 ENCOUNTER — Other Ambulatory Visit: Payer: Self-pay | Admitting: Neurology

## 2016-10-23 ENCOUNTER — Encounter: Payer: Self-pay | Admitting: Neurology

## 2016-10-23 ENCOUNTER — Ambulatory Visit (INDEPENDENT_AMBULATORY_CARE_PROVIDER_SITE_OTHER): Payer: BLUE CROSS/BLUE SHIELD | Admitting: Neurology

## 2016-10-23 VITALS — BP 136/77 | HR 69 | Resp 20 | Ht 61.0 in | Wt 241.0 lb

## 2016-10-23 DIAGNOSIS — G441 Vascular headache, not elsewhere classified: Secondary | ICD-10-CM | POA: Diagnosis not present

## 2016-10-23 DIAGNOSIS — Z720 Tobacco use: Secondary | ICD-10-CM

## 2016-10-23 MED ORDER — CITALOPRAM HYDROBROMIDE 20 MG PO TABS
20.0000 mg | ORAL_TABLET | Freq: Every day | ORAL | 3 refills | Status: DC
Start: 2016-10-23 — End: 2017-11-22

## 2016-10-23 MED ORDER — VERAPAMIL HCL ER 180 MG PO TBCR
180.0000 mg | EXTENDED_RELEASE_TABLET | Freq: Every day | ORAL | 3 refills | Status: DC
Start: 2016-10-23 — End: 2017-11-22

## 2016-10-23 NOTE — Progress Notes (Signed)
[facility]  Provider:  Dr Thula Stewart Referring Provider: [name], MD Primary Care Physician:  [name], MD  Chief Complaint  Patient presents with  . Follow-up    headaches are well    HPI:  Wendy Crawford is a 60 y.o. female here as a referral from Dr. Dennard Schaumann for severe headaches.   I first encountered this patient in October 2012, at the time she complained of a left orbital severe stabbing headache that also radiated to her left for head and temple.  These were sudden onset of sharp electric shock sensations after the main pain resolved,  the residual dysesthesia  was described as " a feeling of sand in her eyes" and   all over the left side of her face and numbness in the face. These woke her out of sleep, clusters of onset .  The patient had been treated in the year preceding my first visit with her for sinusitis she had been on prednisone at [place] pulse medications made the pain somewhat less intense but didn't help along. She also had tearing of the left eye, alongside  a flushed left face and a runny nose  ( left side ) after each attack.  Topiramate was first helpful,  but she states now that she also will awake from  sleep - caused by her headaches. The patient was therefore titrated to verapamil along with 0xygen therapy.02 use prn  in case that her headaches would still appear . She had a very good response and her cluster headaches with SUNCT  have not bothered her throughout the last 18 months. 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 [name] ,has followed the cyst by ultrasound yearly -  She has not seen recent changes.  Today's visit is to refill the patient's medication :she's currently taking ibuprofen and verapamil , and she still takes  20 mg of Celexa a day, She has the oxygen at home but hasn't needed to use it a long time.  Her risk factors are smoking , obesity and menopause . i suspect she has OSA, dry mouth, large neck and BMI obese.   Sleep habits . 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    PLAN :  #1 paroxysmal hemicrania cluster headache responding still well to verapamil. We'll refill her medication at the current level for a year.  #2 the patient is a history of ulcerative colitis she cannot take NSAIDs she told me.  #3 history of sciatica with left foot drop and L4-5 dermatomal pain attenuated left patellar reflex this has recovered the patient is basically not longer in pain ,but still knee pain -" feeling as if left knee is locking -  from the back of my knee"seems to have normal strength and has recovered her left patellar reflex. I encouraged the patient again to stop smoking , and she knows that this is one of her main risk factors for cluster and SUNCT headaches.  She was referred to smoking cessation today.  She denies again any SOB and snoring ,apnea.  Her BMI and neck size 16 inches are indicating high risk for OSA, especially in a smoker.   I will give her a  list of assistive information for her task as well as some educational material about cluster headaches.  History from 09-13-15  Wendy Crawford is here today for a revisit. She was unable to afford the attended sleep study that had ordered - her deductible would have exceeded $3000. We are discussing how to evaluate her likely hypoxemia and hypercapnia otherwise. I think that this is one of the main reasons why she has headaches. Given her body mass index I am very suspicious that she has obstructive sleep apnea, too. She reports that her headaches are not present currently since she take his regular medication this is verapamil and as needed ibuprofen. She remains on Celexa  for depression. Celexa also has an beneficial effect on menopausal symptoms. She has meanwhile returned her oxygen to the DME. She continues to smoke.  I will order an ONO and may send her to pulmonology for oxygen supplementation if the results suggest. I like for her to have a capnography.    10-23-2016 ,  Wendy Crawford is still recovering from her hip replacement surgery, and her husband carries a load of the landscaping business. No pelvic pain.  She is looking well, no return of headaches,  Remaining on verapamil. Even the trigger factor of perfumes or chemical smells has not let to exacerbations of headaches again. And she remains on Celexa which helps her with menopausal sleep symptoms, too. All together, she sleeps well.  I would like her primary care physician to take over these medications, as she seems to be well controlled and stable. Of course, I will be available if she has any return of symptoms.    Review of Systems: Out of a complete 14 system review, the patient complains of only the following symptoms, and all other reviewed systems are negative. obeisty, no headaches of cluster quality, active smoking.  Snoring. Hypersensitivity to parfumes, causing headaches.   Epworth sleepiness score was endorsed at 2 points 1 each for watching television and for sitting and reading fatigue severity is low, geriatric depression score was endorsed at one out of 15 points.  Social History   Social History  . Marital status: Married    Spouse name: [name]  . Number of children: 0  . Years of education: 12   Occupational History  . self employed    Social History Main Topics  . Smoking status: Current Every Day Smoker    Packs/day: 0.50    Years: 42.00  . Smokeless tobacco: Never Used  . Alcohol use No  . Drug use: No  . Sexual activity: Not on file   Other Topics Concern  . Not on file   Social History Narrative   Patient is married [name] and lives at home with her  husband.   Patient is working full-time.   Patient has a high school education.   Patient is right-handed.   Patient does not drinks any caffeine.    Family History  Problem Relation Age of Onset  . Hypertension Mother   . Diabetes Sister   . Diabetes Brother   . Heart disease Maternal Grandmother   . Heart disease Maternal Grandfather   . Colon cancer Neg Hx     Past Medical History:  Diagnosis Date  . Arthritis   . 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 Colitis    ulcerative  . Depression   . Headache(784.0)    most severe for 2 months,cluster  . Headaches, cluster    02  and medication  . Pneumonia   . Primary snoring 06/17/2014  . Psoriasis     Past Surgical History:  Procedure Laterality Date  . BREAST BIOPSY  1990  . TOTAL HIP ARTHROPLASTY Left 06/08/2016   Procedure: LEFT TOTAL HIP ARTHROPLASTY ANTERIOR APPROACH;  Surgeon: Mcarthur Rossetti, MD;  Location: [facility];  Service: Orthopedics;  Laterality: Left;    Current Outpatient Prescriptions  Medication Sig Dispense Refill  . acetaminophen (TYLENOL) 500 MG tablet Take 500 mg by mouth every 6 (six) hours as needed for moderate pain.     . citalopram (CELEXA) 20 MG tablet Take 1 tablet (20 mg total) by mouth daily. 90 tablet 3  . verapamil (CALAN-SR) 180 MG CR tablet TAKE 1 TABLET (180 MG TOTAL) BY MOUTH AT BEDTIME. 90 tablet 3   No current facility-administered medications for this visit.     Allergies as of 10/23/2016 - Review Complete 10/23/2016  Allergen Reaction Noted  . Sulfa antibiotics Itching 06/17/2013  . Sulfonamide derivatives Itching 02/14/2009    Vitals: BP 136/77   Pulse 69   Resp 20   Ht 5' 1"  (1.549 m)   Wt 241 lb (109.3 kg)   BMI 45.54 kg/m  Last Weight:  Wt Readings from Last 1 Encounters:  10/23/16 241 lb (109.3 kg)   Last Height:   Ht Readings from Last 1 Encounters:  10/23/16 5' 1"  (1.549 m)     Physical exam:  General: The patient is  awake, alert and appears not in acute distress. The patient 's skin is sun damaged.  Head: Normocephalic, atraumatic. Neck is supple. Mallampati 4 , neck circumference: 16 , no retrognathia. Cardiovascular:  Regular rate and rhythm, without  murmurs or carotid bruit, and without distended neck veins. Respiratory: Lungs are clear to auscultation. Skin:  Without evidence of edema, or rash Trunk: BMI is elevated , patient  has normal posture.  Neurologic exam : The patient is awake and alert, oriented to place and time.   Memory subjective described as intact. There is a normal attention span & concentration ability.  Speech is fluent without dysarthria, dysphonia or aphasia.  Mood and affect are appropriate.  Cranial nerves: Pupils are equal and briskly reactive to light. Funduscopic exam without  evidence of pallor or edema.  Extraocular movements  in vertical and horizontal planes intact and without nystagmus. Visual fields by finger perimetry are intact. Hearing to finger rub intact. Facial sensation intact to fine touch. Facial motor strength is symmetric and tongue and uvula move midline.  Motor exam:  Normal tone and normal muscle bulk and symmetric normal strength in all extremities.  She had lost her left patella reflex -   no longer pain, occ. numbness inner thigh, and no longer give away weakness.   Assessment:  15 minute evauation , further  progression of sciatica , further improvement of headaches.   ONO was performed as she could not afford a PSG.   The overnight pulse oximetry was performed on 09/29/2015, and interpreted on 10-05-15.  My response to the patient was that I would prefer her to have nocturnal oxygen. She only was 2 hours recorded that night the pulse clip may have been lost without her knowledge.  However she spent 39 minutes under or at 88% saturation and 64.9 minutes under or at 89% oxygen saturation. She would qualify for nocturnal oxygen based on this. She is  concerned about the costs.   More than 50% of the face to face time  was dedicated to discussion and coordination of care.  After physical and neurologic examination, review of laboratory studies, imaging, neurophysiology testing and pre-existing records, assessment will be reviewed on the problem list.  #1 paroxysmal hemicrania versus cluster headache responding still well to verapamil. We'll refill her medication at the current level for a year. the patient is a history of ulcerative colitis -she cannot take NSAIDs she told me. Has Tylenol prn.   She denies again any SOB and snoring ,apnea.  Her BMI and neck size 16 inches are indicating high risk for OSA, especially in a smoker.   I encouraged the patient again to stop smoking , and she knows that this is one of her main risk factors for cluster and SUNCT headaches. I recommended to try at least a vapor.   ONO - as she could not afford a PSG. The overnight pulse oximetry was performed on 09/29/2015, and interpreted on 10-05-15. My response to the patient was that I would prefer her to have nocturnal oxygen. She only was 2 hours recorded that night the pulse clip may have been lost without her knowledge. However she spent 39 minutes under or at 88% saturation and 64.9 minutes under or at 89% oxygen saturation.I hope that successful smoking cessation will correct her nocturnal hypoxemia.   Mylie Mccurley, MD   Cc [facility]

## 2017-03-13 ENCOUNTER — Other Ambulatory Visit: Payer: Self-pay | Admitting: Family Medicine

## 2017-03-13 DIAGNOSIS — Z1231 Encounter for screening mammogram for malignant neoplasm of breast: Secondary | ICD-10-CM

## 2017-04-18 ENCOUNTER — Ambulatory Visit
Admission: RE | Admit: 2017-04-18 | Discharge: 2017-04-18 | Disposition: A | Discharge status: Home / Self Care | Payer: BLUE CROSS/BLUE SHIELD | Source: Ambulatory Visit | Attending: Family Medicine | Admitting: Family Medicine

## 2017-04-18 DIAGNOSIS — Z1231 Encounter for screening mammogram for malignant neoplasm of breast: Secondary | ICD-10-CM

## 2017-06-18 ENCOUNTER — Ambulatory Visit (INDEPENDENT_AMBULATORY_CARE_PROVIDER_SITE_OTHER): Payer: Self-pay

## 2017-06-18 ENCOUNTER — Ambulatory Visit (INDEPENDENT_AMBULATORY_CARE_PROVIDER_SITE_OTHER): Payer: BLUE CROSS/BLUE SHIELD | Admitting: Orthopaedic Surgery

## 2017-06-18 DIAGNOSIS — Z96642 Presence of left artificial hip joint: Secondary | ICD-10-CM | POA: Diagnosis not present

## 2017-06-18 DIAGNOSIS — M25561 Pain in right knee: Secondary | ICD-10-CM | POA: Diagnosis not present

## 2017-06-18 DIAGNOSIS — M25562 Pain in left knee: Secondary | ICD-10-CM | POA: Diagnosis not present

## 2017-06-18 NOTE — Progress Notes (Signed)
The patient is 1 year out from a left total hip arthroplasty through direct anterior approach. She said her hip is doing well and has no complains. She does have bilateral knee pain is been going on for a while.  On examination of her left hip she has fluid range of motion of her left hip with no pain at all. Her right hip also moves fluidly. Neither knee has an effusion but do have painful motion.  An AP pelvis lateral of her left operative hip shows a well-seated implant with no, getting features. There is no evidence of loosening. There is no evidence of ostial lysis.  Well on a thorough discussion about her hips and her knees. Certainly losing weight will help quite a bit as well as quad strengthening exercises. If her knees continue to bother her we would see her back in the office and x-ray her knees and consider steroid injections. I did show her quad exercises to strengthen her quads and the like her to try as well. All questions were encouraged and answered. She'll follow up as needed otherwise.

## 2017-07-01 ENCOUNTER — Ambulatory Visit (INDEPENDENT_AMBULATORY_CARE_PROVIDER_SITE_OTHER): Payer: BLUE CROSS/BLUE SHIELD

## 2017-07-01 ENCOUNTER — Ambulatory Visit (INDEPENDENT_AMBULATORY_CARE_PROVIDER_SITE_OTHER): Payer: BLUE CROSS/BLUE SHIELD | Admitting: Orthopaedic Surgery

## 2017-07-01 DIAGNOSIS — G8929 Other chronic pain: Secondary | ICD-10-CM | POA: Insufficient documentation

## 2017-07-01 DIAGNOSIS — M25562 Pain in left knee: Secondary | ICD-10-CM | POA: Diagnosis not present

## 2017-07-01 DIAGNOSIS — M1712 Unilateral primary osteoarthritis, left knee: Secondary | ICD-10-CM | POA: Insufficient documentation

## 2017-07-01 DIAGNOSIS — M25561 Pain in right knee: Secondary | ICD-10-CM

## 2017-07-01 DIAGNOSIS — M1711 Unilateral primary osteoarthritis, right knee: Secondary | ICD-10-CM | POA: Diagnosis not present

## 2017-07-01 MED ORDER — METHYLPREDNISOLONE ACETATE 40 MG/ML IJ SUSP
40.0000 mg | INTRAMUSCULAR | Status: AC | PRN
  Administered 2017-07-01: 40 mg via INTRA_ARTICULAR

## 2017-07-01 MED ORDER — LIDOCAINE HCL 1 % IJ SOLN
3.0000 mL | INTRAMUSCULAR | Status: AC | PRN
  Administered 2017-07-01: 3 mL

## 2017-07-01 NOTE — Progress Notes (Signed)
Office Visit Note   Patient: Wendy Crawford           Date of Birth: Mar 15, 1956           MRN: [id number] Visit Date: 07/01/2017              Requested by: Wendy Frizzle, MD [address] [place], [place] [address] PCP: Wendy Frizzle, MD   Assessment & Plan: Visit Diagnoses:  1. Chronic pain of both knees   2. Unilateral primary osteoarthritis, left knee   3. Unilateral primary osteoarthritis, right knee     Plan: She is already working on quad strengthening exercises and weight loss. I offered her steroid injections in both knees since she is not a diabetic and is leaving on vacation at the end of the week this was appropriate for her. Risks and benefits were described in detail. She is a perfect candidate for trying hyaluronic acid for both knees. We talked about this and we will set her up for bilateral knee hyaluronic acid injections in about a month if this approved.  Follow-Up Instructions: Return in about 4 weeks (around 07/29/2017).   Orders:  Orders Placed This Encounter  Procedures  . Large Joint Injection/Arthrocentesis  . Large Joint Injection/Arthrocentesis  . XR Knee 1-2 Views Left  . XR Knee 1-2 Views Right   No orders of the defined types were placed in this encounter.     Procedures: Large Joint Inj Date/Time: 07/01/2017 8:45 AM Performed by: Wendy Crawford Authorized by: Wendy Crawford Y   Location:  Knee Site:  R knee Ultrasound Guidance: No   Fluoroscopic Guidance: No   Arthrogram: No   Medications:  3 mL lidocaine 1 %; 40 mg methylPREDNISolone acetate 40 MG/ML Large Joint Inj Date/Time: 07/01/2017 8:45 AM Performed by: Wendy Crawford Authorized by: Wendy Crawford   Location:  Knee Site:  L knee Ultrasound Guidance: No   Fluoroscopic Guidance: No   Arthrogram: No   Medications:  3 mL lidocaine 1 %; 40 mg methylPREDNISolone acetate 40 MG/ML     Clinical Data: No additional  findings.   Subjective: No chief complaint on file. The patient comes in today with bilateral knee pain is been going on for years now. It affects detrimentally her activities daily living, her quality of life, her mobility. She gets a lot of knee popping and catching. It hurts mainly on the medial side of both knees. She denies any injuries. This been slowly getting worse for years now. She is not diabetic and has not had any injections in her knees.  HPI  Review of Systems She denies any headache, chest pain, shortness of breath, fever, chills, nausea, vomiting  Objective: Vital Signs: There were no vitals taken for this visit.  Physical Exam She is alert and oriented 3 in no acute distress Ortho Exam Examination of both her knee show significant patellofemoral crepitation. Both knees have varus malalignment with full range of motion. Both knees hurt on the medial joint line of both knees. Her knees are ligamentously stable. Specialty Comments:  No specialty comments available.  Imaging: Xr Knee 1-2 Views Left  Result Date: 07/01/2017 2 views of the left knee AP and lateral show tricompartmental arthritic changes with near bone-on-bone wear on the medial compartment, significant para-articular osteophytes throughout the knee, sclerotic changes and varus malalignment  Xr Knee 1-2 Views Right  Result Date: 07/01/2017 2 views of the right knee show tricompartmental arthritic changes  with varus malalignment. There is near bone-on-bone wear of the medial compartment with sclerotic changes and para-articular osteophytes throughout the knee.    PMFS History: Patient Active Problem List   Diagnosis Date Noted  . Chronic pain of both knees 07/01/2017  . Unilateral primary osteoarthritis, left knee 07/01/2017  . Unilateral primary osteoarthritis, right knee 07/01/2017  . Osteoarthritis of left hip 06/08/2016  . Status post total replacement of left hip 06/08/2016  . Chronic cluster  headache, not intractable 09/13/2015  . COPD exacerbation ([place]) 09/13/2015  . Primary snoring 06/17/2014  . 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 Episodic cluster headache   . RECTAL BLEEDING 12/02/2007  . DIARRHEA 12/02/2007  . ABDOMINAL PAIN RIGHT LOWER QUADRANT 12/02/2007  . COLITIS, ULCERATIVE 06/23/2007   Past Medical History:  Diagnosis Date  . Arthritis   . 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 Colitis    ulcerative  . Depression   . Headache(784.0)    most severe for 2 months,cluster  . Headaches, cluster    02 and medication  . Pneumonia   . Primary snoring 06/17/2014  . Psoriasis     Family History  Problem Relation Age of Onset  . Hypertension Mother   . Diabetes Sister   . Diabetes Brother   . Heart disease Maternal Grandmother   . Heart disease Maternal Grandfather   . Colon cancer Neg Hx     Past Surgical History:  Procedure Laterality Date  . BREAST BIOPSY  1990  . BREAST EXCISIONAL BIOPSY Right 1994  . TOTAL HIP ARTHROPLASTY Left 06/08/2016   Procedure: LEFT TOTAL HIP ARTHROPLASTY ANTERIOR APPROACH;  Surgeon: Wendy Rossetti, MD;  Location: [facility];  Service: Orthopedics;  Laterality: Left;   Social History   Occupational History  . self employed    Social History Main Topics  . Smoking status: Current Every Day Smoker    Packs/day: 0.50    Years: 42.00  . Smokeless tobacco: Never Used  . Alcohol use No  . Drug use: No  . Sexual activity: Not on file

## 2017-07-24 ENCOUNTER — Telehealth (INDEPENDENT_AMBULATORY_CARE_PROVIDER_SITE_OTHER): Payer: Self-pay

## 2017-07-24 NOTE — Telephone Encounter (Signed)
Candy with [facility] needs a signed script faxed to [phone] attn:Candy, case number [id number] [id number].  Cb# is [phone].  Thank You.  Please advise.

## 2017-07-24 NOTE — Telephone Encounter (Signed)
Faxed to provided number  

## 2017-07-31 ENCOUNTER — Ambulatory Visit (INDEPENDENT_AMBULATORY_CARE_PROVIDER_SITE_OTHER): Payer: BLUE CROSS/BLUE SHIELD | Admitting: Orthopaedic Surgery

## 2017-07-31 DIAGNOSIS — M1712 Unilateral primary osteoarthritis, left knee: Secondary | ICD-10-CM | POA: Diagnosis not present

## 2017-07-31 DIAGNOSIS — M1711 Unilateral primary osteoarthritis, right knee: Secondary | ICD-10-CM

## 2017-07-31 MED ORDER — HYLAN G-F 20 48 MG/6ML IX SOSY
48.0000 mg | PREFILLED_SYRINGE | INTRA_ARTICULAR | Status: AC | PRN
  Administered 2017-07-31: 48 mg via INTRA_ARTICULAR

## 2017-07-31 NOTE — Progress Notes (Signed)
   Procedure Note  Patient: Wendy Crawford             Date of Birth: Aug 05, 1956           MRN: [id number]             Visit Date: 07/31/2017  Procedures: Visit Diagnoses: Unilateral primary osteoarthritis, left knee  Unilateral primary osteoarthritis, right knee  Large Joint Inj Date/Time: 07/31/2017 9:05 AM Performed by: Mcarthur Rossetti Authorized by: Mcarthur Rossetti   Location:  Knee Ultrasound Guidance: No   Fluoroscopic Guidance: No   Arthrogram: No   Medications:  48 mg Hylan 48 MG/6ML Large Joint Inj Date/Time: 07/31/2017 9:06 AM Performed by: Mcarthur Rossetti Authorized by: Mcarthur Rossetti   Location:  Knee Site:  R knee Ultrasound Guidance: No   Fluoroscopic Guidance: No   Arthrogram: No     The patient is here for scheduled hyaluronic acid injections with Synvisc 1 in both knees. She had a good response to steroid injections. She has mild arthritis in both knees. She does feel that this is the next appropriate step and I agree with her as well.  On examination of her knees both knees have no effusion. There is slight varus malalignment with good range of motion and x-ray showing note mild to moderate arthritis.  She tolerated both injections well. She'll follow-up as needed knowing that in 3 months from now if she is having pain we can always provide steroid injections again. All questions were encouraged and answered.

## 2017-08-21 ENCOUNTER — Telehealth (INDEPENDENT_AMBULATORY_CARE_PROVIDER_SITE_OTHER): Payer: Self-pay | Admitting: Radiology

## 2017-08-21 NOTE — Telephone Encounter (Signed)
Patient called and said she received phone call and letter from [facility] through [facility] about a prescription.  This is regarding her Synvisc One injections, done in office already, buy and bill. I have told her I will call and cancel these with the SP.  IC and s/w [place], and s/w Evelena Leyden, pharmacist, and this Rx has been cancelled. [phone].

## 2017-10-25 ENCOUNTER — Other Ambulatory Visit: Payer: Self-pay | Admitting: Neurology

## 2017-10-25 DIAGNOSIS — Z720 Tobacco use: Secondary | ICD-10-CM

## 2017-10-25 DIAGNOSIS — G441 Vascular headache, not elsewhere classified: Secondary | ICD-10-CM

## 2017-11-22 ENCOUNTER — Other Ambulatory Visit: Payer: Self-pay | Admitting: Family Medicine

## 2017-11-22 DIAGNOSIS — G441 Vascular headache, not elsewhere classified: Secondary | ICD-10-CM

## 2017-11-22 DIAGNOSIS — Z720 Tobacco use: Secondary | ICD-10-CM

## 2017-11-25 ENCOUNTER — Telehealth: Payer: Self-pay | Admitting: Family Medicine

## 2017-11-25 DIAGNOSIS — Z720 Tobacco use: Secondary | ICD-10-CM

## 2017-11-25 DIAGNOSIS — G441 Vascular headache, not elsewhere classified: Secondary | ICD-10-CM

## 2017-11-25 MED ORDER — DILTIAZEM HCL ER COATED BEADS 180 MG PO CP24: 180.0000 mg | ORAL_CAPSULE | Freq: Every day | ORAL | 1 refills | Status: DC

## 2017-11-25 MED ORDER — VERAPAMIL HCL ER 180 MG PO TBCR: 180.0000 mg | EXTENDED_RELEASE_TABLET | Freq: Every day | ORAL | 3 refills | Status: DC

## 2017-11-25 NOTE — Telephone Encounter (Signed)
Received fax from pharm that verapamil was on back order and per Dr. Dennard Schaumann switch to Diltiazem CD 180 mg. Pharm aware with new rx sent.

## 2017-12-13 ENCOUNTER — Encounter: Payer: Self-pay | Admitting: Family Medicine

## 2017-12-13 ENCOUNTER — Ambulatory Visit: Payer: BLUE CROSS/BLUE SHIELD | Admitting: Family Medicine

## 2017-12-13 VITALS — BP 110/60 | HR 78 | Temp 98.4°F | Resp 20 | Ht 61.0 in | Wt 254.0 lb

## 2017-12-13 DIAGNOSIS — J441 Chronic obstructive pulmonary disease with (acute) exacerbation: Secondary | ICD-10-CM | POA: Diagnosis not present

## 2017-12-13 DIAGNOSIS — J329 Chronic sinusitis, unspecified: Secondary | ICD-10-CM | POA: Diagnosis not present

## 2017-12-13 MED ORDER — PREDNISONE 20 MG PO TABS
ORAL_TABLET | ORAL | 0 refills | Status: DC
Start: 2017-12-13 — End: 2018-10-24

## 2017-12-13 MED ORDER — ALBUTEROL SULFATE HFA 108 (90 BASE) MCG/ACT IN AERS: 2.0000 | INHALATION_SPRAY | Freq: Four times a day (QID) | RESPIRATORY_TRACT | 0 refills | Status: DC | PRN

## 2017-12-13 MED ORDER — LEVOFLOXACIN 500 MG PO TABS: 500.0000 mg | ORAL_TABLET | Freq: Every day | ORAL | 0 refills | Status: DC

## 2017-12-13 NOTE — Progress Notes (Signed)
Subjective:    Patient ID: Wendy Crawford, female    DOB: [birth year], 62 y.o.   MRN: [id number]  HPI Patient is a pleasant 62 year old white female who has been sick since November. She now reports pain and pressure in her left frontal sinus, left maxillary sinus, and in her left ear. She is tender to palpation over the left maxillary sinus. Her left tympanic membrane is erythematous dull with middle ear effusion. She also reports cough and increasing shortness of breath. This is been going on now for several weeks but has recently gotten worse prompting her visit. On examination today, she has diminished breath sounds bilaterally with expiratory wheezing throughout and rhonchorous breath sounds. She continues to smoke. Past Medical History:  Diagnosis Date  . Arthritis   . 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 Colitis    ulcerative  . Depression   . Headache(784.0)    most severe for 2 months,cluster  . Headaches, cluster    02 and medication  . Pneumonia   . Primary snoring 06/17/2014  . Psoriasis    Past Surgical History:  Procedure Laterality Date  . BREAST BIOPSY  1990  . BREAST EXCISIONAL BIOPSY Right 1994  . TOTAL HIP ARTHROPLASTY Left 06/08/2016   Procedure: LEFT TOTAL HIP ARTHROPLASTY ANTERIOR APPROACH;  Surgeon: Mcarthur Rossetti, MD;  Location: [facility];  Service: Orthopedics;  Laterality: Left;   Current Outpatient Medications on File Prior to Visit  Medication Sig Dispense Refill  . acetaminophen (TYLENOL) 500 MG tablet Take 500 mg by mouth every 6 (six) hours as needed for moderate pain.     . citalopram (CELEXA) 20 MG tablet TAKE 1 TABLET (20 MG TOTAL) BY MOUTH DAILY. 90 tablet 3  . diltiazem (CARDIZEM CD) 180 MG 24 hr capsule Take 1 capsule (180 mg total) by mouth daily. 90 capsule 1   No current facility-administered medications on file prior to visit.    Allergies  Allergen Reactions  . Sulfa Antibiotics Itching  .  Sulfonamide Derivatives Itching   Social History   Socioeconomic History  . Marital status: Married    Spouse name: [name]  . Number of children: 0  . Years of education: 57  . Highest education level: Not on file  Social Needs  . Financial resource strain: Not on file  . Food insecurity - worry: Not on file  . Food insecurity - inability: Not on file  . Transportation needs - medical: Not on file  . Transportation needs - non-medical: Not on file  Occupational History  . Occupation: self employed  Tobacco Use  . Smoking status: Current Every Day Smoker    Packs/day: 0.50    Years: 42.00    Pack years: 21.00  . Smokeless tobacco: Never Used  Substance and Sexual Activity  . Alcohol use: No    Alcohol/week: 0.0 oz  . Drug use: No  . Sexual activity: Not on file  Other Topics Concern  . Not on file  Social History Narrative   Patient is married [name] and lives at home with her husband.   Patient is working full-time.   Patient has a high school education.   Patient is right-handed.   Patient does not drinks any caffeine.      Review of Systems  All other systems reviewed and are negative.      Objective:   Physical Exam  Constitutional: She appears well-developed and well-nourished.  HENT:  Right Ear: Tympanic membrane  and ear canal normal.  Left Ear: Tympanic membrane is injected and erythematous.  Nose: Mucosal edema and rhinorrhea present. Left sinus exhibits maxillary sinus tenderness and frontal sinus tenderness.  Mouth/Throat: Oropharynx is clear and moist. No oropharyngeal exudate.  Neck: Neck supple.  Cardiovascular: Normal rate, regular rhythm and normal heart sounds.  Pulmonary/Chest: Effort normal. No respiratory distress. She has wheezes. She has rales.  Lymphadenopathy:    She has no cervical adenopathy.  Vitals reviewed.         Assessment & Plan:  Rhinosinusitis  COPD exacerbation ([place])  Patient likely had a virus and currently  late November that led to a secondary bacterial sinus infection which is then triggered her underlying COPD and is triggering a COPD exacerbation. I explained to the patient that she likely has underlying emphysema due to smoking. The present time his only symptomatic when she sick. I explained that she doesn't quit smoking this is going steadily worsened and become a chronic problem for her. I recommended smoking cessation. In the meantime start prednisone taper pack for the COPD exacerbation as well as Levaquin which would also cover for sinusitis. She will take 500 mg by mouth daily for 7 days. Use albuterol 2 puffs inhaled every 6 hours as needed for wheezing

## 2018-01-01 ENCOUNTER — Ambulatory Visit (INDEPENDENT_AMBULATORY_CARE_PROVIDER_SITE_OTHER): Payer: BLUE CROSS/BLUE SHIELD | Admitting: Physician Assistant

## 2018-01-01 ENCOUNTER — Encounter (INDEPENDENT_AMBULATORY_CARE_PROVIDER_SITE_OTHER): Payer: Self-pay | Admitting: Physician Assistant

## 2018-01-01 DIAGNOSIS — M170 Bilateral primary osteoarthritis of knee: Secondary | ICD-10-CM | POA: Diagnosis not present

## 2018-01-01 MED ORDER — LIDOCAINE HCL 1 % IJ SOLN
3.0000 mL | INTRAMUSCULAR | Status: AC | PRN
  Administered 2018-01-01: 3 mL

## 2018-01-01 MED ORDER — METHYLPREDNISOLONE ACETATE 40 MG/ML IJ SUSP
40.0000 mg | INTRAMUSCULAR | Status: AC | PRN
  Administered 2018-01-01: 40 mg via INTRA_ARTICULAR

## 2018-01-01 NOTE — Progress Notes (Signed)
   Procedure Note HPI: Wendy Crawford has known osteoarthritis both knees.  She comes in today requesting injections in both knees.  She has had hyaluronic injections in both knees in the past.  She states that the last Synvisc 1 injections really did not help.  She is thinking about undergoing knee surgery in the future. Physical exam: Bilateral knees no effusion.  Overall good range of motion with full extension flexion.  Patient: Wendy Crawford             Date of Birth: Apr 28, 1956           MRN: [id number]             Visit Date: 01/01/2018  Procedures: Visit Diagnoses: No diagnosis found.  Large Joint Inj: bilateral knee on 01/01/2018 6:44 PM Indications: pain Details: 22 G 1.5 in needle, anterolateral approach  Arthrogram: No  Medications (Right): 3 mL lidocaine 1 %; 40 mg methylPREDNISolone acetate 40 MG/ML Medications (Left): 3 mL lidocaine 1 %; 40 mg methylPREDNISolone acetate 40 MG/ML Outcome: tolerated well, no immediate complications Procedure, treatment alternatives, risks and benefits explained, specific risks discussed. Consent was given by the patient. Immediately prior to procedure a time out was called to verify the correct patient, procedure, equipment, support staff and site/side marked as required. Patient was prepped and draped in the usual sterile fashion.     Plan: She will follow-up on an as-needed basis.  Continue work on Forensic scientist.

## 2018-03-17 ENCOUNTER — Other Ambulatory Visit: Payer: Self-pay | Admitting: Family Medicine

## 2018-03-17 DIAGNOSIS — Z1231 Encounter for screening mammogram for malignant neoplasm of breast: Secondary | ICD-10-CM

## 2018-03-26 ENCOUNTER — Ambulatory Visit (INDEPENDENT_AMBULATORY_CARE_PROVIDER_SITE_OTHER): Payer: BLUE CROSS/BLUE SHIELD | Admitting: Orthopaedic Surgery

## 2018-03-26 ENCOUNTER — Encounter (INDEPENDENT_AMBULATORY_CARE_PROVIDER_SITE_OTHER): Payer: Self-pay | Admitting: Orthopaedic Surgery

## 2018-03-26 DIAGNOSIS — M170 Bilateral primary osteoarthritis of knee: Secondary | ICD-10-CM

## 2018-03-26 MED ORDER — METHYLPREDNISOLONE ACETATE 40 MG/ML IJ SUSP
40.0000 mg | INTRAMUSCULAR | Status: AC | PRN
  Administered 2018-03-26: 40 mg via INTRA_ARTICULAR

## 2018-03-26 MED ORDER — LIDOCAINE HCL 1 % IJ SOLN
3.0000 mL | INTRAMUSCULAR | Status: AC | PRN
  Administered 2018-03-26: 3 mL

## 2018-03-26 NOTE — Progress Notes (Signed)
   Procedure Note  Patient: Wendy Crawford             Date of Birth: 03-26-1956           MRN: [id number]             Visit Date: 03/26/2018  HPI: Wendy Crawford returns today complaining of both knees bothering her.  She had no new injury to either knee.  She states the injections which were given back in late January only lasted for a month or so.  She is asking for repeat injections.  She has known osteoarthritis of both knees.   Physical exam: Bilateral knees crepitus with passive range of motion.  No effusion abnormal warmth erythema edema.Overall good range of motion of both knees.  Procedures: Visit Diagnoses: Primary osteoarthritis of both knees  Large Joint Inj: bilateral knee on 03/26/2018 10:45 AM Indications: pain Details: 22 G 1.5 in needle, anterolateral approach  Arthrogram: No  Medications (Right): 3 mL lidocaine 1 %; 40 mg methylPREDNISolone acetate 40 MG/ML Medications (Left): 3 mL lidocaine 1 %; 40 mg methylPREDNISolone acetate 40 MG/ML Outcome: tolerated well, no immediate complications Procedure, treatment alternatives, risks and benefits explained, specific risks discussed. Consent was given by the patient. Immediately prior to procedure a time out was called to verify the correct patient, procedure, equipment, support staff and site/side marked as required. Patient was prepped and draped in the usual sterile fashion.     Plan: Discussed with her again quad strengthening exercises.  She will work on this.  She needs to wait a full 3 months before her injections of either knee with cortisone and she understands this.  Questions encouraged and answered.

## 2018-04-21 ENCOUNTER — Ambulatory Visit
Admission: RE | Admit: 2018-04-21 | Discharge: 2018-04-21 | Disposition: A | Discharge status: Home / Self Care | Payer: BLUE CROSS/BLUE SHIELD | Source: Ambulatory Visit | Attending: Family Medicine | Admitting: Family Medicine

## 2018-04-21 ENCOUNTER — Other Ambulatory Visit: Payer: Self-pay | Admitting: Family Medicine

## 2018-04-21 DIAGNOSIS — Z1231 Encounter for screening mammogram for malignant neoplasm of breast: Secondary | ICD-10-CM

## 2018-05-25 ENCOUNTER — Other Ambulatory Visit: Payer: Self-pay | Admitting: Family Medicine

## 2018-06-17 ENCOUNTER — Telehealth (INDEPENDENT_AMBULATORY_CARE_PROVIDER_SITE_OTHER): Payer: Self-pay | Admitting: Radiology

## 2018-06-17 NOTE — Telephone Encounter (Signed)
Patient wants to have more cortisone injections in her knees.  Last injections 03/26/18, so I know she can proceed at this time.  Her question is how long should she wait after cortisone to proceed with TKA?  She is going on vacation first week of August, and was going to call to arrange Left TKA after she was back from vacation.

## 2018-06-17 NOTE — Telephone Encounter (Signed)
Patient advised, and appt for injections made

## 2018-06-17 NOTE — Telephone Encounter (Signed)
I would like to have a patient delay surgery for at least 4 weeks after a steroid injection and a knee or hip.

## 2018-06-25 ENCOUNTER — Ambulatory Visit (INDEPENDENT_AMBULATORY_CARE_PROVIDER_SITE_OTHER): Payer: BLUE CROSS/BLUE SHIELD | Admitting: Orthopaedic Surgery

## 2018-06-25 ENCOUNTER — Encounter (INDEPENDENT_AMBULATORY_CARE_PROVIDER_SITE_OTHER): Payer: Self-pay | Admitting: Orthopaedic Surgery

## 2018-06-25 DIAGNOSIS — M170 Bilateral primary osteoarthritis of knee: Secondary | ICD-10-CM | POA: Diagnosis not present

## 2018-06-25 MED ORDER — METHYLPREDNISOLONE ACETATE 40 MG/ML IJ SUSP
40.0000 mg | INTRAMUSCULAR | Status: AC | PRN
  Administered 2018-06-25: 40 mg via INTRA_ARTICULAR

## 2018-06-25 MED ORDER — LIDOCAINE HCL 1 % IJ SOLN
3.0000 mL | INTRAMUSCULAR | Status: AC | PRN
  Administered 2018-06-25: 3 mL

## 2018-06-25 NOTE — Progress Notes (Signed)
   Procedure Note  Patient: Wendy Crawford             Date of Birth: [birth year]           MRN: [id number]             Visit Date: 06/25/2018  Procedures: Visit Diagnoses: Primary osteoarthritis of both knees  Large Joint Inj: R knee on 06/25/2018 3:25 PM Indications: diagnostic evaluation and pain Details: 22 G 1.5 in needle, superolateral approach  Arthrogram: No  Medications: 3 mL lidocaine 1 %; 40 mg methylPREDNISolone acetate 40 MG/ML Outcome: tolerated well, no immediate complications Procedure, treatment alternatives, risks and benefits explained, specific risks discussed. Consent was given by the patient. Immediately prior to procedure a time out was called to verify the correct patient, procedure, equipment, support staff and site/side marked as required. Patient was prepped and draped in the usual sterile fashion.   Large Joint Inj: L knee on 06/25/2018 3:25 PM Indications: diagnostic evaluation and pain Details: 22 G 1.5 in needle, superolateral approach  Arthrogram: No  Medications: 3 mL lidocaine 1 %; 40 mg methylPREDNISolone acetate 40 MG/ML Outcome: tolerated well, no immediate complications Procedure, treatment alternatives, risks and benefits explained, specific risks discussed. Consent was given by the patient. Immediately prior to procedure a time out was called to verify the correct patient, procedure, equipment, support staff and site/side marked as required. Patient was prepped and draped in the usual sterile fashion.    The patient is well-known to me.  She has a BMI of 48 and moderate arthritis to severe arthritis in both of her knees.  I have actually performed a left total hip arthroplasty on her 2 years ago.  Her knee is a very thin and she has varus malalignment of her knees.  She is heading for a big trip in September would like to have steroids in her knees today with injections.  She is not a diabetic.  She understands fully the risk minutes of these  injections.  She tolerated them well today.  We will see her back in about 3 months to see how she is doing overall.  At some point we will talk about scheduling knee replacement surgery because I would certainly be comfortable with performing knee replacement surgery on her based on her anatomy and her success with a left total hip arthroplasty.

## 2018-09-03 ENCOUNTER — Encounter (INDEPENDENT_AMBULATORY_CARE_PROVIDER_SITE_OTHER): Payer: Self-pay | Admitting: Orthopaedic Surgery

## 2018-09-03 ENCOUNTER — Ambulatory Visit (INDEPENDENT_AMBULATORY_CARE_PROVIDER_SITE_OTHER): Payer: BLUE CROSS/BLUE SHIELD | Admitting: Orthopaedic Surgery

## 2018-09-03 DIAGNOSIS — M1711 Unilateral primary osteoarthritis, right knee: Secondary | ICD-10-CM | POA: Diagnosis not present

## 2018-09-03 DIAGNOSIS — M25562 Pain in left knee: Secondary | ICD-10-CM

## 2018-09-03 DIAGNOSIS — M1712 Unilateral primary osteoarthritis, left knee: Secondary | ICD-10-CM | POA: Diagnosis not present

## 2018-09-03 DIAGNOSIS — M25561 Pain in right knee: Secondary | ICD-10-CM

## 2018-09-03 DIAGNOSIS — G8929 Other chronic pain: Secondary | ICD-10-CM

## 2018-09-03 NOTE — Progress Notes (Signed)
Office Visit Note   Patient: Wendy Crawford           Date of Birth: [birth year]           MRN: [id number] Visit Date: 09/03/2018              Requested by: [name], MD [address] [place], [place] [address] PCP: [name], MD   Assessment & Plan: Visit Diagnoses:  1. Chronic pain of right knee   2. Chronic pain of left knee   3. Unilateral primary osteoarthritis, left knee   4. Unilateral primary osteoarthritis, right knee     Plan: At this point we did talk about the possibility of knee replacement surgery in the near future.  She is amenable to this.  Her biggest concern is her BMI 48.  Her knee is not big at all I do feel that she would benefit from the surgery.  Again in 2017 we did perform a successful total hip arthroplasty that is done well.  We did have a long thorough discussion about the risk of acute blood loss anemia, nerve injury, vessel injury, fracture, DVT, implant failure and infection which all these are heightened given her BMI of 48.  She understands all this fully.  She would like to have surgery scheduled soon.  All questions concerns were answered and addressed.  Follow-Up Instructions: Return for 2 weeks post-op.   Orders:  No orders of the defined types were placed in this encounter.  No orders of the defined types were placed in this encounter.     Procedures: No procedures performed   Clinical Data: No additional findings.   Subjective: Chief Complaint  Patient presents with  . Left Knee - Pain  . Right Knee - Pain  Patient comes in today with continued severe bilateral knee pain with left worse than right.  She has known and well-documented tricompartmental osteoarthritis of both her knees.  She is tried and failed all forms conservative treatment including anti-inflammatories, activity modification, ice, heat, quad strengthening exercises and physical therapy, she is tried steroid injections and hyaluronic acid.  She  is worked on weight loss.  She still has a BMI of 48.  She is now diabetic.  We have performed a successful total hip arthroplasty on her even with that weight.  At this point her pain is daily and it is 10 out of 10.  It is detrimental effect directives daily living, quality of life, mobility.  Is been worsening for over a year now.  She has had a failure of conservative treatment as well for over a year.  HPI  Review of Systems She currently denies any chest pain, shortness of breath, fever, chills, nausea, vomiting, headache  Objective: Vital Signs: There were no vitals taken for this visit.  Physical Exam She is alert and oriented x3 and in no acute distress Ortho Exam Examination both knees show a slight effusion.  Both knees show varus malalignment with patellofemoral crepitation and pain throughout her full arc of motion of both knees.  Both knees ligaments are stable. Specialty Comments:  No specialty comments available.  Imaging: No results found. X-rays of both knees are reviewed again today and show severe tricompartmental arthritic changes.  The left knee seem to be worse than the right.  There is significant medial joint line narrowing to almost no joint space remaining.  There are para-articular osteophytes throughout the knee with varus malalignment.  [place]  History: Patient Active Problem List   Diagnosis Date Noted  . Primary osteoarthritis of both knees 03/26/2018  . Chronic pain of both knees 07/01/2017  . Unilateral primary osteoarthritis, left knee 07/01/2017  . Unilateral primary osteoarthritis, right knee 07/01/2017  . Osteoarthritis of left hip 06/08/2016  . Status post total replacement of left hip 06/08/2016  . Chronic cluster headache, not intractable 09/13/2015  . COPD exacerbation ([place]) 09/13/2015  . Primary snoring 06/17/2014  . 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 Episodic cluster headache   . RECTAL BLEEDING 12/02/2007   . DIARRHEA 12/02/2007  . ABDOMINAL PAIN RIGHT LOWER QUADRANT 12/02/2007  . COLITIS, ULCERATIVE 06/23/2007   Past Medical History:  Diagnosis Date  . Arthritis   . 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 Colitis    ulcerative  . Depression   . Headache(784.0)    most severe for 2 months,cluster  . Headaches, cluster    02 and medication  . Pneumonia   . Primary snoring 06/17/2014  . Psoriasis     Family History  Problem Relation Age of Onset  . Hypertension Mother   . Diabetes Sister   . Diabetes Brother   . Heart disease Maternal Grandmother   . Heart disease Maternal Grandfather   . Colon cancer Neg Hx     Past Surgical History:  Procedure Laterality Date  . BREAST BIOPSY  1990  . BREAST EXCISIONAL BIOPSY Right 1994  . TOTAL HIP ARTHROPLASTY Left 06/08/2016   Procedure: LEFT TOTAL HIP ARTHROPLASTY ANTERIOR APPROACH;  Surgeon: Mcarthur Rossetti, MD;  Location: [facility];  Service: Orthopedics;  Laterality: Left;   Social History   Occupational History  . Occupation: self employed  Tobacco Use  . Smoking status: Current Every Day Smoker    Packs/day: 0.50    Years: 42.00    Pack years: 21.00  . Smokeless tobacco: Never Used  Substance and Sexual Activity  . Alcohol use: No    Alcohol/week: 0.0 standard drinks  . Drug use: No  . Sexual activity: Not on file

## 2018-10-16 ENCOUNTER — Inpatient Hospital Stay (INDEPENDENT_AMBULATORY_CARE_PROVIDER_SITE_OTHER): Payer: BLUE CROSS/BLUE SHIELD | Admitting: Orthopaedic Surgery

## 2018-10-24 NOTE — Progress Notes (Signed)
Please place orders in Epic as patient has a pre-op appointment on 10/29/2018! Thank you!

## 2018-10-27 ENCOUNTER — Other Ambulatory Visit (INDEPENDENT_AMBULATORY_CARE_PROVIDER_SITE_OTHER): Payer: Self-pay

## 2018-10-27 ENCOUNTER — Other Ambulatory Visit (HOSPITAL_COMMUNITY): Payer: Self-pay | Admitting: *Deleted

## 2018-10-27 ENCOUNTER — Other Ambulatory Visit (INDEPENDENT_AMBULATORY_CARE_PROVIDER_SITE_OTHER): Payer: Self-pay | Admitting: Physician Assistant

## 2018-10-27 NOTE — Patient Instructions (Signed)
Wendy Crawford  10/27/2018   Your procedure is scheduled on: 11-07-18  Report to [facility] Main  Entrance  Report to admitting at  715 AM    Call this number if you have problems the morning of surgery [phone]   Remember: Do not eat food or drink liquids :After Midnight. BRUSH YOUR TEETH MORNING OF SURGERY AND RINSE YOUR MOUTH OUT, NO CHEWING GUM CANDY OR MINTS.     Take these medicines the morning of surgery with A SIP OF WATER: CITALOPRAM (CELEXA)                               You may not have any metal on your body including hair pins and              piercings  Do not wear jewelry, make-up, lotions, powders or perfumes, deodorant             Do not wear nail polish.  Do not shave  48 hours prior to surgery.              Do not bring valuables to the hospital. [place].  Contacts, dentures or bridgework may not be worn into surgery.  Leave suitcase in the car. After surgery it may be brought to your room.                  Please read over the following fact sheets you were given: _____________________________________________________________________             [facility] - Preparing for Surgery Before surgery, you can play an important role.  Because skin is not sterile, your skin needs to be as free of germs as possible.  You can reduce the number of germs on your skin by washing with CHG (chlorahexidine gluconate) soap before surgery.  CHG is an antiseptic cleaner which kills germs and bonds with the skin to continue killing germs even after washing. Please DO NOT use if you have an allergy to CHG or antibacterial soaps.  If your skin becomes reddened/irritated stop using the CHG and inform your nurse when you arrive at Short Stay. Do not shave (including legs and underarms) for at least 48 hours prior to the first CHG shower.  You may shave your face/neck. Please follow these instructions  carefully:  1.  Shower with CHG Soap the night before surgery and the  morning of Surgery.  2.  If you choose to wash your hair, wash your hair first as usual with your  normal  shampoo.  3.  After you shampoo, rinse your hair and body thoroughly to remove the  shampoo.                           4.  Use CHG as you would any other liquid soap.  You can apply chg directly  to the skin and wash                       Gently with a scrungie or clean washcloth.  5.  Apply the CHG Soap to your body ONLY FROM THE NECK DOWN.   Do not use on face/  open                           Wound or open sores. Avoid contact with eyes, ears mouth and genitals (private parts).                       Wash face,  Genitals (private parts) with your normal soap.             6.  Wash thoroughly, paying special attention to the area where your surgery  will be performed.  7.  Thoroughly rinse your body with warm water from the neck down.  8.  DO NOT shower/wash with your normal soap after using and rinsing off  the CHG Soap.                9.  Pat yourself dry with a clean towel.            10.  Wear clean pajamas.            11.  Place clean sheets on your bed the night of your first shower and do not  sleep with pets. Day of Surgery : Do not apply any lotions/deodorants the morning of surgery.  Please wear clean clothes to the hospital/surgery center.  FAILURE TO FOLLOW THESE INSTRUCTIONS MAY RESULT IN THE CANCELLATION OF YOUR SURGERY PATIENT SIGNATURE_________________________________  NURSE SIGNATURE__________________________________  ________________________________________________________________________   Wendy Crawford  An incentive spirometer is a tool that can help keep your lungs clear and active. This tool measures how well you are filling your lungs with each breath. Taking long deep breaths may help reverse or decrease the chance of developing breathing (pulmonary) problems (especially infection)  following:  A long period of time when you are unable to move or be active. BEFORE THE PROCEDURE   If the spirometer includes an indicator to show your best effort, your nurse or respiratory therapist will set it to a desired goal.  If possible, sit up straight or lean slightly forward. Try not to slouch.  Hold the incentive spirometer in an upright position. INSTRUCTIONS FOR USE  1. Sit on the edge of your bed if possible, or sit up as far as you can in bed or on a chair. 2. Hold the incentive spirometer in an upright position. 3. Breathe out normally. 4. Place the mouthpiece in your mouth and seal your lips tightly around it. 5. Breathe in slowly and as deeply as possible, raising the piston or the ball toward the top of the column. 6. Hold your breath for 3-5 seconds or for as long as possible. Allow the piston or ball to fall to the bottom of the column. 7. Remove the mouthpiece from your mouth and breathe out normally. 8. Rest for a few seconds and repeat Steps 1 through 7 at least 10 times every 1-2 hours when you are awake. Take your time and take a few normal breaths between deep breaths. 9. The spirometer may include an indicator to show your best effort. Use the indicator as a goal to work toward during each repetition. 10. After each set of 10 deep breaths, practice coughing to be sure your lungs are clear. If you have an incision (the cut made at the time of surgery), support your incision when coughing by placing a pillow or rolled up towels firmly against it. Once you are able to get out of bed, walk around indoors and  cough well. You may stop using the incentive spirometer when instructed by your caregiver.  RISKS AND COMPLICATIONS  Take your time so you do not get dizzy or light-headed.  If you are in pain, you may need to take or ask for pain medication before doing incentive spirometry. It is harder to take a deep breath if you are having pain. AFTER USE  Rest and  breathe slowly and easily.  It can be helpful to keep track of a log of your progress. Your caregiver can provide you with a simple table to help with this. If you are using the spirometer at home, follow these instructions: [name] IF:   You are having difficultly using the spirometer.  You have trouble using the spirometer as often as instructed.  Your pain medication is not giving enough relief while using the spirometer.  You develop fever of 100.5 F (38.1 C) or higher. SEEK IMMEDIATE MEDICAL CARE IF:   You cough up bloody sputum that had not been present before.  You develop fever of 102 F (38.9 C) or greater.  You develop worsening pain at or near the incision site. MAKE SURE YOU:   Understand these instructions.  Will watch your condition.  Will get help right away if you are not doing well or get worse. Document Released: 04/01/2007 Document Revised: 02/11/2012 Document Reviewed: 06/02/2007 [facility] Patient Information 2014 [place], [place].   ________________________________________________________________________

## 2018-10-29 ENCOUNTER — Encounter (HOSPITAL_COMMUNITY): Payer: Self-pay

## 2018-10-29 ENCOUNTER — Other Ambulatory Visit: Payer: Self-pay

## 2018-10-29 ENCOUNTER — Encounter (HOSPITAL_COMMUNITY)
Admission: RE | Admit: 2018-10-29 | Discharge: 2018-10-29 | Disposition: A | Discharge status: Home / Self Care | Payer: BLUE CROSS/BLUE SHIELD | Source: Ambulatory Visit | Attending: Orthopaedic Surgery | Admitting: Orthopaedic Surgery

## 2018-10-29 DIAGNOSIS — Z01812 Encounter for preprocedural laboratory examination: Secondary | ICD-10-CM | POA: Diagnosis not present

## 2018-10-29 LAB — CBC
HCT: 43.5 % (ref 36.0–46.0)
Hemoglobin: 13.9 g/dL (ref 12.0–15.0)
MCH: 29.8 pg (ref 26.0–34.0)
MCHC: 32 g/dL (ref 30.0–36.0)
MCV: 93.3 fL (ref 80.0–100.0)
Platelets: 245 10*3/uL (ref 150–400)
RBC: 4.66 MIL/uL (ref 3.87–5.11)
RDW: 13.2 % (ref 11.5–15.5)
WBC: 5.2 10*3/uL (ref 4.0–10.5)
nRBC: 0 % (ref 0.0–0.2)

## 2018-10-29 LAB — SURGICAL PCR SCREEN
MRSA, PCR: NEGATIVE
Staphylococcus aureus: NEGATIVE

## 2018-10-29 LAB — BASIC METABOLIC PANEL
Anion gap: 8 (ref 5–15)
BUN: 15 mg/dL (ref 8–23)
CO2: 28 mmol/L (ref 22–32)
Calcium: 9.6 mg/dL (ref 8.9–10.3)
Chloride: 103 mmol/L (ref 98–111)
Creatinine, Ser: 0.76 mg/dL (ref 0.44–1.00)
GFR calc Af Amer: >60 mL/min (ref >60)
GFR calc non Af Amer: >60 mL/min (ref >60)
Glucose, Bld: 91 mg/dL (ref 70–99)
Potassium: 4.6 mmol/L (ref 3.5–5.1)
Sodium: 139 mmol/L (ref 135–145)

## 2018-11-07 ENCOUNTER — Other Ambulatory Visit: Payer: Self-pay

## 2018-11-07 ENCOUNTER — Encounter (HOSPITAL_COMMUNITY): Payer: Self-pay | Admitting: *Deleted

## 2018-11-07 ENCOUNTER — Encounter (HOSPITAL_COMMUNITY)
Admission: RE | Disposition: A | Discharge status: Home Health | Payer: Self-pay | Source: Home / Self Care | Attending: Orthopaedic Surgery

## 2018-11-07 ENCOUNTER — Inpatient Hospital Stay (HOSPITAL_COMMUNITY): Payer: BLUE CROSS/BLUE SHIELD

## 2018-11-07 ENCOUNTER — Inpatient Hospital Stay (HOSPITAL_COMMUNITY): Payer: BLUE CROSS/BLUE SHIELD | Admitting: Anesthesiology

## 2018-11-07 ENCOUNTER — Inpatient Hospital Stay (HOSPITAL_COMMUNITY)
Admission: RE | Admit: 2018-11-07 | Discharge: 2018-11-09 | DRG: 470 | Disposition: A | Discharge status: Home Health | Payer: BLUE CROSS/BLUE SHIELD | Attending: Orthopaedic Surgery | Admitting: Orthopaedic Surgery

## 2018-11-07 DIAGNOSIS — M170 Bilateral primary osteoarthritis of knee: Principal | ICD-10-CM | POA: Diagnosis present

## 2018-11-07 DIAGNOSIS — J449 Chronic obstructive pulmonary disease, unspecified: Secondary | ICD-10-CM | POA: Diagnosis present

## 2018-11-07 DIAGNOSIS — M21162 Varus deformity, not elsewhere classified, left knee: Secondary | ICD-10-CM | POA: Diagnosis present

## 2018-11-07 DIAGNOSIS — Z833 Family history of diabetes mellitus: Secondary | ICD-10-CM | POA: Diagnosis not present

## 2018-11-07 DIAGNOSIS — M1712 Unilateral primary osteoarthritis, left knee: Secondary | ICD-10-CM | POA: Diagnosis present

## 2018-11-07 DIAGNOSIS — Z8249 Family history of ischemic heart disease and other diseases of the circulatory system: Secondary | ICD-10-CM | POA: Diagnosis not present

## 2018-11-07 DIAGNOSIS — Z96642 Presence of left artificial hip joint: Secondary | ICD-10-CM | POA: Diagnosis present

## 2018-11-07 DIAGNOSIS — F17210 Nicotine dependence, cigarettes, uncomplicated: Secondary | ICD-10-CM | POA: Diagnosis present

## 2018-11-07 DIAGNOSIS — Z882 Allergy status to sulfonamides status: Secondary | ICD-10-CM

## 2018-11-07 DIAGNOSIS — Z96652 Presence of left artificial knee joint: Secondary | ICD-10-CM

## 2018-11-07 DIAGNOSIS — Z6841 Body Mass Index (BMI) 40.0 and over, adult: Secondary | ICD-10-CM

## 2018-11-07 HISTORY — PX: TOTAL KNEE ARTHROPLASTY: SHX125

## 2018-11-07 SURGERY — ARTHROPLASTY, KNEE, TOTAL
Anesthesia: Spinal | Site: Knee | Laterality: Left

## 2018-11-07 MED ORDER — METOCLOPRAMIDE HCL 5 MG/ML IJ SOLN
5.0000 mg | Freq: Three times a day (TID) | INTRAMUSCULAR | Status: DC | PRN
  Administered 2018-11-08: 10 mg via INTRAVENOUS
  Filled 2018-11-07: qty 2

## 2018-11-07 MED ORDER — LACTATED RINGERS IV SOLN
INTRAVENOUS | Status: DC
  Administered 2018-11-07 (×2): via INTRAVENOUS

## 2018-11-07 MED ORDER — PROPOFOL 10 MG/ML IV BOLUS
INTRAVENOUS | Status: AC
  Filled 2018-11-07: qty 20

## 2018-11-07 MED ORDER — DILTIAZEM HCL ER COATED BEADS 180 MG PO CP24
180.0000 mg | ORAL_CAPSULE | Freq: Every day | ORAL | Status: DC
  Administered 2018-11-07 – 2018-11-08 (×2): 180 mg via ORAL
  Filled 2018-11-07 (×2): qty 1

## 2018-11-07 MED ORDER — PHENYLEPHRINE 40 MCG/ML (10ML) SYRINGE FOR IV PUSH (FOR BLOOD PRESSURE SUPPORT)
PREFILLED_SYRINGE | INTRAVENOUS | Status: AC
  Filled 2018-11-07: qty 10

## 2018-11-07 MED ORDER — DEXAMETHASONE SODIUM PHOSPHATE 10 MG/ML IJ SOLN
INTRAMUSCULAR | Status: DC | PRN
  Administered 2018-11-07: 4 mg via INTRAVENOUS

## 2018-11-07 MED ORDER — STERILE WATER FOR IRRIGATION IR SOLN
Status: DC | PRN
  Administered 2018-11-07: 2000 mL

## 2018-11-07 MED ORDER — METOCLOPRAMIDE HCL 5 MG PO TABS: 5.0000 mg | ORAL_TABLET | Freq: Three times a day (TID) | ORAL | Status: DC | PRN

## 2018-11-07 MED ORDER — FENTANYL CITRATE (PF) 100 MCG/2ML IJ SOLN
50.0000 ug | Freq: Once | INTRAMUSCULAR | Status: AC
  Administered 2018-11-07: 50 ug via INTRAVENOUS
  Filled 2018-11-07: qty 2

## 2018-11-07 MED ORDER — CEFAZOLIN SODIUM-DEXTROSE 1-4 GM/50ML-% IV SOLN
1.0000 g | Freq: Four times a day (QID) | INTRAVENOUS | Status: AC
  Administered 2018-11-07 (×2): 1 g via INTRAVENOUS
  Filled 2018-11-07 (×2): qty 50

## 2018-11-07 MED ORDER — METHOCARBAMOL 500 MG PO TABS
500.0000 mg | ORAL_TABLET | Freq: Four times a day (QID) | ORAL | Status: DC | PRN
  Administered 2018-11-07 – 2018-11-09 (×4): 500 mg via ORAL
  Filled 2018-11-07 (×4): qty 1

## 2018-11-07 MED ORDER — CEFAZOLIN SODIUM-DEXTROSE 2-4 GM/100ML-% IV SOLN
2.0000 g | INTRAVENOUS | Status: AC
  Administered 2018-11-07: 2 g via INTRAVENOUS
  Filled 2018-11-07: qty 100

## 2018-11-07 MED ORDER — SODIUM CHLORIDE 0.9 % IV SOLN
INTRAVENOUS | Status: DC
  Administered 2018-11-07 – 2018-11-09 (×3): via INTRAVENOUS

## 2018-11-07 MED ORDER — PROMETHAZINE HCL 25 MG/ML IJ SOLN: 6.2500 mg | INTRAMUSCULAR | Status: DC | PRN

## 2018-11-07 MED ORDER — TRANEXAMIC ACID-NACL 1000-0.7 MG/100ML-% IV SOLN
1000.0000 mg | INTRAVENOUS | Status: AC
  Administered 2018-11-07: 1000 mg via INTRAVENOUS
  Filled 2018-11-07: qty 100

## 2018-11-07 MED ORDER — CITALOPRAM HYDROBROMIDE 20 MG PO TABS
20.0000 mg | ORAL_TABLET | Freq: Every day | ORAL | Status: DC
  Administered 2018-11-09: 20 mg via ORAL
  Filled 2018-11-07 (×2): qty 1

## 2018-11-07 MED ORDER — SODIUM CHLORIDE 0.9 % IR SOLN
Status: DC | PRN
  Administered 2018-11-07 (×2): 1000 mL

## 2018-11-07 MED ORDER — HYDROMORPHONE HCL 1 MG/ML IJ SOLN
0.5000 mg | INTRAMUSCULAR | Status: DC | PRN
  Administered 2018-11-07 – 2018-11-08 (×3): 1 mg via INTRAVENOUS
  Filled 2018-11-07 (×3): qty 1

## 2018-11-07 MED ORDER — ALUM & MAG HYDROXIDE-SIMETH 200-200-20 MG/5ML PO SUSP: 30.0000 mL | ORAL | Status: DC | PRN

## 2018-11-07 MED ORDER — PHENYLEPHRINE HCL 10 MG/ML IJ SOLN
INTRAMUSCULAR | Status: DC | PRN
  Administered 2018-11-07: 100 ug via INTRAVENOUS

## 2018-11-07 MED ORDER — PANTOPRAZOLE SODIUM 40 MG PO TBEC
40.0000 mg | DELAYED_RELEASE_TABLET | Freq: Every day | ORAL | Status: DC
  Administered 2018-11-07 – 2018-11-09 (×2): 40 mg via ORAL
  Filled 2018-11-07 (×3): qty 1

## 2018-11-07 MED ORDER — BUPIVACAINE IN DEXTROSE 0.75-8.25 % IT SOLN
INTRATHECAL | Status: DC | PRN
  Administered 2018-11-07: 1.8 mL via INTRATHECAL

## 2018-11-07 MED ORDER — PROPOFOL 10 MG/ML IV BOLUS
INTRAVENOUS | Status: AC
  Filled 2018-11-07: qty 40

## 2018-11-07 MED ORDER — DIPHENHYDRAMINE HCL 12.5 MG/5ML PO ELIX: 12.5000 mg | ORAL_SOLUTION | ORAL | Status: DC | PRN

## 2018-11-07 MED ORDER — DOCUSATE SODIUM 100 MG PO CAPS
100.0000 mg | ORAL_CAPSULE | Freq: Two times a day (BID) | ORAL | Status: DC
  Administered 2018-11-07 – 2018-11-09 (×3): 100 mg via ORAL
  Filled 2018-11-07 (×4): qty 1

## 2018-11-07 MED ORDER — HYDROMORPHONE HCL 1 MG/ML IJ SOLN: 0.2500 mg | INTRAMUSCULAR | Status: DC | PRN

## 2018-11-07 MED ORDER — ONDANSETRON HCL 4 MG/2ML IJ SOLN
INTRAMUSCULAR | Status: DC | PRN
  Administered 2018-11-07: 4 mg via INTRAVENOUS

## 2018-11-07 MED ORDER — SUGAMMADEX SODIUM 200 MG/2ML IV SOLN
INTRAVENOUS | Status: AC
  Filled 2018-11-07: qty 2

## 2018-11-07 MED ORDER — MENTHOL 3 MG MT LOZG: 1.0000 | LOZENGE | OROMUCOSAL | Status: DC | PRN

## 2018-11-07 MED ORDER — CLONIDINE HCL (ANALGESIA) 100 MCG/ML EP SOLN
EPIDURAL | Status: DC | PRN
  Administered 2018-11-07: 50 ug

## 2018-11-07 MED ORDER — FENTANYL CITRATE (PF) 100 MCG/2ML IJ SOLN
INTRAMUSCULAR | Status: AC
  Filled 2018-11-07: qty 2

## 2018-11-07 MED ORDER — POLYETHYLENE GLYCOL 3350 17 G PO PACK: 17.0000 g | PACK | Freq: Every day | ORAL | Status: DC | PRN

## 2018-11-07 MED ORDER — ONDANSETRON HCL 4 MG/2ML IJ SOLN
4.0000 mg | Freq: Four times a day (QID) | INTRAMUSCULAR | Status: DC | PRN
  Administered 2018-11-08: 4 mg via INTRAVENOUS
  Filled 2018-11-07: qty 2

## 2018-11-07 MED ORDER — DEXAMETHASONE SODIUM PHOSPHATE 4 MG/ML IJ SOLN: INTRAMUSCULAR | Status: DC | PRN

## 2018-11-07 MED ORDER — OXYCODONE HCL 5 MG PO TABS
10.0000 mg | ORAL_TABLET | ORAL | Status: DC | PRN
  Administered 2018-11-07 (×2): 15 mg via ORAL
  Administered 2018-11-08: 10 mg via ORAL
  Administered 2018-11-08: 15 mg via ORAL
  Filled 2018-11-07 (×4): qty 3
  Filled 2018-11-07 (×2): qty 2
  Filled 2018-11-07: qty 3

## 2018-11-07 MED ORDER — ASPIRIN EC 325 MG PO TBEC
325.0000 mg | DELAYED_RELEASE_TABLET | Freq: Two times a day (BID) | ORAL | Status: DC
  Administered 2018-11-08 – 2018-11-09 (×3): 325 mg via ORAL
  Filled 2018-11-07 (×3): qty 1

## 2018-11-07 MED ORDER — OXYCODONE HCL 5 MG PO TABS
5.0000 mg | ORAL_TABLET | ORAL | Status: DC | PRN
  Administered 2018-11-07: 10 mg via ORAL
  Administered 2018-11-07: 5 mg via ORAL
  Administered 2018-11-08 – 2018-11-09 (×4): 10 mg via ORAL
  Filled 2018-11-07 (×3): qty 2

## 2018-11-07 MED ORDER — PROPOFOL 500 MG/50ML IV EMUL
INTRAVENOUS | Status: DC | PRN
  Administered 2018-11-07: 100 ug/kg/min via INTRAVENOUS

## 2018-11-07 MED ORDER — MIDAZOLAM HCL 2 MG/2ML IJ SOLN
INTRAMUSCULAR | Status: AC
  Filled 2018-11-07: qty 2

## 2018-11-07 MED ORDER — ADULT MULTIVITAMIN W/MINERALS CH
1.0000 | ORAL_TABLET | Freq: Every day | ORAL | Status: DC
  Administered 2018-11-07 – 2018-11-09 (×2): 1 via ORAL
  Filled 2018-11-07 (×3): qty 1

## 2018-11-07 MED ORDER — PHENOL 1.4 % MT LIQD
1.0000 | OROMUCOSAL | Status: DC | PRN
  Filled 2018-11-07: qty 177

## 2018-11-07 MED ORDER — GLYCOPYRROLATE PF 0.2 MG/ML IJ SOSY
PREFILLED_SYRINGE | INTRAMUSCULAR | Status: AC
  Filled 2018-11-07: qty 1

## 2018-11-07 MED ORDER — METHOCARBAMOL 500 MG IVPB - SIMPLE MED
500.0000 mg | Freq: Four times a day (QID) | INTRAVENOUS | Status: DC | PRN
  Filled 2018-11-07: qty 50

## 2018-11-07 MED ORDER — CHLORHEXIDINE GLUCONATE 4 % EX LIQD: 60.0000 mL | Freq: Once | CUTANEOUS | Status: DC

## 2018-11-07 MED ORDER — ROPIVACAINE HCL 7.5 MG/ML IJ SOLN
INTRAMUSCULAR | Status: DC | PRN
  Administered 2018-11-07: 20 mL via PERINEURAL

## 2018-11-07 MED ORDER — MIDAZOLAM HCL 2 MG/2ML IJ SOLN
1.0000 mg | Freq: Once | INTRAMUSCULAR | Status: AC
  Administered 2018-11-07: 2 mg via INTRAVENOUS
  Filled 2018-11-07: qty 2

## 2018-11-07 MED ORDER — KETOROLAC TROMETHAMINE 15 MG/ML IJ SOLN
15.0000 mg | Freq: Four times a day (QID) | INTRAMUSCULAR | Status: DC
  Administered 2018-11-07 – 2018-11-09 (×7): 15 mg via INTRAVENOUS
  Filled 2018-11-07 (×10): qty 1

## 2018-11-07 MED ORDER — ONDANSETRON HCL 4 MG PO TABS: 4.0000 mg | ORAL_TABLET | Freq: Four times a day (QID) | ORAL | Status: DC | PRN

## 2018-11-07 MED ORDER — ACETAMINOPHEN 325 MG PO TABS: 325.0000 mg | ORAL_TABLET | Freq: Four times a day (QID) | ORAL | Status: DC | PRN

## 2018-11-07 MED ORDER — ZOLPIDEM TARTRATE 5 MG PO TABS: 5.0000 mg | ORAL_TABLET | Freq: Every evening | ORAL | Status: DC | PRN

## 2018-11-07 MED ORDER — MULTIVITAMIN ADULT PO CHEW: 2.0000 | CHEWABLE_TABLET | Freq: Every day | ORAL | Status: DC

## 2018-11-07 SURGICAL SUPPLY — 59 items
APL SKNCLS STERI-STRIP NONHPOA (GAUZE/BANDAGES/DRESSINGS)
BAG SPEC THK2 15X12 ZIP CLS (MISCELLANEOUS)
BAG ZIPLOCK 12X15 (MISCELLANEOUS) IMPLANT
BANDAGE ACE 6X5 VEL STRL LF (GAUZE/BANDAGES/DRESSINGS) ×2 IMPLANT
BEARIN INSERT TIBIAL SZ 3 11 (Insert) ×2 IMPLANT
BEARING INSERT TIBIAL SZ 3 11 (Insert) IMPLANT
BENZOIN TINCTURE PRP APPL 2/3 (GAUZE/BANDAGES/DRESSINGS) IMPLANT
BLADE SAG 18X100X1.27 (BLADE) IMPLANT
BLADE SURG SZ10 CARB STEEL (BLADE) ×4 IMPLANT
BNDG CMPR MED 10X6 ELC LF (GAUZE/BANDAGES/DRESSINGS) ×1
BNDG ELASTIC 6X10 VLCR STRL LF (GAUZE/BANDAGES/DRESSINGS) ×1 IMPLANT
BOWL SMART MIX CTS (DISPOSABLE) IMPLANT
BSPLAT TIB 3 KN TRITANIUM (Knees) ×1 IMPLANT
COMP POSTERIOR FEMORAL SZ4 LFT (Femur) ×2 IMPLANT
COMPONENT PSTRER FEMRL SZ4 LT (Femur) IMPLANT
COVER SURGICAL LIGHT HANDLE (MISCELLANEOUS) ×2 IMPLANT
COVER WAND RF STERILE (DRAPES) ×1 IMPLANT
CUFF TOURN SGL QUICK 34 (TOURNIQUET CUFF) ×2
CUFF TRNQT CYL 34X4X40X1 (TOURNIQUET CUFF) ×1 IMPLANT
DECANTER SPIKE VIAL GLASS SM (MISCELLANEOUS) IMPLANT
DRAPE U-SHAPE 47X51 STRL (DRAPES) ×2 IMPLANT
DRSG PAD ABDOMINAL 8X10 ST (GAUZE/BANDAGES/DRESSINGS) ×2 IMPLANT
DURAPREP 26ML APPLICATOR (WOUND CARE) ×2 IMPLANT
ELECT REM PT RETURN 15FT ADLT (MISCELLANEOUS) ×2 IMPLANT
GAUZE SPONGE 4X4 12PLY STRL (GAUZE/BANDAGES/DRESSINGS) ×2 IMPLANT
GAUZE XEROFORM 1X8 LF (GAUZE/BANDAGES/DRESSINGS) IMPLANT
GLOVE BIO SURGEON STRL SZ7.5 (GLOVE) ×2 IMPLANT
GLOVE BIOGEL PI IND STRL 8 (GLOVE) ×2 IMPLANT
GLOVE BIOGEL PI INDICATOR 8 (GLOVE) ×2
GLOVE ECLIPSE 8.0 STRL XLNG CF (GLOVE) ×2 IMPLANT
GOWN STRL REUS W/TWL XL LVL3 (GOWN DISPOSABLE) ×4 IMPLANT
HANDPIECE INTERPULSE COAX TIP (DISPOSABLE) ×2
HOLDER FOLEY CATH W/STRAP (MISCELLANEOUS) IMPLANT
IMMOBILIZER KNEE 20 (SOFTGOODS) ×2
IMMOBILIZER KNEE 20 THIGH 36 (SOFTGOODS) ×1 IMPLANT
KNEE PATELLA ASYMMETRIC 9X29 (Knees) ×1 IMPLANT
KNEE TIBIAL COMPONENT SZ3 (Knees) ×1 IMPLANT
NDL SAFETY ECLIPSE 18X1.5 (NEEDLE) IMPLANT
NEEDLE HYPO 18GX1.5 SHARP (NEEDLE)
NS IRRIG 1000ML POUR BTL (IV SOLUTION) ×2 IMPLANT
PACK TOTAL KNEE CUSTOM (KITS) ×2 IMPLANT
PAD ABD 8X10 STRL (GAUZE/BANDAGES/DRESSINGS) ×1 IMPLANT
PADDING CAST COTTON 6X4 STRL (CAST SUPPLIES) ×3 IMPLANT
PROTECTOR NERVE ULNAR (MISCELLANEOUS) ×2 IMPLANT
SET HNDPC FAN SPRY TIP SCT (DISPOSABLE) ×1 IMPLANT
SET PAD KNEE POSITIONER (MISCELLANEOUS) ×2 IMPLANT
STAPLER VISISTAT 35W (STAPLE) IMPLANT
STRIP CLOSURE SKIN 1/2X4 (GAUZE/BANDAGES/DRESSINGS) ×1 IMPLANT
SUT MNCRL AB 4-0 PS2 18 (SUTURE) IMPLANT
SUT VIC AB 0 CT1 27 (SUTURE) ×2
SUT VIC AB 0 CT1 27XBRD ANTBC (SUTURE) ×1 IMPLANT
SUT VIC AB 1 CT1 36 (SUTURE) ×4 IMPLANT
SUT VIC AB 2-0 CT1 27 (SUTURE) ×4
SUT VIC AB 2-0 CT1 TAPERPNT 27 (SUTURE) ×2 IMPLANT
SYR 3ML LL SCALE MARK (SYRINGE) IMPLANT
TRAY FOLEY MTR SLVR 16FR STAT (SET/KITS/TRAYS/PACK) ×2 IMPLANT
WATER STERILE IRR 1000ML POUR (IV SOLUTION) ×2 IMPLANT
WRAP KNEE MAXI GEL POST OP (GAUZE/BANDAGES/DRESSINGS) ×2 IMPLANT
YANKAUER SUCT BULB TIP 10FT TU (MISCELLANEOUS) ×2 IMPLANT

## 2018-11-07 NOTE — H&P (Signed)
TOTAL KNEE ADMISSION H&P  Patient is being admitted for left total knee arthroplasty.  Subjective:  Chief Complaint:left knee pain.  HPI: Wendy Crawford, 62 y.o. female, has a history of pain and functional disability in the left knee due to arthritis and has failed non-surgical conservative treatments for greater than 12 weeks to includeNSAID's and/or analgesics, corticosteriod injections, viscosupplementation injections, flexibility and strengthening excercises, weight reduction as appropriate and activity modification.  Onset of symptoms was gradual, starting 3 years ago with gradually worsening course since that time. The patient noted no past surgery on the left knee(s).  Patient currently rates pain in the left knee(s) at 10 out of 10 with activity. Patient has night pain, worsening of pain with activity and weight bearing, pain that interferes with activities of daily living, pain with passive range of motion, crepitus and joint swelling.  Patient has evidence of subchondral sclerosis, periarticular osteophytes and joint space narrowing by imaging studies. There is no active infection.  Patient Active Problem List   Diagnosis Date Noted  . Primary osteoarthritis of both knees 03/26/2018  . Chronic pain of both knees 07/01/2017  . Unilateral primary osteoarthritis, left knee 07/01/2017  . Unilateral primary osteoarthritis, right knee 07/01/2017  . Osteoarthritis of left hip 06/08/2016  . Status post total replacement of left hip 06/08/2016  . Chronic cluster headache, not intractable 09/13/2015  . COPD exacerbation ([place]) 09/13/2015  . Primary snoring 06/17/2014  . 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 Episodic cluster headache   . RECTAL BLEEDING 12/02/2007  . DIARRHEA 12/02/2007  . ABDOMINAL PAIN RIGHT LOWER QUADRANT 12/02/2007  . COLITIS, ULCERATIVE 06/23/2007   Past Medical History:  Diagnosis Date  . Arthritis   . 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 Colitis    ulcerative  . Depression   . Headache(784.0)    most severe for 2 months,cluster  . Headaches, cluster    02 and medication  . Pneumonia YRS AGO  . Primary snoring 06/17/2014  . Psoriasis     Past Surgical History:  Procedure Laterality Date  . BREAST BIOPSY Right 1990  . BREAST EXCISIONAL BIOPSY Right 1994  . JOINT REPLACEMENT    . TOTAL HIP ARTHROPLASTY Left 06/08/2016   Procedure: LEFT TOTAL HIP ARTHROPLASTY ANTERIOR APPROACH;  Surgeon: Mcarthur Rossetti, MD;  Location: [facility];  Service: Orthopedics;  Laterality: Left;    No current facility-administered medications for this encounter.    Allergies  Allergen Reactions  . Sulfa Antibiotics Itching  . Sulfonamide Derivatives Itching    Social History   Tobacco Use  . Smoking status: Current Every Day Smoker    Packs/day: 0.50    Years: 42.00    Pack years: 21.00    Types: Cigarettes  . Smokeless tobacco: Never Used  Substance Use Topics  . Alcohol use: No    Alcohol/week: 0.0 standard drinks    Family History  Problem Relation Age of Onset  . Hypertension Mother   . Diabetes Sister   . Diabetes Brother   . Heart disease Maternal Grandmother   . Heart disease Maternal Grandfather   . Colon cancer Neg Hx      Review of Systems  Musculoskeletal: Positive for joint pain.  All other systems reviewed and are negative.   Objective:  Physical Exam  Constitutional: She is oriented to person, place, and time. She appears well-developed and well-nourished.  HENT:  Head: Normocephalic and atraumatic.  Eyes: Pupils  are equal, round, and reactive to light. EOM are normal.  Neck: Normal range of motion.  Cardiovascular: Normal rate and regular rhythm.  Respiratory: Effort normal.  GI: Soft.  Musculoskeletal:       Left knee: She exhibits decreased range of motion, effusion, abnormal alignment, bony tenderness and abnormal meniscus. Tenderness found. Medial  joint line and lateral joint line tenderness noted.  Neurological: She is alert and oriented to person, place, and time.  Skin: Skin is warm and dry.  Psychiatric: She has a normal mood and affect.    Vital signs in last 24 hours:    Labs:   Estimated body mass index is 49.22 kg/m as calculated from the following:   Height as of 10/29/18: 5' (1.524 m).   Weight as of 10/29/18: 114.3 kg.   Imaging Review Plain radiographs demonstrate severe degenerative joint disease of the left knee(s). The overall alignment ismild varus. The bone quality appears to be good for age and reported activity level.   Preoperative templating of the joint replacement has been completed, documented, and submitted to the Operating Room personnel in order to optimize intra-operative equipment management.    Patient's anticipated LOS is less than 2 midnights, meeting these requirements: - Younger than 36 - Lives within 1 hour of care - Has a competent adult at home to recover with post-op recover - NO history of  - Chronic pain requiring opiods  - Diabetes  - Coronary Artery Disease  - Heart failure  - Heart attack  - Stroke  - DVT/VTE  - Cardiac arrhythmia  - Respiratory Failure/COPD  - Renal failure  - Anemia  - Advanced Liver disease        Assessment/Plan:  End stage arthritis, left knee   The patient history, physical examination, clinical judgment of the provider and imaging studies are consistent with end stage degenerative joint disease of the left knee(s) and total knee arthroplasty is deemed medically necessary. The treatment options including medical management, injection therapy arthroscopy and arthroplasty were discussed at length. The risks and benefits of total knee arthroplasty were presented and reviewed. The risks due to aseptic loosening, infection, stiffness, patella tracking problems, thromboembolic complications and other imponderables were discussed. The patient  acknowledged the explanation, agreed to proceed with the plan and consent was signed. Patient is being admitted for inpatient treatment for surgery, pain control, PT, OT, prophylactic antibiotics, VTE prophylaxis, progressive ambulation and ADL's and discharge planning. The patient is planning to be discharged home with home health services

## 2018-11-07 NOTE — Anesthesia Postprocedure Evaluation (Signed)
Anesthesia Post Note  Patient: Wendy Crawford  Procedure(s) Performed: LEFT TOTAL KNEE ARTHROPLASTY (Left Knee)     Patient location during evaluation: PACU Anesthesia Type: Spinal Level of consciousness: oriented and awake and alert Pain management: pain level controlled Vital Signs Assessment: post-procedure vital signs reviewed and stable Respiratory status: spontaneous breathing, respiratory function stable and patient connected to nasal cannula oxygen Cardiovascular status: blood pressure returned to baseline and stable Postop Assessment: no headache, no backache and no apparent nausea or vomiting Anesthetic complications: no    Last Vitals:  Vitals:   11/07/18 1245 11/07/18 1259  BP: (!) 108/58 107/60  Pulse: 68 72  Resp: 12 18  Temp: 36.5 C 36.6 C  SpO2: 100% 98%    Last Pain:  Vitals:   11/07/18 1259  TempSrc: Oral  PainSc: 0-No pain                 Carmisha Larusso S

## 2018-11-07 NOTE — Anesthesia Procedure Notes (Signed)
Spinal  Patient location during procedure: OR Start time: 11/07/2018 9:59 AM End time: 11/07/2018 10:05 AM Staffing Anesthesiologist: Myrtie Soman, MD Performed: anesthesiologist  Preanesthetic Checklist Completed: patient identified, site marked, surgical consent, pre-op evaluation, timeout performed, IV checked, risks and benefits discussed and monitors and equipment checked Spinal Block Patient position: sitting Prep: ChloraPrep Patient monitoring: heart rate, continuous pulse ox and blood pressure Location: L3-4 Injection technique: single-shot Needle Needle type: Sprotte  Needle gauge: 24 G Needle length: 9 cm Additional Notes Expiration date of kit checked and confirmed. Patient tolerated procedure well, without complications.

## 2018-11-07 NOTE — Anesthesia Procedure Notes (Signed)
Anesthesia Procedure Image    

## 2018-11-07 NOTE — Evaluation (Signed)
Physical Therapy Evaluation Patient Details Name: Wendy Crawford MRN: [id number] DOB: 1956-06-19 Today's Date: 11/07/2018   History of Present Illness  62 yo female s/p L TKR on 11/07/18. PMH includes OA, COPD, obesity, UC, depression, pneumonia, L THA 2017.  Clinical Impression  Pt presents with L knee pain, decreased L knee ROM, difficulty performing mobility tasks, and decreased activity tolerance due to pain and syncopal s/s. Pt to benefit from acute PT to address deficits. Pt ambulated 25 ft with RW with min guard assist. Pt educated on quad sets (5-10/hour), ankle pumps (20/hour), and heel slides (5-10/hour) to perform this afternoon/evening to lessen stiffness and increase circulation, to pt's tolerance and limited by pain. PT to progress mobility as tolerated, and will continue to follow acutely.      Follow Up Recommendations Follow surgeon's recommendation for DC plan and follow-up therapies;Supervision for mobility/OOB(HHPT )    Equipment Recommendations  None recommended by PT    Recommendations for Other Services       Precautions / Restrictions Precautions Precautions: Fall Restrictions Weight Bearing Restrictions: No Other Position/Activity Restrictions: WBAT       Mobility  Bed Mobility Overal bed mobility: Needs Assistance Bed Mobility: Supine to Sit     Supine to sit: Min assist;HOB elevated     General bed mobility comments: Min assist for LLE management. Verbal cuing for sequencing to EOB, increased time/effort. Pt with mild dizziness upon sitting EOB, BP and HR 114/78 and 78 bpm. Pt motivated to ambulate.  Transfers Overall transfer level: Needs assistance Equipment used: Rolling walker (2 wheeled) Transfers: Sit to/from Stand Sit to Stand: Min guard;From elevated surface         General transfer comment: Min guard for safety. Verbal cuing for hand placement, pt with weight-shifting onto LLE without buckling/instability noted.    Ambulation/Gait Ambulation/Gait assistance: Min guard;+2 safety/equipment(chair follow) Gait Distance (Feet): 25 Feet Assistive device: Rolling walker (2 wheeled) Gait Pattern/deviations: Step-to pattern;Decreased weight shift to left;Antalgic Gait velocity: decr    General Gait Details: Min guard for safety, close L knee guarding initially but let up on this due to lack of instability/buckling. Verbal cuing for sequencing, turning. Pt with good placement in RW. At 25 ft ambulation, pt reporting wooziness and feeling hot, PT requested sitting in chair follow (pt's sister following with chair)   Stairs            Wheelchair Mobility    Modified Rankin (Stroke Patients Only)       Balance Overall balance assessment: Mild deficits observed, not formally tested                                           Pertinent Vitals/Pain Pain Assessment: 0-10 Pain Score: 4  Pain Location: L knee Pain Descriptors / Indicators: Sore;Aching Pain Intervention(s): Limited activity within patient's tolerance;Monitored during session;Repositioned;Ice applied;Premedicated before session    [place] expects to be discharged to:: Private residence Living Arrangements: Spouse/significant other Available Help at Discharge: Family;Available 24 hours/day;Friend(s) Type of Home: Mobile home Home Access: Ramped entrance;Stairs to enter Entrance Stairs-Rails: None Entrance Stairs-Number of Steps: 1 Home Layout: One level Home Equipment: Walker - 2 wheels;Cane - single point;Bedside commode;Tub bench      Prior Function Level of Independence: Independent with assistive device(s)         Comments: Uses cane for mobility. Pt helps mother with  dementia     Hand Dominance   Dominant Hand: Right    Extremity/Trunk Assessment   Upper Extremity Assessment Upper Extremity Assessment: Overall WFL for tasks assessed    Lower Extremity Assessment Lower  Extremity Assessment: Overall WFL for tasks assessed;LLE deficits/detail LLE Deficits / Details: suspected post-surgical weakness; able to perform ankle pumps, quad sets, assisted SLR, and assisted heel slides  LLE Sensation: WNL    Cervical / Trunk Assessment Cervical / Trunk Assessment: Normal  Communication   Communication: No difficulties  Cognition Arousal/Alertness: Awake/alert Behavior During Therapy: WFL for tasks assessed/performed Overall Cognitive Status: Within Functional Limits for tasks assessed                                        General Comments      Exercises     Assessment/Plan    PT Assessment Patient needs continued PT services  PT Problem List Decreased strength;Pain;Decreased range of motion;Decreased activity tolerance;Decreased knowledge of use of DME;Decreased balance;Decreased safety awareness;Decreased mobility       PT Treatment Interventions DME instruction;Therapeutic activities;Gait training;Patient/family education;Therapeutic exercise;Stair training;Balance training;Functional mobility training    PT Goals (Current goals can be found in the Care Plan section)  Acute Rehab PT Goals Patient Stated Goal: none stated  PT Goal Formulation: With patient Time For Goal Achievement: 11/14/18 Potential to Achieve Goals: Good    Frequency 7X/week   Barriers to discharge        Co-evaluation               AM-PAC PT "6 Clicks" Mobility  Outcome Measure Help needed turning from your back to your side while in a flat bed without using bedrails?: A Little Help needed moving from lying on your back to sitting on the side of a flat bed without using bedrails?: A Little Help needed moving to and from a bed to a chair (including a wheelchair)?: A Little Help needed standing up from a chair using your arms (e.g., wheelchair or bedside chair)?: A Little Help needed to walk in hospital room?: A Little Help needed climbing 3-5  steps with a railing? : A Little 6 Click Score: 18    End of Session Equipment Utilized During Treatment: Gait belt Activity Tolerance: Patient tolerated treatment well Patient left: in chair;with chair alarm set;with call bell/phone within reach;with family/visitor present;with SCD's reapplied Nurse Communication: Mobility status PT Visit Diagnosis: Other abnormalities of gait and mobility (R26.89);Difficulty in walking, not elsewhere classified (R26.2)    Time: 1555-1630 PT Time Calculation (min) (ACUTE ONLY): 35 min   Charges:   PT Evaluation $PT Eval Low Complexity: 1 Low PT Treatments $Gait Training: 8-22 mins        Julien Girt, PT Acute Rehabilitation Services Pager [phone]  Office [phone]  Wendy Crawford 11/07/2018, 6:09 PM

## 2018-11-07 NOTE — Anesthesia Preprocedure Evaluation (Signed)
Anesthesia Evaluation  Patient identified by MRN, date of birth, ID band Patient awake    Reviewed: Allergy & Precautions, NPO status , Patient's Chart, lab work & pertinent test results  Airway Mallampati: II  TM Distance: >3 FB Neck ROM: Full    Dental no notable dental hx.    Pulmonary COPD, Current Smoker,    Pulmonary exam normal breath sounds clear to auscultation       Cardiovascular negative cardio ROS Normal cardiovascular exam Rhythm:Regular Rate:Normal     Neuro/Psych negative neurological ROS  negative psych ROS   GI/Hepatic negative GI ROS, Neg liver ROS,   Endo/Other  Morbid obesity  Renal/GU negative Renal ROS  negative genitourinary   Musculoskeletal  (+) Arthritis , Osteoarthritis,    Abdominal   Peds negative pediatric ROS (+)  Hematology negative hematology ROS (+)   Anesthesia Other Findings   Reproductive/Obstetrics negative OB ROS                             Anesthesia Physical Anesthesia Plan  ASA: III  Anesthesia Plan: Spinal   Post-op Pain Management:  Regional for Post-op pain   Induction: Intravenous  PONV Risk Score and Plan: 2 and Ondansetron and Dexamethasone  Airway Management Planned: Simple Face Mask  Additional Equipment:   Intra-op Plan:   Post-operative Plan:   Informed Consent: I have reviewed the patients History and Physical, chart, labs and discussed the procedure including the risks, benefits and alternatives for the proposed anesthesia with the patient or authorized representative who has indicated his/her understanding and acceptance.   Dental advisory given  Plan Discussed with: CRNA and Surgeon  Anesthesia Plan Comments:         Anesthesia Quick Evaluation

## 2018-11-07 NOTE — Brief Op Note (Signed)
11/07/2018  11:16 AM  PATIENT:  Wendy Crawford  62 y.o. female  PRE-OPERATIVE DIAGNOSIS:  osteoarthritis left knee  POST-OPERATIVE DIAGNOSIS:  osteoarthritis left knee  PROCEDURE:  Procedure(s): LEFT TOTAL KNEE ARTHROPLASTY (Left)  SURGEON:  Surgeon(s) and Role:    Mcarthur Rossetti, MD - Primary  PHYSICIAN ASSISTANT: Benita Stabile, PA-C  ANESTHESIA:   regional and spinal  EBL:  100 mL   COUNTS:  YES  TOURNIQUET:   Total Tourniquet Time Documented: Thigh (Left) - 43 minutes Total: Thigh (Left) - 43 minutes   DICTATION: .Other Dictation: Dictation Number [id number]  PLAN OF CARE: Admit to inpatient   PATIENT DISPOSITION:  PACU - hemodynamically stable.   Delay start of Pharmacological VTE agent (>24hrs) due to surgical blood loss or risk of bleeding: no

## 2018-11-07 NOTE — Op Note (Signed)
NAME: Wendy Crawford, NEDD MEDICAL RECORD [id number] ACCOUNT [id number] DATE OF BIRTH:[birth year] FACILITY: [facility] LOCATION: [facility] PHYSICIAN:Caylea Foronda Kerry Fort, MD  OPERATIVE REPORT  DATE OF PROCEDURE:  11/07/2018  PREOPERATIVE DIAGNOSIS:  Primary osteoarthritis and degenerative joint disease, left knee.  POSTOPERATIVE DIAGNOSIS:  Primary osteoarthritis and degenerative joint disease, left knee.  PROCEDURE:  Left total knee arthroplasty.  IMPLANTS:  Stryker press-fit Triathlon knee system with size 4 femur, size 3 tibial tray, 11 millimeter fixed bearing polyethylene insert, size 29 press-fit patellar button.  SURGEON:  Lind Guest.  Ninfa Linden, MD  ASSISTANT:  Erskine Emery, PA-C.  ANESTHESIA: 1.  Left lower extremity adductor canal block. 2.  Spinal.  ANTIBIOTICS:  Two grams IV Ancef.  ESTIMATED BLOOD LOSS:  Less  than 100 mL.  TOURNIQUET TIME:  Less than 1 hour.  COMPLICATIONS:  None.  INDICATIONS:  The patient is a very pleasant 62 year old female well known to me.  We actually replaced her left hip 2 years ago.  Her left knee has debilitating painful arthritis.  She has varus malalignment of the knee and she has tried and failed all  forms of conservative treatment.  We have injected her knee on multiple occasions and drained a large fusion.  Her x-rays show varus malalignment and periarticular osteophytes throughout the knee with complete loss of medial joint space.  At this point,  with the failure of conservative treatment measures.  She does wish to proceed with a total knee arthroplasty.  She understands fully the risk of acute blood loss anemia, nerve or vessel injury, fracture, infection, dislocation, DVT and implant failure.   She understands our goals are to decrease pain, improve mobility and overall improve quality of life.  DESCRIPTION OF PROCEDURE:  After informed consent was obtained and appropriate left knee was marked, an adductor canal block was  obtained in the holding room.  She was then brought to the operating room and sat up on the operating table.  Spinal  anesthesia was obtained.  She was then laid in the supine position.  A Foley catheter was placed and a nonsterile tourniquet was placed around the upper left thigh.  Her left thigh, knee, leg, ankle and foot were prepped and draped with DuraPrep and  sterile drapes including a sterile stockinette.  Time-out was called to identify correct patient, correct left knee.  We then used an Esmarch to wrap that leg and tourniquet was inflated to 300 mm of pressure.  We then made a direct midline incision over  the patella and carried this proximally and distally, dissected down the knee joint, carried out a medial parapatellar arthrotomy finding very large joint effusion and severe arthritis throughout the knee with periarticular osteophytes that we removed.   With the knee in a flexed position, we removed remnants of ACL, PCL, medial and lateral meniscus.  We set our extramedullary cutting guide for taking 9 mm off the high side the tibia, corrected for varus and valgus and neutral slope.  We made this cut  without difficulty.  We then used an intramedullary drill for the femur and the notch area for our distal femoral cutting guide.  We set this for a left knee at 5 degrees externally rotated and for an 8 mm distal femoral cut.  We made this cut without  difficulty.  We brought the knee back down to full extension.  Once we cleaned further debris from the knee, we placed a 9 mm extension block and we had achieved full extension.  We went back to the femur and put our femoral sizing guide based off the  epicondylar axis and Whiteside line.  From that we chose a size 4 femur.  We put our 4-in-1 cutting block for size 4 femur.  We made our anterior and posterior cuts followed by our chamfer cuts.  We then made our femoral box cut for a size 4.  We then  went back to the tibia and for the tibia, we  chose a size 3 tibial tray for coverage, setting rotation off the tibial tubercle and the femur.  We chose the size 3 and then did a press-fit keel cut for this.  Of note, in light of her obesity and her bone  was solid and hard and showed no evidence of osteopenia.  We felt that a press-fit system was then adequate and appropriate.  Once we placed our trial tibial tray, a size 3, we placed our size 4 trial femur, and we placed a 9 and 11 mm trial polyethylene  insert and we were pleased with stability with the 11 mm in range of motion.  We then made our patellar cut and drilled 3 holes for a size 29 press-fit patellar button.  We then removed all instrumentation from the knee and irrigated the knee with  normal saline solution using pulsatile lavage.  With the knee in a flexed position, we then placed our real Stryker Triathlon press-fit tibial tray size 3 followed by real size 4 left press-fit femur.  We placed our real 11 mm fixed bearing polyethylene  insert and press-fit our patellar button.  We then brought the knee back down through a range of motion, flexion and extension and it felt stable, rotationally as well.  We then let the tourniquet down.  Hemostasis obtained with electrocautery.  We  closed the arthrotomy with interrupted #1 Vicryl suture followed by 0 Vicryl in the deep tissue, 2-0 Vicryl subcutaneous tissue, 4-0 Monocryl subcuticular stitch and Steri-Strips on the skin.  A well-padded sterile dressing was applied.    She was taken to recovery room in stable condition.    COUNTS:  All final counts were correct.    COMPLICATIONS:  There were no complications noted.    Of note, Benita Stabile, PA-C, assisted the entire case.  Assistance was crucial for facilitating all aspects of this case.  AN/NUANCE  D:11/07/2018 T:11/07/2018 JOB:[id number]

## 2018-11-07 NOTE — Anesthesia Procedure Notes (Signed)
Anesthesia Regional Block: Adductor canal block   Pre-Anesthetic Checklist: ,, timeout performed, Correct Patient, Correct Site, Correct Laterality, Correct Procedure, Correct Position, site marked, Risks and benefits discussed,  Surgical consent,  Pre-op evaluation,  At surgeon's request and post-op pain management  Laterality: Left  Prep: chloraprep       Needles:  Injection technique: Single-shot  Needle Type: Echogenic Needle     Needle Length: 9cm      Additional Needles:   Procedures:,,,, ultrasound used (permanent image in chart),,,,  Narrative:  Start time: 11/07/2018 9:20 AM End time: 11/07/2018 9:29 AM Injection made incrementally with aspirations every 5 mL.  Performed by: Personally  Anesthesiologist: Myrtie Soman, MD  Additional Notes: Patient tolerated the procedure well without complications

## 2018-11-07 NOTE — Transfer of Care (Signed)
Immediate Anesthesia Transfer of Care Note  Patient: Wendy Crawford  Procedure(s) Performed: LEFT TOTAL KNEE ARTHROPLASTY (Left Knee)  Patient Location: PACU  Anesthesia Type:Spinal and MAC combined with regional for post-op pain  Level of Consciousness: awake, alert , oriented and patient cooperative  Airway & Oxygen Therapy: Patient Spontanous Breathing and Patient connected to face mask oxygen  Post-op Assessment: Report given to RN and Post -op Vital signs reviewed and stable  Post vital signs: Reviewed and stable  Last Vitals:  Vitals Value Taken Time  BP    Temp 36.4 C 11/07/2018 11:49 AM  Pulse 71 11/07/2018 11:50 AM  Resp 13 11/07/2018 11:50 AM  SpO2 99 % 11/07/2018 11:50 AM  Vitals shown include unvalidated device data.  Last Pain:  Vitals:   11/07/18 0735  TempSrc: Oral         Complications: No apparent anesthesia complications

## 2018-11-08 LAB — BASIC METABOLIC PANEL
Anion gap: 8 (ref 5–15)
BUN: 18 mg/dL (ref 8–23)
CO2: 23 mmol/L (ref 22–32)
Calcium: 8.7 mg/dL — ABNORMAL LOW (ref 8.9–10.3)
Chloride: 106 mmol/L (ref 98–111)
Creatinine, Ser: 0.71 mg/dL (ref 0.44–1.00)
GFR calc Af Amer: >60 mL/min (ref >60)
GFR calc non Af Amer: >60 mL/min (ref >60)
Glucose, Bld: 137 mg/dL — ABNORMAL HIGH (ref 70–99)
Potassium: 4.2 mmol/L (ref 3.5–5.1)
Sodium: 137 mmol/L (ref 135–145)

## 2018-11-08 LAB — CBC
HCT: 38.7 % (ref 36.0–46.0)
Hemoglobin: 12 g/dL (ref 12.0–15.0)
MCH: 28.8 pg (ref 26.0–34.0)
MCHC: 31 g/dL (ref 30.0–36.0)
MCV: 92.8 fL (ref 80.0–100.0)
Platelets: 231 10*3/uL (ref 150–400)
RBC: 4.17 MIL/uL (ref 3.87–5.11)
RDW: 13 % (ref 11.5–15.5)
WBC: 9.2 10*3/uL (ref 4.0–10.5)
nRBC: 0 % (ref 0.0–0.2)

## 2018-11-08 MED ORDER — ASPIRIN 325 MG PO TBEC: 325.0000 mg | DELAYED_RELEASE_TABLET | Freq: Two times a day (BID) | ORAL | 0 refills | Status: DC

## 2018-11-08 MED ORDER — METHOCARBAMOL 500 MG PO TABS: 500.0000 mg | ORAL_TABLET | Freq: Four times a day (QID) | ORAL | 0 refills | Status: DC | PRN

## 2018-11-08 MED ORDER — OXYCODONE HCL 5 MG PO TABS: 5.0000 mg | ORAL_TABLET | ORAL | 0 refills | Status: DC | PRN

## 2018-11-08 NOTE — Progress Notes (Signed)
Physical Therapy Treatment Patient Details Name: Wendy Crawford MRN: [id number] DOB: 06-12-1956 Today's Date: 11/08/2018    History of Present Illness 62 yo female s/p L TKR on 11/07/18. PMH includes OA, COPD, obesity, UC, depression, pneumonia, L THA 2017.    PT Comments    POD # 1 am session Pt OOB in recliner reported she felt nausea.  Reported to RN and meds given.  Assisted with amb but a limited distance due to active vomiting.  Reported to RN.  Returned to room.    Follow Up Recommendations  Follow surgeon's recommendation for DC plan and follow-up therapies;Supervision for mobility/OOB     Equipment Recommendations  None recommended by PT    Recommendations for Other Services       Precautions / Restrictions Precautions Precautions: Fall Precaution Comments: Instructed on KI use  Restrictions Weight Bearing Restrictions: No Other Position/Activity Restrictions: WBAT     Mobility  Bed Mobility               General bed mobility comments: OOB in recliner   Transfers Overall transfer level: Needs assistance Equipment used: Rolling walker (2 wheeled) Transfers: Sit to/from Stand Sit to Stand: Min guard;From elevated surface         General transfer comment: Min guard for safety. Verbal cuing for hand placement, pt with weight-shifting onto LLE without buckling/instability noted.   Ambulation/Gait Ambulation/Gait assistance: Min guard Gait Distance (Feet): 20 Feet Assistive device: Rolling walker (2 wheeled) Gait Pattern/deviations: Step-to pattern;Decreased weight shift to left;Antalgic Gait velocity: decr    General Gait Details: amb distance limited b7y nausea followed by active vomiting   Stairs             Wheelchair Mobility    Modified Rankin (Stroke Patients Only)       Balance                                            Cognition Arousal/Alertness: Awake/alert Behavior During Therapy: WFL for tasks  assessed/performed Overall Cognitive Status: Within Functional Limits for tasks assessed                                        Exercises      General Comments        Pertinent Vitals/Pain Pain Assessment: 0-10 Pain Score: 3  Pain Location: L knee Pain Descriptors / Indicators: Sore;Aching Pain Intervention(s): Repositioned;Ice applied    Home Living                      Prior Function            PT Goals (current goals can now be found in the care plan section) Progress towards PT goals: Progressing toward goals    Frequency    7X/week      PT Plan Current plan remains appropriate    Co-evaluation              AM-PAC PT "6 Clicks" Mobility   Outcome Measure  Help needed turning from your back to your side while in a flat bed without using bedrails?: A Little Help needed moving from lying on your back to sitting on the side of a flat bed without using bedrails?: A Little Help needed moving  to and from a bed to a chair (including a wheelchair)?: A Little Help needed standing up from a chair using your arms (e.g., wheelchair or bedside chair)?: A Little Help needed to walk in hospital room?: A Little Help needed climbing 3-5 steps with a railing? : A Little 6 Click Score: 18    End of Session Equipment Utilized During Treatment: Gait belt Activity Tolerance: Patient tolerated treatment well Patient left: in chair;with chair alarm set;with call bell/phone within reach;with family/visitor present;with SCD's reapplied Nurse Communication: Mobility status(pt vomited ) PT Visit Diagnosis: Other abnormalities of gait and mobility (R26.89);Difficulty in walking, not elsewhere classified (R26.2)     Time: 7471-8550 PT Time Calculation (min) (ACUTE ONLY): 30 min  Charges:  $Gait Training: 8-22 mins $Therapeutic Activity: 8-22 mins                     Rica Koyanagi  PTA Acute  Rehabilitation Services Pager       [phone] Office      [phone]

## 2018-11-08 NOTE — Evaluation (Signed)
Occupational Therapy Evaluation Patient Details Name: Wendy Crawford MRN: [id number] DOB: 1956/06/06 Today's Date: 11/08/2018    History of Present Illness 62 yo female s/p L TKR on 11/07/18. PMH includes OA, COPD, obesity, UC, depression, pneumonia, L THA 2017.   Clinical Impression   Pt with 7/10 pain throughout session but willing to participate and work with OT. Informed nursing of pain level. Pt practiced functional transfer to 3in1 in bathroom with walker. Began education with AE for LB self care but pt starting to get sleepy so will need to continue with education. Pt would benefit from continued OT to increase independence with self care tasks. Will follow.     Follow Up Recommendations  No OT follow up    Equipment Recommendations  None recommended by OT    Recommendations for Other Services       Precautions / Restrictions Precautions Precautions: Fall Precaution Comments: Instructed on KI use  Required Braces or Orthoses: Knee Immobilizer - Left Restrictions Weight Bearing Restrictions: No Other Position/Activity Restrictions: WBAT       Mobility Bed Mobility               General bed mobility comments: in chair.   Transfers Overall transfer level: Needs assistance Equipment used: Rolling walker (2 wheeled) Transfers: Sit to/from Stand Sit to Stand: Min assist         General transfer comment: slight min assist for safety as pt stood quickly. Cues for hand placement and LE management.     Balance                                           ADL either performed or assessed with clinical judgement   ADL Overall ADL's : Needs assistance/impaired Eating/Feeding: Independent;Sitting   Grooming: Wash/dry hands;Set up;Sitting   Upper Body Bathing: Set up;Sitting   Lower Body Bathing: Moderate assistance;Sit to/from stand   Upper Body Dressing : Set up;Sitting   Lower Body Dressing: Maximal assistance;Sit to/from stand   Toilet  Transfer: Minimal assistance;Ambulation;RW;BSC   Toileting- Water quality scientist and Hygiene: Sit to/from stand;Moderate assistance         General ADL Comments: Pt states she was nauseous earlier and now with 7/10 pain but willing to work with OT. Nursing in room and made aware of pain level. Practiced 3in1 transfer and pt needed min/mod verbal cues for proper hand placement and LE management. Discussed tubbench option and pt does have a tubbench. Also educated on all AE pieces and pt has a long shoe horn and reacher. Pt starting to fall asleep so educated spouse and sisters on uses of AE and if pt interested that OT could practice with AE with her.      Vision Patient Visual Report: No change from baseline       Perception     Praxis      Pertinent Vitals/Pain Pain Assessment: 0-10 Pain Score: 3  Pain Location: L knee Pain Descriptors / Indicators: Sore;Aching Pain Intervention(s): Repositioned;Ice applied     Hand Dominance Right   Extremity/Trunk Assessment Upper Extremity Assessment Upper Extremity Assessment: Overall WFL for tasks assessed           Communication Communication Communication: No difficulties   Cognition Arousal/Alertness: Awake/alert Behavior During Therapy: WFL for tasks assessed/performed Overall Cognitive Status: Within Functional Limits for tasks assessed  General Comments       Exercises     Shoulder Instructions      Home Living Family/patient expects to be discharged to:: Private residence Living Arrangements: Spouse/significant other Available Help at Discharge: Family;Available 24 hours/day;Friend(s) Type of Home: Mobile home Home Access: Ramped entrance;Stairs to enter Entrance Stairs-Number of Steps: 1 Entrance Stairs-Rails: None Home Layout: One level     Bathroom Shower/Tub: Teacher, early years/pre: Handicapped height     Home Equipment: Environmental consultant - 2  wheels;Cane - single point;Bedside commode;Tub bench;Adaptive equipment Adaptive Equipment: Reacher;Long-handled shoe horn        Prior Functioning/Environment Level of Independence: Independent with assistive device(s)        Comments: Uses cane for mobility. Pt helps mother with dementia        OT Problem List: Decreased strength;Decreased activity tolerance;Decreased knowledge of use of DME or AE      OT Treatment/Interventions: Self-care/ADL training;DME and/or AE instruction;Therapeutic activities;Patient/family education    OT Goals(Current goals can be found in the care plan section) Acute Rehab OT Goals Patient Stated Goal: pt agreeable to practice toilet transfer OT Goal Formulation: With patient/family Time For Goal Achievement: 11/15/18 Potential to Achieve Goals: Good  OT Frequency: Min 2X/week   Barriers to D/C:            Co-evaluation              AM-PAC OT "6 Clicks" Daily Activity     Outcome Measure Help from another person eating meals?: None Help from another person taking care of personal grooming?: A Little Help from another person toileting, which includes using toliet, bedpan, or urinal?: A Little Help from another person bathing (including washing, rinsing, drying)?: A Lot Help from another person to put on and taking off regular upper body clothing?: A Little Help from another person to put on and taking off regular lower body clothing?: A Lot 6 Click Score: 17   End of Session Equipment Utilized During Treatment: Rolling walker;Left knee immobilizer;Gait belt  Activity Tolerance: Patient limited by pain Patient left: in chair;with call bell/phone within reach;with family/visitor present  OT Visit Diagnosis: Unsteadiness on feet (R26.81);Muscle weakness (generalized) (M62.81)                Time: 1771-1657 OT Time Calculation (min): 31 min Charges:  OT General Charges $OT Visit: 1 Visit OT Evaluation $OT Eval Low Complexity: 1  Low OT Treatments $Therapeutic Activity: 8-22 mins    Pauline Aus OTR/L Acute Rehabilitation [phone] office number    11/08/2018, 1:19 PM

## 2018-11-08 NOTE — Discharge Instructions (Signed)

## 2018-11-08 NOTE — Progress Notes (Signed)
Subjective: 1 Day Post-Op Procedure(s) (LRB): LEFT TOTAL KNEE ARTHROPLASTY (Left) Patient reports pain as moderate.    Objective: Vital signs in last 24 hours: Temp:  [97.6 F (36.4 C)-98.7 F (37.1 C)] 98.2 F (36.8 C) (12/07 0956) Pulse Rate:  [64-82] 80 (12/07 0956) Resp:  [12-18] 15 (12/07 0956) BP: (104-143)/(58-84) 124/73 (12/07 0956) SpO2:  [93 %-100 %] 93 % (12/07 0956)  Intake/Output from previous day: 12/06 0701 - 12/07 0700 In: 3840 [P.O.:1080; I.V.:2510; IV Piggyback:250] Out: 1475 [Urine:1375; Blood:100] Intake/Output this shift: Total I/O In: 180 [P.O.:180] Out: 100 [Urine:100]  Recent Labs    11/08/18 0350  HGB 12.0   Recent Labs    11/08/18 0350  WBC 9.2  RBC 4.17  HCT 38.7  PLT 231   Recent Labs    11/08/18 0350  NA 137  K 4.2  CL 106  CO2 23  BUN 18  CREATININE 0.71  GLUCOSE 137*  CALCIUM 8.7*   No results for input(s): LABPT, INR in the last 72 hours.  Sensation intact distally Intact pulses distally Dorsiflexion/Plantar flexion intact Incision: dressing C/D/I No cellulitis present Compartment soft  Assessment/Plan: 1 Day Post-Op Procedure(s) (LRB): LEFT TOTAL KNEE ARTHROPLASTY (Left) Up with therapy Plan for discharge tomorrow Discharge home with home health    Wendy Crawford 11/08/2018, 11:04 AM

## 2018-11-08 NOTE — Progress Notes (Signed)
Physical Therapy Treatment Patient Details Name: Wendy Crawford MRN: [id number] DOB: 11-22-1956 Today's Date: 11/08/2018    History of Present Illness 62 yo female s/p L TKR on 11/07/18. PMH includes OA, COPD, obesity, UC, depression, pneumonia, L THA 2017.    PT Comments    POD # 1 pm session Applied KI and instructed pt and sister on use as pt is unable to perform SLR.  Assisted out of recliner to amb to bathroom to void.  Assissted with toilet transfer and instructed on safety.  Assisted with amb a greater distance in hallway however still limited by nausea.  Assisted back to bed and applied ICE.   Pt will need instruction on HEP TE's next session.  No stairs/has a ramp.   Follow Up Recommendations  Follow surgeon's recommendation for DC plan and follow-up therapies;Supervision for mobility/OOB(HH PT)     Equipment Recommendations  None recommended by PT    Recommendations for Other Services       Precautions / Restrictions Precautions Precautions: Fall Precaution Comments: Instructed on KI use  Required Braces or Orthoses: Knee Immobilizer - Left Restrictions Weight Bearing Restrictions: No Other Position/Activity Restrictions: WBAT     Mobility  Bed Mobility Overal bed mobility: Needs Assistance Bed Mobility: Sit to Supine     Supine to sit: Min assist     General bed mobility comments: assisted back to supporting L LE   Transfers Overall transfer level: Needs assistance Equipment used: Rolling walker (2 wheeled) Transfers: Sit to/from [facility] Sit to Stand: Min assist Stand pivot transfers: Min assist       General transfer comment: 25% VC's on safety with turns assisted out of recliner and on/off elevated toilet with VC's to extend L LE prior to sit.    Ambulation/Gait Ambulation/Gait assistance: Min guard Gait Distance (Feet): 45 Feet Assistive device: Rolling walker (2 wheeled) Gait Pattern/deviations: Step-to pattern;Decreased  weight shift to left;Antalgic Gait velocity: decreased    General Gait Details: slow gait with mild c/o nausea assisted from recliner to bathroom, to hallway then back to bed.  Sister present and observered.     Stairs Stairs: (pt has a ramp)           Wheelchair Mobility    Modified Rankin (Stroke Patients Only)       Balance                                            Cognition Arousal/Alertness: Awake/alert Behavior During Therapy: WFL for tasks assessed/performed Overall Cognitive Status: Within Functional Limits for tasks assessed                                 General Comments: feeling nausea all day      Exercises      General Comments        Pertinent Vitals/Pain Pain Assessment: 0-10 Pain Score: 5  Pain Location: L knee Pain Descriptors / Indicators: Sore;Aching;Grimacing;Operative site guarding Pain Intervention(s): Monitored during session;Repositioned;Ice applied    Home Living                      Prior Function            PT Goals (current goals can now be found in the care plan section) Progress towards  PT goals: Progressing toward goals    Frequency    7X/week      PT Plan Current plan remains appropriate    Co-evaluation              AM-PAC PT "6 Clicks" Mobility   Outcome Measure  Help needed turning from your back to your side while in a flat bed without using bedrails?: A Little Help needed moving from lying on your back to sitting on the side of a flat bed without using bedrails?: A Little Help needed moving to and from a bed to a chair (including a wheelchair)?: A Little Help needed standing up from a chair using your arms (e.g., wheelchair or bedside chair)?: A Little Help needed to walk in hospital room?: A Little Help needed climbing 3-5 steps with a railing? : A Lot 6 Click Score: 17    End of Session Equipment Utilized During Treatment: Gait belt Activity  Tolerance: Other (comment)(limited by nausea ) Patient left: in bed;with call bell/phone within reach;with family/visitor present Nurse Communication: Mobility status PT Visit Diagnosis: Other abnormalities of gait and mobility (R26.89);Difficulty in walking, not elsewhere classified (R26.2)     Time: 0539-7673 PT Time Calculation (min) (ACUTE ONLY): 25 min  Charges:  $Gait Training: 8-22 mins $Therapeutic Activity: 8-22 mins                     Rica Koyanagi  PTA Acute  Rehabilitation Services Pager      [phone] Office      [phone]

## 2018-11-09 NOTE — Progress Notes (Signed)
Patient ID: Wendy Crawford, female   DOB: 1956-06-05, 62 y.o.   MRN: [id number] Patient without complaints this morning ready for discharge.  Will discharge after discharge planning completed with caseworker.

## 2018-11-09 NOTE — Progress Notes (Signed)
Discharged from floor via w/c for transport home by car. Belongings & spouse with pt. No changes in assessment. Renne Platts, [employer]

## 2018-11-09 NOTE — Discharge Summary (Signed)
Discharge Diagnoses:  Principal Problem:   Unilateral primary osteoarthritis, left knee Active Problems:   Status post total left knee replacement   Surgeries: Procedure(s): LEFT TOTAL KNEE ARTHROPLASTY on 11/07/2018    Consultants:   Discharged Condition: Improved  Hospital Course: Wendy Crawford is an 62 y.o. female who was admitted 11/07/2018 with a chief complaint of osteoarthritis left knee, with a final diagnosis of osteoarthritis left knee.  Patient was brought to the operating room on 11/07/2018 and underwent Procedure(s): LEFT TOTAL KNEE ARTHROPLASTY.    Patient was given perioperative antibiotics:  Anti-infectives (From admission, onward)   Start     Dose/Rate Route Frequency Ordered Stop   11/07/18 1600  ceFAZolin (ANCEF) IVPB 1 g/50 mL premix     1 g 100 mL/hr over 30 Minutes Intravenous Every 6 hours 11/07/18 1313 11/07/18 2115   11/07/18 0745  ceFAZolin (ANCEF) IVPB 2g/100 mL premix     2 g 200 mL/hr over 30 Minutes Intravenous On call to O.R. 11/07/18 6270 11/07/18 1025    .  Patient was given sequential compression devices, early ambulation, and aspirin for DVT prophylaxis.  Recent vital signs:  Patient Vitals for the past 24 hrs:  BP Temp Temp src Pulse Resp SpO2  11/09/18 0545 131/68 98.9 F (37.2 C) Oral 90 14 100 %  11/08/18 2221 120/74 99.9 F (37.7 C) Oral 96 18 93 %  11/08/18 1343 - - - - - 93 %  11/08/18 1340 115/69 98.2 F (36.8 C) Oral 78 18 91 %  11/08/18 0956 124/73 98.2 F (36.8 C) Oral 80 15 93 %  .  Recent laboratory studies: Dg Knee Left Port  Result Date: 11/07/2018 CLINICAL DATA:  62 year old female with a history of left knee arthroplasty EXAM: PORTABLE LEFT KNEE - 1-2 VIEW COMPARISON:  08/29/2010 FINDINGS: Interval surgical changes of left knee arthroplasty. No acute fracture. Gas within the surgical bed. No radiopaque foreign body. IMPRESSION: Early surgical changes of left knee arthroplasty Electronically Signed   By: Corrie Mckusick  D.O.   On: 11/07/2018 12:16    Discharge Medications:   Allergies as of 11/09/2018      Reactions   Sulfa Antibiotics Itching   Sulfonamide Derivatives Itching      Medication List    TAKE these medications   acetaminophen 500 MG tablet Commonly known as:  TYLENOL Take 1,000 mg by mouth 2 (two) times daily as needed for moderate pain or headache.   aspirin 325 MG EC tablet Take 1 tablet (325 mg total) by mouth 2 (two) times daily after a meal.   citalopram 20 MG tablet Commonly known as:  CELEXA TAKE 1 TABLET (20 MG TOTAL) BY MOUTH DAILY.   diltiazem 180 MG 24 hr capsule Commonly known as:  CARDIZEM CD TAKE 1 CAPSULE BY MOUTH EVERY DAY What changed:    how much to take  when to take this   methocarbamol 500 MG tablet Commonly known as:  ROBAXIN Take 1 tablet (500 mg total) by mouth every 6 (six) hours as needed for muscle spasms.   MULTIVITAMIN ADULT Chew Chew 2 each by mouth daily.   oxyCODONE 5 MG immediate release tablet Commonly known as:  Oxy IR/ROXICODONE Take 1-2 tablets (5-10 mg total) by mouth every 3 (three) hours as needed for moderate pain (pain score 4-6).            Durable Medical Equipment  (From admission, onward)         Start  Ordered   11/07/18 1314  DME 3 n 1  Once     11/07/18 1313   11/07/18 1314  DME Walker rolling  Once    Question:  Patient needs a walker to treat with the following condition  Answer:  Status post total left knee replacement   11/07/18 1313          Diagnostic Studies: Dg Knee Left Port  Result Date: 11/07/2018 CLINICAL DATA:  62 year old female with a history of left knee arthroplasty EXAM: PORTABLE LEFT KNEE - 1-2 VIEW COMPARISON:  08/29/2010 FINDINGS: Interval surgical changes of left knee arthroplasty. No acute fracture. Gas within the surgical bed. No radiopaque foreign body. IMPRESSION: Early surgical changes of left knee arthroplasty Electronically Signed   By: Corrie Mckusick D.O.   On: 11/07/2018  12:16    Patient benefited maximally from their hospital stay and there were no complications.     Disposition: Discharge disposition: 01-Home or Self Care      Discharge Instructions    Call MD / Call 911   Complete by:  As directed    If you experience chest pain or shortness of breath, CALL 911 and be transported to the hospital emergency room.  If you develope a fever above 101 F, pus (white drainage) or increased drainage or redness at the wound, or calf pain, call your surgeon's office.   Constipation Prevention   Complete by:  As directed    Drink plenty of fluids.  Prune juice may be helpful.  You may use a stool softener, such as Colace (over the counter) 100 mg twice a day.  Use MiraLax (over the counter) for constipation as needed.   Diet - low sodium heart healthy   Complete by:  As directed    Increase activity slowly as tolerated   Complete by:  As directed      Follow-up Information    Mcarthur Rossetti, MD Follow up in 2 week(s).   Specialty:  Orthopedic Surgery Contact information: [address] [place] [address] [phone]            Signed: Newt Minion 11/09/2018, 9:08 AM

## 2018-11-09 NOTE — Progress Notes (Signed)
Physical Therapy Treatment Patient Details Name: Wendy Crawford MRN: [id number] DOB: 1956-03-11 Today's Date: 11/09/2018    History of Present Illness 62 yo female s/p L TKR on 11/07/18. PMH includes OA, COPD, obesity, UC, depression, pneumonia, L THA 2017.    PT Comments    Progressing with mobility. Reviewed/practiced exercises and gait training. All education completed. Okay to d/c from PT standpoint-made RN aware.    Follow Up Recommendations  Follow surgeon's recommendation for DC plan and follow-up therapies;Supervision for mobility/OOB     Equipment Recommendations  None recommended by PT    Recommendations for Other Services       Precautions / Restrictions Precautions Precautions: Fall Knee Immobilizer - Left: Discontinue once straight leg raise with < 10 degree lag(able to SLR 12/8) Restrictions Weight Bearing Restrictions: No Other Position/Activity Restrictions: WBAT     Mobility  Bed Mobility               General bed mobility comments: oob in recliner  Transfers Overall transfer level: Needs assistance Equipment used: Rolling walker (2 wheeled) Transfers: Sit to/from Stand Sit to Stand: Supervision         General transfer comment:  for safety.  Ambulation/Gait Ambulation/Gait assistance: Supervision Gait Distance (Feet): 100 Feet Assistive device: Rolling walker (2 wheeled) Gait Pattern/deviations: Step-to pattern     General Gait Details: slow but steady gait speed.    Stairs             Wheelchair Mobility    Modified Rankin (Stroke Patients Only)       Balance                                            Cognition Arousal/Alertness: Awake/alert Behavior During Therapy: WFL for tasks assessed/performed Overall Cognitive Status: Within Functional Limits for tasks assessed                                        Exercises Total Joint Exercises Ankle Circles/Pumps: AROM;Both;10  reps;Seated Quad Sets: AROM;Both;Seated;10 reps Heel Slides: AAROM;Left;10 reps;Seated Hip ABduction/ADduction: AAROM;Left;10 reps;Seated Straight Leg Raises: AROM;AAROM;Left;10 reps;Seated Goniometric ROM: ~5-55 degrees    General Comments        Pertinent Vitals/Pain Pain Assessment: 0-10 Pain Score: 5  Pain Location: L knee Pain Descriptors / Indicators: Sore;Discomfort Pain Intervention(s): Monitored during session;Repositioned;Ice applied    Home Living                      Prior Function            PT Goals (current goals can now be found in the care plan section) Progress towards PT goals: Progressing toward goals    Frequency    7X/week      PT Plan Current plan remains appropriate    Co-evaluation              AM-PAC PT "6 Clicks" Mobility   Outcome Measure  Help needed turning from your back to your side while in a flat bed without using bedrails?: A Little Help needed moving from lying on your back to sitting on the side of a flat bed without using bedrails?: A Little Help needed moving to and from a bed to a chair (including a wheelchair)?: A  Little Help needed standing up from a chair using your arms (e.g., wheelchair or bedside chair)?: A Little Help needed to walk in hospital room?: A Little Help needed climbing 3-5 steps with a railing? : A Little 6 Click Score: 18    End of Session Equipment Utilized During Treatment: Gait belt Activity Tolerance: Patient tolerated treatment well Patient left: in chair;with call bell/phone within reach;with family/visitor present   PT Visit Diagnosis: Other abnormalities of gait and mobility (R26.89);Pain Pain - Right/Left: Left Pain - part of body: Knee     Time: 5277-8242 PT Time Calculation (min) (ACUTE ONLY): 19 min  Charges:  $Gait Training: 8-22 mins                         Weston Anna, [place] Pager: [phone] Office: [phone]

## 2018-11-09 NOTE — Care Management Note (Signed)
Case Management Note  Patient Details  Name: HAELEY FORDHAM MRN: [id number] Date of Birth: March 11, 1956  Subjective/Objective:    S/p L TKR                Action/Plan: NCM spoke to pt and offered choice for HH/CMS list provided/placed on chart. Pt agreeable to [facility]. Pt has RW and 3n1 bedside commode at home.   Expected Discharge Date:  11/09/18               Expected Discharge Plan:  [place]  In-House Referral:  NA  Discharge planning Services  CM Consult  Post Acute Care Choice:  Home Health Choice offered to:  Patient  DME Arranged:  N/A DME Agency:  NA  HH Arranged:  PT [place] Agency:  [facility] (formerly [employer])  Status of Service:  Completed, signed off  If discussed at [employer], dates discussed:    Additional Comments:  Erenest Rasher, RN 11/09/2018, 2:45 PM

## 2018-11-10 ENCOUNTER — Telehealth (INDEPENDENT_AMBULATORY_CARE_PROVIDER_SITE_OTHER): Payer: Self-pay

## 2018-11-10 ENCOUNTER — Encounter (HOSPITAL_COMMUNITY): Payer: Self-pay | Admitting: Orthopaedic Surgery

## 2018-11-10 MED ORDER — ONDANSETRON 4 MG PO TBDP: 4.0000 mg | ORAL_TABLET | Freq: Three times a day (TID) | ORAL | 0 refills | Status: DC | PRN

## 2018-11-10 NOTE — Telephone Encounter (Signed)
I sent in some Zofran.

## 2018-11-10 NOTE — Telephone Encounter (Signed)
Asking for something for nausea

## 2018-11-12 ENCOUNTER — Other Ambulatory Visit: Payer: Self-pay | Admitting: Family Medicine

## 2018-11-13 ENCOUNTER — Telehealth (INDEPENDENT_AMBULATORY_CARE_PROVIDER_SITE_OTHER): Payer: Self-pay | Admitting: Orthopaedic Surgery

## 2018-11-13 NOTE — Telephone Encounter (Signed)
Wendy Crawford with [facility] called to request VO for the following:  1x a week for 2 weeks 1x a week for 3 weeks.  CB#[phone].  Thank you.

## 2018-11-14 NOTE — Telephone Encounter (Signed)
Ok for orders? 

## 2018-11-15 ENCOUNTER — Other Ambulatory Visit (INDEPENDENT_AMBULATORY_CARE_PROVIDER_SITE_OTHER): Payer: Self-pay | Admitting: Orthopaedic Surgery

## 2018-11-15 MED ORDER — OXYCODONE HCL 5 MG PO TABS: 5.0000 mg | ORAL_TABLET | ORAL | 0 refills | Status: DC | PRN

## 2018-11-20 ENCOUNTER — Other Ambulatory Visit (INDEPENDENT_AMBULATORY_CARE_PROVIDER_SITE_OTHER): Payer: Self-pay

## 2018-11-20 ENCOUNTER — Ambulatory Visit (INDEPENDENT_AMBULATORY_CARE_PROVIDER_SITE_OTHER): Payer: BLUE CROSS/BLUE SHIELD | Admitting: Orthopaedic Surgery

## 2018-11-20 ENCOUNTER — Encounter (INDEPENDENT_AMBULATORY_CARE_PROVIDER_SITE_OTHER): Payer: Self-pay | Admitting: Orthopaedic Surgery

## 2018-11-20 DIAGNOSIS — Z96652 Presence of left artificial knee joint: Secondary | ICD-10-CM

## 2018-11-20 MED ORDER — CEPHALEXIN 500 MG PO CAPS: 500.0000 mg | ORAL_CAPSULE | Freq: Four times a day (QID) | ORAL | 0 refills | Status: DC

## 2018-11-20 MED ORDER — OXYCODONE HCL 5 MG PO TABS: 5.0000 mg | ORAL_TABLET | ORAL | 0 refills | Status: DC | PRN

## 2018-11-20 NOTE — Addendum Note (Signed)
Addended by: Jean Rosenthal on: 11/20/2018 01:14 PM   Modules accepted: Orders

## 2018-11-20 NOTE — Progress Notes (Signed)
The patient is 2 weeks tomorrow status post a left total knee arthroplasty.  She is struggling through physical therapy and doing well overall.  She had some blistering around her knee secondary to swelling combined with a side to side suture closure and Steri-Strips.  On exam she has full extension to about 80 degrees flexion.  The knee is swollen.  Her calf is soft.  There is no evidence of infection.  I did place new Steri-Strips.  We will have to transition her as soon as possible to outpatient physical therapy to work on getting her knee bending and moving.  We will continue her pain medications as well.  We will see her back in 4 weeks to see how she is doing overall unless there are issues before then.  She can get her knee wet in the shower.

## 2018-11-24 ENCOUNTER — Encounter: Payer: Self-pay | Admitting: Physical Therapy

## 2018-11-24 ENCOUNTER — Ambulatory Visit: Payer: BLUE CROSS/BLUE SHIELD | Attending: Orthopaedic Surgery | Admitting: Physical Therapy

## 2018-11-24 DIAGNOSIS — M25662 Stiffness of left knee, not elsewhere classified: Secondary | ICD-10-CM | POA: Insufficient documentation

## 2018-11-24 DIAGNOSIS — R262 Difficulty in walking, not elsewhere classified: Secondary | ICD-10-CM | POA: Insufficient documentation

## 2018-11-24 DIAGNOSIS — M25562 Pain in left knee: Secondary | ICD-10-CM | POA: Diagnosis present

## 2018-11-24 DIAGNOSIS — R600 Localized edema: Secondary | ICD-10-CM

## 2018-11-24 NOTE — Patient Instructions (Addendum)
Access Code: [email or web address]  URL: [email or web address]  Date: 11/24/2018  Prepared by: Elsie Ra   Exercises  Supine Quad Set - 10 reps - 2-3 sets - 5 sec hold - 2x daily - 6x weekly  Supine Hamstring Stretch with Strap - 3 sets - 30 hold - 2x daily - 6x weekly  Supine Heel Slide with Strap - 10 reps - 2-3 sets - 5 hold - 2x daily - 6x weekly  Supine Active Straight Leg Raise - 10 reps - 1-3 sets - 2x daily - 6x weekly  Seated Long Arc Quad - 10 reps - 2-3 sets - 3 hold - 2x daily - 6x weekly  Standing Hip Abduction - 10 reps - 2-3 sets - 2x daily - 6x weekly   Photo taken below to document redness and swelling to monitor signs of infection

## 2018-11-24 NOTE — Therapy (Signed)
[place], [place], [address] Phone: [phone]   Fax:  [phone]  Physical Therapy Evaluation  Patient Details  Name: Wendy Crawford MRN: [id number] Date of Birth: Jul 19, 1956 Referring Provider (PT): Jean Rosenthal, MD   Encounter Date: 11/24/2018  PT End of Session - 11/24/18 1612    Visit Number  1    Number of Visits  16    Date for PT Re-Evaluation  01/05/19    Authorization Type  BCBS    PT Start Time  1445    PT Stop Time  1535    PT Time Calculation (min)  50 min    Activity Tolerance  Patient tolerated treatment well    Behavior During Therapy  [facility] for tasks assessed/performed       Past Medical History:  Diagnosis Date  . Arthritis   . 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 Colitis    ulcerative  . Depression   . Headache(784.0)    most severe for 2 months,cluster  . Headaches, cluster    02 and medication  . Pneumonia YRS AGO  . Primary snoring 06/17/2014  . Psoriasis     Past Surgical History:  Procedure Laterality Date  . BREAST BIOPSY Right 1990  . BREAST EXCISIONAL BIOPSY Right 1994  . JOINT REPLACEMENT    . TOTAL HIP ARTHROPLASTY Left 06/08/2016   Procedure: LEFT TOTAL HIP ARTHROPLASTY ANTERIOR APPROACH;  Surgeon: Mcarthur Rossetti, MD;  Location: [facility];  Service: Orthopedics;  Laterality: Left;  . TOTAL KNEE ARTHROPLASTY Left 11/07/2018   Procedure: LEFT TOTAL KNEE ARTHROPLASTY;  Surgeon: Mcarthur Rossetti, MD;  Location: [facility];  Service: Orthopedics;  Laterality: Left;    There were no vitals filed for this visit.   Subjective Assessment - 11/24/18 1512    Subjective  Pt relays she had Lt TKA then HHPT, and now was referred to outpt PT. She said MD gave her some antibiotics due to redness. She was been using only SPC for in the house but uses RW in community    Patient is accompained by:  Family member   sister   Pertinent History  PMH:Lt THA 2017,,OA,headaches,DEP,    Limitations  Sitting;House hold activities;Lifting;Standing;Walking    How long can you stand comfortably?  10 min    How long can you walk comfortably?  15 min    Patient Stated Goals  get my knee bending and back to normal    Currently in Pain?  Yes    Pain Score  3     Pain Location  Knee    Pain Orientation  Left    Pain Descriptors / Indicators  Aching    Pain Type  Surgical pain    Pain Radiating Towards  some numbness down her leg    Pain Onset  1 to 4 weeks ago    Pain Frequency  Intermittent    Aggravating Factors   prolonged standing and walking, bending her knee, sit to stand from toilet    Pain Relieving Factors  resting with ice and elevation    Multiple Pain Sites  No         [facility] PT Assessment - 11/24/18 0001      Assessment   Medical Diagnosis  Lt TKA     Referring Provider (PT)  Jean Rosenthal, MD    Onset Date/Surgical Date  11/07/18    Next MD Visit  12/18/18  Prior Therapy  HHPT      Precautions   Precautions  None      Restrictions   Weight Bearing Restrictions  No      Balance Screen   Has the patient fallen in the past 6 months  No      [place] residence    Additional Comments  ramp to enter, no steps inside, has tub bench, SPC and RW      Prior Function   Level of Independence  Independent with basic ADLs    Vocation  --   not currently working   Leisure  read, takes care of grandchilder      Cognition   Overall Cognitive Status  Within Functional Limits for tasks assessed      Observation/Other Assessments   Focus on Therapeutic Outcomes (FOTO)   74% limited      Sensation   Light Touch  --   slightly decreased Lt lateral knee     ROM / Strength   AROM / PROM / Strength  AROM;PROM;Strength      AROM   AROM Assessment Site  Knee    Right/Left Knee  Left    Left Knee Extension  -5    Left Knee Flexion  86   AAROM with strap  during heelslide     PROM   PROM Assessment Site  Knee    Right/Left Knee  Left    Left Knee Extension  -3    Left Knee Flexion  90      Strength   Overall Strength Comments  Lt hip and knee 4/5 tested in sitting      Flexibility   Soft Tissue Assessment /Muscle Length  --   tight HS and quads     Palpation   Patella mobility  decreased    Palpation comment  TTP anterior knee with increased redness and swelling, mild warmth, she is on antibiotic as precaution, no drainage at incision site      Transfers   Comments  mod I for chair to bed but min A for supine to sit      Ambulation/Gait   Ambulation/Gait  Yes    Ambulation/Gait Assistance  6: Modified independent (Device/Increase time)    Ambulation Distance (Feet)  75 Feet    Assistive device  Rolling walker    Gait Pattern  Decreased step length - left;Decreased hip/knee flexion - left;Decreased dorsiflexion - left    Gait velocity  --   decreased               Objective measurements completed on examination: See above findings.      [place] Adult PT Treatment/Exercise - 11/24/18 0001      Exercises   Exercises  Knee/Hip      Knee/Hip Exercises: Seated   Long Arc Quad  Left;5 sets    Heel Slides  Left;5 sets      Knee/Hip Exercises: Supine   Heel Slides  Left;10 reps    Heel Slides Limitations  5 sec hold AAROM with strap    Straight Leg Raises  Left;5 reps      Modalities   Modalities  Vasopneumatic      Vasopneumatic   Number Minutes Vasopneumatic   10 minutes    Vasopnuematic Location   Knee    Vasopneumatic Pressure  Low    Vasopneumatic Temperature   34      Manual Therapy  Manual therapy comments  PROM, patella mobs             PT Education - 11/24/18 1612    Education Details  HEP,POC. need for short but frequent walks and bending her knee to tolerance    Person(s) Educated  Patient    Methods  Explanation;Demonstration;Verbal cues;Handout    Comprehension  Verbalized  understanding;Need further instruction          PT Long Term Goals - 11/24/18 1628      PT LONG TERM GOAL #1   Title  PT will be I and compliant with HEP.  (6 weeks 01/05/19)    Status  New      PT LONG TERM GOAL #2   Title  Pt will improve knee ROM to 0-115 deg to improve function. (6 weeks 01/05/19)    Status  New      PT LONG TERM GOAL #3   Title  Pt will improve Lt knee strength to 5/5 to improve funciton. (6 weeks 01/05/19)    Status  New      PT LONG TERM GOAL #4   Title  Pt will be able to ambulate community distances without AD and perform ususal activity with less than 2-3/10 pain. (6 weeks 01/05/19)    Status  New      PT LONG TERM GOAL #5   Title  Pt will improve FOTO to less than 49% limited. (6 weeks 01/05/19)    Status  New             Plan - 11/24/18 1616    Clinical Impression Statement  Pt presents Lt knee pain and stiffness S/P Lt TKA 11/07/18. She has increased pain, swelling and redness, and decreased ROM and strength. She has difficulty with prolonged walking, standing, sit to stands, and bending her knee. She will benefit from skilled PT to address her deficits.     History and Personal Factors relevant to plan of care:  PMH:Lt THA 2017,,OA,headaches,DEP,    Clinical Presentation  Evolving    Clinical Presentation due to:  fear avoidance for bending knee, up and down pain levels    Clinical Decision Making  Moderate    Rehab Potential  Good    PT Frequency  Other (comment)   2-3   PT Duration  6 weeks    PT Treatment/Interventions  Cryotherapy;Electrical Stimulation;Iontophoresis 42m/ml Dexamethasone;Moist Heat;Ultrasound;Gait training;Therapeutic activities;Therapeutic exercise;Neuromuscular re-education;Manual techniques;Passive range of motion;Dry needling;Taping;Vasopneumatic Device;Joint Manipulations    PT Next Visit Plan  review HEP, inc knee flexion ROM and begin light strengthening and gait.     PT Home Exercise Plan  quad sets, HS stretch, heel  slides, LAQ, stand hip abd, SLR    Consulted and Agree with Plan of Care  Patient       Patient will benefit from skilled therapeutic intervention in order to improve the following deficits and impairments:  Abnormal gait, Decreased activity tolerance, Decreased endurance, Decreased range of motion, Decreased strength, Hypomobility, Difficulty walking, Impaired flexibility, Increased fascial restricitons, Increased muscle spasms, Pain  Visit Diagnosis: Acute pain of left knee  Stiffness of left knee, not elsewhere classified  Difficulty in walking, not elsewhere classified  Localized edema     Problem List Patient Active Problem List   Diagnosis Date Noted  . Status post total left knee replacement 11/07/2018  . Primary osteoarthritis of both knees 03/26/2018  . Chronic pain of both knees 07/01/2017  . Unilateral primary osteoarthritis, left knee 07/01/2017  .  Unilateral primary osteoarthritis, right knee 07/01/2017  . Osteoarthritis of left hip 06/08/2016  . Status post total replacement of left hip 06/08/2016  . Chronic cluster headache, not intractable 09/13/2015  . COPD exacerbation ([place]) 09/13/2015  . Primary snoring 06/17/2014  . 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 Episodic cluster headache   . RECTAL BLEEDING 12/02/2007  . DIARRHEA 12/02/2007  . ABDOMINAL PAIN RIGHT LOWER QUADRANT 12/02/2007  . COLITIS, ULCERATIVE 06/23/2007    Silvestre Mesi 11/24/2018, 4:31 PM  [facility] [address] [place], [place], [address] Phone: [phone]   Fax:  [phone]  Name: Wendy Crawford MRN: [id number] Date of Birth: 1956-01-03

## 2018-12-01 ENCOUNTER — Telehealth (INDEPENDENT_AMBULATORY_CARE_PROVIDER_SITE_OTHER): Payer: Self-pay | Admitting: Radiology

## 2018-12-01 ENCOUNTER — Other Ambulatory Visit (INDEPENDENT_AMBULATORY_CARE_PROVIDER_SITE_OTHER): Payer: Self-pay | Admitting: Orthopaedic Surgery

## 2018-12-01 MED ORDER — METHOCARBAMOL 500 MG PO TABS: 500.0000 mg | ORAL_TABLET | Freq: Four times a day (QID) | ORAL | 0 refills | Status: DC | PRN

## 2018-12-01 MED ORDER — OXYCODONE HCL 5 MG PO TABS: 5.0000 mg | ORAL_TABLET | ORAL | 0 refills | Status: DC | PRN

## 2018-12-01 NOTE — Telephone Encounter (Signed)
Patient called, asked for refill of muscle relaxers and oxycodone.  She is out of muscle relaxers but has some oxycodone still, just did not want them to run out before getting refill.  She is only taking the Oxycodone  at night, and is taking aleve during the day.  With the holidays she has not been to too many OP PT yet, but has visits scheduled through January now. Please advise.

## 2018-12-01 NOTE — Telephone Encounter (Signed)
i'll send some in

## 2018-12-02 ENCOUNTER — Ambulatory Visit: Payer: BLUE CROSS/BLUE SHIELD | Admitting: Physical Therapy

## 2018-12-02 ENCOUNTER — Encounter: Payer: Self-pay | Admitting: Physical Therapy

## 2018-12-02 DIAGNOSIS — R262 Difficulty in walking, not elsewhere classified: Secondary | ICD-10-CM

## 2018-12-02 DIAGNOSIS — M25562 Pain in left knee: Secondary | ICD-10-CM

## 2018-12-02 DIAGNOSIS — R600 Localized edema: Secondary | ICD-10-CM

## 2018-12-02 DIAGNOSIS — M25662 Stiffness of left knee, not elsewhere classified: Secondary | ICD-10-CM

## 2018-12-02 NOTE — Therapy (Signed)
[place], [place], [address] Phone: [phone]   Fax:  [phone]  Physical Therapy Treatment  Patient Details  Name: Wendy Crawford MRN: [id number] Date of Birth: 11/25/1956 Referring Provider (PT): Jean Rosenthal, MD   Encounter Date: 12/02/2018  PT End of Session - 12/02/18 0940    Visit Number  2    Number of Visits  16    Date for PT Re-Evaluation  01/05/19    Authorization Type  BCBS    PT Start Time  [phone]    PT Stop Time  1017    PT Time Calculation (min)  40 min       Past Medical History:  Diagnosis Date  . Arthritis   . 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 Colitis    ulcerative  . Depression   . Headache(784.0)    most severe for 2 months,cluster  . Headaches, cluster    02 and medication  . Pneumonia YRS AGO  . Primary snoring 06/17/2014  . Psoriasis     Past Surgical History:  Procedure Laterality Date  . BREAST BIOPSY Right 1990  . BREAST EXCISIONAL BIOPSY Right 1994  . JOINT REPLACEMENT    . TOTAL HIP ARTHROPLASTY Left 06/08/2016   Procedure: LEFT TOTAL HIP ARTHROPLASTY ANTERIOR APPROACH;  Surgeon: Mcarthur Rossetti, MD;  Location: [facility];  Service: Orthopedics;  Laterality: Left;  . TOTAL KNEE ARTHROPLASTY Left 11/07/2018   Procedure: LEFT TOTAL KNEE ARTHROPLASTY;  Surgeon: Mcarthur Rossetti, MD;  Location: [facility];  Service: Orthopedics;  Laterality: Left;    There were no vitals filed for this visit.  Subjective Assessment - 12/02/18 0937    Subjective  The knee is stiff in the mornings.     Currently in Pain?  No/denies   just stiff         [facility] PT Assessment - 12/02/18 0001      AROM   Left Knee Flexion  88      PROM   Left Knee Flexion  93      Ambulation/Gait   Ambulation/Gait Assistance  6: Modified independent (Device/Increase time)    Assistive device  Straight cane    Gait velocity  --    decreased                  [facility] Adult PT Treatment/Exercise - 12/02/18 0001      Knee/Hip Exercises: Stretches   Active Hamstring Stretch  2 reps;30 seconds      Knee/Hip Exercises: Aerobic   Nustep  L3 UE/LE x 5 minutes       Knee/Hip Exercises: Standing   Heel Raises  15 reps    Knee Flexion  10 reps    Other Standing Knee Exercises  standing 3 way hip 10 x each bilateral       Knee/Hip Exercises: Seated   Long Arc Quad  20 reps;Weights    Long Arc Quad Weight  3 lbs.    Hamstring Curl  20 reps    Hamstring Limitations  green    Sit to [employer]  10 reps      Knee/Hip Exercises: Supine   Quad Sets  20 reps    Short Arc [employer]  20 reps    Short Arc Quad Sets Limitations  3    Straight Leg Raises  15 reps;Left      Vasopneumatic   Number Minutes Vasopneumatic  10 minutes    Vasopnuematic Location   Knee    Vasopneumatic Pressure  Low    Vasopneumatic Temperature   34                  PT Long Term Goals - 11/24/18 1628      PT LONG TERM GOAL #1   Title  PT will be I and compliant with HEP.  (6 weeks 01/05/19)    Status  New      PT LONG TERM GOAL #2   Title  Pt will improve knee ROM to 0-115 deg to improve function. (6 weeks 01/05/19)    Status  New      PT LONG TERM GOAL #3   Title  Pt will improve Lt knee strength to 5/5 to improve funciton. (6 weeks 01/05/19)    Status  New      PT LONG TERM GOAL #4   Title  Pt will be able to ambulate community distances without AD and perform ususal activity with less than 2-3/10 pain. (6 weeks 01/05/19)    Status  New      PT LONG TERM GOAL #5   Title  Pt will improve FOTO to less than 49% limited. (6 weeks 01/05/19)    Status  New            Plan - 12/02/18 1011    Clinical Impression Statement  ROM improving, She is using SPC safely. She is consistent with HEP. Good tolerance with therex today. Decreased edema and redness since eval.     PT Next Visit Plan  review HEP, inc knee flexion ROM and  begin light strengthening and gait. VAso     PT Home Exercise Plan  quad sets, HS stretch, heel slides, LAQ, stand hip abd, SLR    Consulted and Agree with Plan of Care  Patient       Patient will benefit from skilled therapeutic intervention in order to improve the following deficits and impairments:  Abnormal gait, Decreased activity tolerance, Decreased endurance, Decreased range of motion, Decreased strength, Hypomobility, Difficulty walking, Impaired flexibility, Increased fascial restricitons, Increased muscle spasms, Pain  Visit Diagnosis: Acute pain of left knee  Stiffness of left knee, not elsewhere classified  Difficulty in walking, not elsewhere classified  Localized edema     Problem List Patient Active Problem List   Diagnosis Date Noted  . Status post total left knee replacement 11/07/2018  . Primary osteoarthritis of both knees 03/26/2018  . Chronic pain of both knees 07/01/2017  . Unilateral primary osteoarthritis, left knee 07/01/2017  . Unilateral primary osteoarthritis, right knee 07/01/2017  . Osteoarthritis of left hip 06/08/2016  . Status post total replacement of left hip 06/08/2016  . Chronic cluster headache, not intractable 09/13/2015  . COPD exacerbation ([place]) 09/13/2015  . Primary snoring 06/17/2014  . 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 Episodic cluster headache   . RECTAL BLEEDING 12/02/2007  . DIARRHEA 12/02/2007  . ABDOMINAL PAIN RIGHT LOWER QUADRANT 12/02/2007  . COLITIS, ULCERATIVE 06/23/2007    Dorene Ar, PTA 12/02/2018, 10:12 AM  [place], [place], [address] Phone: [phone]   Fax:  [phone]  Name: Wendy Crawford MRN: [id number] Date of Birth: [birth year]

## 2018-12-05 ENCOUNTER — Encounter

## 2018-12-08 ENCOUNTER — Ambulatory Visit: Payer: BLUE CROSS/BLUE SHIELD | Attending: Orthopaedic Surgery | Admitting: Physical Therapy

## 2018-12-08 DIAGNOSIS — R262 Difficulty in walking, not elsewhere classified: Secondary | ICD-10-CM | POA: Diagnosis present

## 2018-12-08 DIAGNOSIS — M25662 Stiffness of left knee, not elsewhere classified: Secondary | ICD-10-CM | POA: Diagnosis present

## 2018-12-08 DIAGNOSIS — R600 Localized edema: Secondary | ICD-10-CM | POA: Diagnosis present

## 2018-12-08 DIAGNOSIS — M25562 Pain in left knee: Secondary | ICD-10-CM | POA: Diagnosis present

## 2018-12-08 NOTE — Therapy (Signed)
[place], [place], [address] Phone: [phone]   Fax:  [phone]  Physical Therapy Treatment  Patient Details  Name: Wendy Crawford MRN: [id number] Date of Birth: Mar 15, 1956 Referring Provider (PT): Jean Rosenthal, MD   Encounter Date: 12/08/2018  PT End of Session - 12/08/18 1147    Visit Number  4    Number of Visits  16    Date for PT Re-Evaluation  01/05/19    Authorization Type  BCBS    PT Start Time  1100    PT Stop Time  1153    PT Time Calculation (min)  53 min    Activity Tolerance  Patient tolerated treatment well    Behavior During Therapy  [facility] for tasks assessed/performed       Past Medical History:  Diagnosis Date  . Arthritis   . 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 Colitis    ulcerative  . Depression   . Headache(784.0)    most severe for 2 months,cluster  . Headaches, cluster    02 and medication  . Pneumonia YRS AGO  . Primary snoring 06/17/2014  . Psoriasis     Past Surgical History:  Procedure Laterality Date  . BREAST BIOPSY Right 1990  . BREAST EXCISIONAL BIOPSY Right 1994  . JOINT REPLACEMENT    . TOTAL HIP ARTHROPLASTY Left 06/08/2016   Procedure: LEFT TOTAL HIP ARTHROPLASTY ANTERIOR APPROACH;  Surgeon: Mcarthur Rossetti, MD;  Location: [facility];  Service: Orthopedics;  Laterality: Left;  . TOTAL KNEE ARTHROPLASTY Left 11/07/2018   Procedure: LEFT TOTAL KNEE ARTHROPLASTY;  Surgeon: Mcarthur Rossetti, MD;  Location: [facility];  Service: Orthopedics;  Laterality: Left;    There were no vitals filed for this visit.      [facility] PT Assessment - 12/08/18 0001      Assessment   Medical Diagnosis  Lt TKA     Referring Provider (PT)  Jean Rosenthal, MD    Onset Date/Surgical Date  11/07/18    Next MD Visit  12/18/18      AROM   Left Knee Flexion  95      PROM   Left Knee Flexion  100                    [facility] Adult PT Treatment/Exercise - 12/08/18 0001      Ambulation/Gait   Ambulation/Gait Assistance  6: Modified independent (Device/Increase time)    Ambulation Distance (Feet)  120 Feet    Assistive device  Straight cane      Knee/Hip Exercises: Stretches   Active Hamstring Stretch  2 reps;30 seconds    Other Knee/Hip Stretches  heel slides AAROM 5 sec X 15 reps      Knee/Hip Exercises: Aerobic   Nustep  L3 UE/LE x 6 minutes       Knee/Hip Exercises: Standing   Heel Raises  15 reps    Knee Flexion  15 reps    Forward Step Up  Hand Hold: 2;15 reps;Step Height: 2";Left    Other Standing Knee Exercises  standing 3 way hip 15 x each bilateral       Knee/Hip Exercises: Seated   Long Arc Quad  20 reps;Weights    Long Arc Quad Weight  3 lbs.    Hamstring Curl  20 reps    Hamstring Limitations  green    Sit to Sand  10 reps  Knee/Hip Exercises: Supine   Short Arc [employer]  20 reps    Short Arc Quad Sets Limitations  3    Straight Leg Raises  15 reps;Left      Vasopneumatic   Number Minutes Vasopneumatic   10 minutes    Vasopnuematic Location   Knee    Vasopneumatic Pressure  Low    Vasopneumatic Temperature   34      Manual Therapy   Manual therapy comments  PROM emphasis on flexion, patella mobs, STM             PT Education - 12/08/18 1146    Education Details  safe to now use body lotion, cocoa butter, around knee as she has dry skin around incision    Person(s) Educated  Patient    Methods  Explanation    Comprehension  Verbalized understanding          PT Long Term Goals - 11/24/18 1628      PT LONG TERM GOAL #1   Title  PT will be I and compliant with HEP.  (6 weeks 01/05/19)    Status  New      PT LONG TERM GOAL #2   Title  Pt will improve knee ROM to 0-115 deg to improve function. (6 weeks 01/05/19)    Status  New      PT LONG TERM GOAL #3   Title  Pt will improve Lt knee strength to 5/5 to improve funciton. (6  weeks 01/05/19)    Status  New      PT LONG TERM GOAL #4   Title  Pt will be able to ambulate community distances without AD and perform ususal activity with less than 2-3/10 pain. (6 weeks 01/05/19)    Status  New      PT LONG TERM GOAL #5   Title  Pt will improve FOTO to less than 49% limited. (6 weeks 01/05/19)    Status  New            Plan - 12/08/18 1148    Clinical Impression Statement  Pt continues to make good progress in ROM, strenght, gait, and activity tolerance. ROM measuements updated today see above. PT will continue to progress as able toward funcitonal goals.     Rehab Potential  Good    PT Frequency  Other (comment)   2-3   PT Duration  6 weeks    PT Next Visit Plan  inc knee flexion ROM and begin light strengthening and gait. VAso     PT Home Exercise Plan  quad sets, HS stretch, heel slides, LAQ, stand hip abd, SLR    Consulted and Agree with Plan of Care  Patient       Patient will benefit from skilled therapeutic intervention in order to improve the following deficits and impairments:  Abnormal gait, Decreased activity tolerance, Decreased endurance, Decreased range of motion, Decreased strength, Hypomobility, Difficulty walking, Impaired flexibility, Increased fascial restricitons, Increased muscle spasms, Pain  Visit Diagnosis: Acute pain of left knee  Stiffness of left knee, not elsewhere classified  Difficulty in walking, not elsewhere classified  Localized edema     Problem List Patient Active Problem List   Diagnosis Date Noted  . Status post total left knee replacement 11/07/2018  . Primary osteoarthritis of both knees 03/26/2018  . Chronic pain of both knees 07/01/2017  . Unilateral primary osteoarthritis, left knee 07/01/2017  . Unilateral primary osteoarthritis, right knee 07/01/2017  .  Osteoarthritis of left hip 06/08/2016  . Status post total replacement of left hip 06/08/2016  . Chronic cluster headache, not intractable 09/13/2015  .  COPD exacerbation ([place]) 09/13/2015  . Primary snoring 06/17/2014  . 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 Episodic cluster headache   . RECTAL BLEEDING 12/02/2007  . DIARRHEA 12/02/2007  . ABDOMINAL PAIN RIGHT LOWER QUADRANT 12/02/2007  . COLITIS, ULCERATIVE 06/23/2007    Silvestre Mesi 12/08/2018, 11:50 AM  [facility] [address] [place], [place], [address] Phone: [phone]   Fax:  [phone]  Name: JUVIA AERTS MRN: [id number] Date of Birth: May 02, 1956

## 2018-12-10 ENCOUNTER — Encounter: Payer: Self-pay | Admitting: Physical Therapy

## 2018-12-10 ENCOUNTER — Ambulatory Visit: Payer: BLUE CROSS/BLUE SHIELD | Admitting: Physical Therapy

## 2018-12-10 DIAGNOSIS — M25662 Stiffness of left knee, not elsewhere classified: Secondary | ICD-10-CM

## 2018-12-10 DIAGNOSIS — M25562 Pain in left knee: Secondary | ICD-10-CM | POA: Diagnosis not present

## 2018-12-10 DIAGNOSIS — R600 Localized edema: Secondary | ICD-10-CM

## 2018-12-10 DIAGNOSIS — R262 Difficulty in walking, not elsewhere classified: Secondary | ICD-10-CM

## 2018-12-10 NOTE — Therapy (Signed)
[place], [place], [address] Phone: [phone]   Fax:  [phone]  Physical Therapy Treatment  Patient Details  Name: Wendy Crawford MRN: [id number] Date of Birth: 03/27/1956 Referring Provider (PT): Jean Rosenthal, MD   Encounter Date: 12/10/2018  PT End of Session - 12/10/18 1232    Visit Number  5    Number of Visits  16    Authorization Type  BCBS    PT Start Time  1150    PT Stop Time  1240    PT Time Calculation (min)  50 min    Activity Tolerance  Patient tolerated treatment well    Behavior During Therapy  [facility] for tasks assessed/performed       Past Medical History:  Diagnosis Date  . Arthritis   . 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 Colitis    ulcerative  . Depression   . Headache(784.0)    most severe for 2 months,cluster  . Headaches, cluster    02 and medication  . Pneumonia YRS AGO  . Primary snoring 06/17/2014  . Psoriasis     Past Surgical History:  Procedure Laterality Date  . BREAST BIOPSY Right 1990  . BREAST EXCISIONAL BIOPSY Right 1994  . JOINT REPLACEMENT    . TOTAL HIP ARTHROPLASTY Left 06/08/2016   Procedure: LEFT TOTAL HIP ARTHROPLASTY ANTERIOR APPROACH;  Surgeon: Mcarthur Rossetti, MD;  Location: [facility];  Service: Orthopedics;  Laterality: Left;  . TOTAL KNEE ARTHROPLASTY Left 11/07/2018   Procedure: LEFT TOTAL KNEE ARTHROPLASTY;  Surgeon: Mcarthur Rossetti, MD;  Location: [facility];  Service: Orthopedics;  Laterality: Left;    There were no vitals filed for this visit.  Subjective Assessment - 12/10/18 1152    Subjective  Pt c/o pain last night on the back of left knee that radiates down the leg to the ankle. She reports thinking she overdid her HEP last night, specifically in extension.    Pain Score  2     Pain Location  Knee    Pain Descriptors / Indicators  Tingling    Pain Type  Surgical pain    Pain  Radiating Towards  Some numbness down her left leg    Aggravating Factors   Walking and sitting prolonged    Pain Relieving Factors  Ice, elevation, rest         [facility] PT Assessment - 12/10/18 0001      AROM   AROM Assessment Site  Knee    Right/Left Knee  Left    Left Knee Extension  -4    Left Knee Flexion  100                   [facility] Adult PT Treatment/Exercise - 12/10/18 0001      Knee/Hip Exercises: Stretches   Active Hamstring Stretch  2 reps;30 seconds    Other Knee/Hip Stretches  heel slides AAROM 5 sec X 15 reps      Knee/Hip Exercises: Aerobic   Nustep  L3 UE/LE x 6 minutes       Knee/Hip Exercises: Standing   Other Standing Knee Exercises  Mini squats 5 reps      Knee/Hip Exercises: Seated   Long Arc Quad  20 reps    Long Arc Quad Weight  4 lbs.    Heel Slides  20 reps    Sit to Sand  20 reps  Knee/Hip Exercises: Supine   Short Arc Quad Sets  20 reps    Short Arc Quad Sets Limitations  4    Heel Slides  15 reps    Heel Slides Limitations  10 sec hold      Vasopneumatic   Number Minutes Vasopneumatic   15 minutes    Vasopnuematic Location   Knee    Vasopneumatic Pressure  Medium    Vasopneumatic Temperature   34      Manual Therapy   Manual Therapy  Edema management    Manual therapy comments  STM to hamstring    Edema Management  12 minutes                  PT Long Term Goals - 11/24/18 1628      PT LONG TERM GOAL #1   Title  PT will be I and compliant with HEP.  (6 weeks 01/05/19)    Status  New      PT LONG TERM GOAL #2   Title  Pt will improve knee ROM to 0-115 deg to improve function. (6 weeks 01/05/19)    Status  New      PT LONG TERM GOAL #3   Title  Pt will improve Lt knee strength to 5/5 to improve funciton. (6 weeks 01/05/19)    Status  New      PT LONG TERM GOAL #4   Title  Pt will be able to ambulate community distances without AD and perform ususal activity with less than 2-3/10 pain. (6 weeks 01/05/19)     Status  New      PT LONG TERM GOAL #5   Title  Pt will improve FOTO to less than 49% limited. (6 weeks 01/05/19)    Status  New            Plan - 12/10/18 1233    Clinical Impression Statement  Pt tolerated treatment well. Able to improve active knee flexion to 100 deg. Pt is very motivated to improve functional activities to take care of her grandson. Noted improvement in knee extension by 1 deg. Able to complete 5 mini squats at end of treatment. PT will continue to progress pt to functional goals.     Clinical Presentation  Evolving    PT Treatment/Interventions  Cryotherapy;Electrical Stimulation;Iontophoresis 19m/ml Dexamethasone;Moist Heat;Ultrasound;Gait training;Therapeutic activities;Therapeutic exercise;Neuromuscular re-education;Manual techniques;Passive range of motion;Dry needling;Taping;Vasopneumatic Device;Joint Manipulations    PT Next Visit Plan  inc knee flexion ROM and begin strengthening and gait. VAso    PT Home Exercise Plan  quad sets, HS stretch, heel slides, LAQ, stand hip abd, SLR    Consulted and Agree with Plan of Care  Patient      During this treatment session, the therapist was present, participating in and directing the treatment.  Patient will benefit from skilled therapeutic intervention in order to improve the following deficits and impairments:  Abnormal gait, Decreased activity tolerance, Decreased endurance, Decreased range of motion, Decreased strength, Hypomobility, Difficulty walking, Impaired flexibility, Increased fascial restricitons, Increased muscle spasms, Pain  Visit Diagnosis: Acute pain of left knee  Stiffness of left knee, not elsewhere classified  Difficulty in walking, not elsewhere classified  Localized edema     Problem List Patient Active Problem List   Diagnosis Date Noted  . Status post total left knee replacement 11/07/2018  . Primary osteoarthritis of both knees 03/26/2018  . Chronic pain of both knees 07/01/2017  .  Unilateral primary osteoarthritis, left knee  07/01/2017  . Unilateral primary osteoarthritis, right knee 07/01/2017  . Osteoarthritis of left hip 06/08/2016  . Status post total replacement of left hip 06/08/2016  . Chronic cluster headache, not intractable 09/13/2015  . COPD exacerbation ([place]) 09/13/2015  . Primary snoring 06/17/2014  . 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 Episodic cluster headache   . RECTAL BLEEDING 12/02/2007  . DIARRHEA 12/02/2007  . ABDOMINAL PAIN RIGHT LOWER QUADRANT 12/02/2007  . COLITIS, ULCERATIVE 06/23/2007    Fuller Mandril, SPTA 12/10/2018, 12:38 PM   Hessie Diener, PTA 12/10/18 12:54 PM Phone: [phone] Fax: [facility] [place], [place], [address] Phone: [phone]   Fax:  [phone]  Name: Wendy Crawford MRN: [id number] Date of Birth: February 18, 1956

## 2018-12-15 ENCOUNTER — Ambulatory Visit: Payer: BLUE CROSS/BLUE SHIELD | Admitting: Physical Therapy

## 2018-12-15 DIAGNOSIS — R262 Difficulty in walking, not elsewhere classified: Secondary | ICD-10-CM

## 2018-12-15 DIAGNOSIS — M25562 Pain in left knee: Secondary | ICD-10-CM

## 2018-12-15 DIAGNOSIS — R600 Localized edema: Secondary | ICD-10-CM

## 2018-12-15 DIAGNOSIS — M25662 Stiffness of left knee, not elsewhere classified: Secondary | ICD-10-CM

## 2018-12-15 NOTE — Therapy (Signed)
[place], [place], [address] Phone: [phone]   Fax:  [phone]  Physical Therapy Treatment  Patient Details  Name: DESANI SPRUNG MRN: [id number] Date of Birth: [birth year] Referring Provider (PT): Jean Rosenthal, MD   Encounter Date: 12/15/2018  PT End of Session - 12/15/18 1247    Visit Number  6    Number of Visits  16    Date for PT Re-Evaluation  01/05/19    Authorization Type  BCBS    PT Start Time  1105    PT Stop Time  1145    PT Time Calculation (min)  40 min    Activity Tolerance  Patient tolerated treatment well    Behavior During Therapy  [facility] for tasks assessed/performed       Past Medical History:  Diagnosis Date  . Arthritis   . 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 Colitis    ulcerative  . Depression   . Headache(784.0)    most severe for 2 months,cluster  . Headaches, cluster    02 and medication  . Pneumonia YRS AGO  . Primary snoring 06/17/2014  . Psoriasis     Past Surgical History:  Procedure Laterality Date  . BREAST BIOPSY Right 1990  . BREAST EXCISIONAL BIOPSY Right 1994  . JOINT REPLACEMENT    . TOTAL HIP ARTHROPLASTY Left 06/08/2016   Procedure: LEFT TOTAL HIP ARTHROPLASTY ANTERIOR APPROACH;  Surgeon: Mcarthur Rossetti, MD;  Location: [facility];  Service: Orthopedics;  Laterality: Left;  . TOTAL KNEE ARTHROPLASTY Left 11/07/2018   Procedure: LEFT TOTAL KNEE ARTHROPLASTY;  Surgeon: Mcarthur Rossetti, MD;  Location: [facility];  Service: Orthopedics;  Laterality: Left;    There were no vitals filed for this visit.  Subjective Assessment - 12/15/18 1245    Subjective  Pt relays she still uses the cane some but wants to get rid of it whenever PT thinks she can. She relays some knee stiffness but pain is improving    Currently in Pain?  Yes    Pain Score  2     Pain Location  Knee    Pain Orientation  Left    Pain  Descriptors / Indicators  Tightness    Pain Type  Surgical pain         [facility] PT Assessment - 12/15/18 0001      AROM   Left Knee Flexion  102      PROM   Left Knee Flexion  107                   [facility] Adult PT Treatment/Exercise - 12/15/18 0001      Knee/Hip Exercises: Stretches   Active Hamstring Stretch  2 reps;30 seconds    Other Knee/Hip Stretches  heel slides AAROM 5 sec X 20 reps      Knee/Hip Exercises: Aerobic   Nustep  L3 UE/LE x 6 minutes       Knee/Hip Exercises: Standing   Other Standing Knee Exercises  side stepping, retro walking, gait with marching all at counter without UE support up/down X 3      Knee/Hip Exercises: Seated   Long Arc Quad  20 reps    Long Arc Quad Weight  4 lbs.    Sit to [employer]  20 reps      Knee/Hip Exercises: Supine   Short Arc [employer]  20 reps    Short Arc  Quad Sets Limitations  4      Vasopneumatic   Number Minutes Vasopneumatic   --   declined     Manual Therapy   Manual therapy comments  PROM 8 min flexion, ext with more time on flexion             PT Education - 12/15/18 1246    Education Details  she can D/C cane within her house but still needs to use it in community or uneven terrain.     Person(s) Educated  Patient    Methods  Explanation    Comprehension  Verbalized understanding          PT Long Term Goals - 11/24/18 1628      PT LONG TERM GOAL #1   Title  PT will be I and compliant with HEP.  (6 weeks 01/05/19)    Status  New      PT LONG TERM GOAL #2   Title  Pt will improve knee ROM to 0-115 deg to improve function. (6 weeks 01/05/19)    Status  New      PT LONG TERM GOAL #3   Title  Pt will improve Lt knee strength to 5/5 to improve funciton. (6 weeks 01/05/19)    Status  New      PT LONG TERM GOAL #4   Title  Pt will be able to ambulate community distances without AD and perform ususal activity with less than 2-3/10 pain. (6 weeks 01/05/19)    Status  New      PT LONG TERM GOAL #5    Title  Pt will improve FOTO to less than 49% limited. (6 weeks 01/05/19)    Status  New            Plan - 12/15/18 1247    Clinical Impression Statement  Pt continues to make steady improvments in all areas and again improved her knee ROM. She showed good stability with sidestepping, retro walking, and march walking thus she was instructed to discontinue using SPC inside her house but recommended to still use it in community.  She will see MD on thursday so she will need MD progress note next visit.     Rehab Potential  Good    PT Frequency  Other (comment)   2-3   PT Duration  6 weeks    PT Treatment/Interventions  Cryotherapy;Electrical Stimulation;Iontophoresis 77m/ml Dexamethasone;Moist Heat;Ultrasound;Gait training;Therapeutic activities;Therapeutic exercise;Neuromuscular re-education;Manual techniques;Passive range of motion;Dry needling;Taping;Vasopneumatic Device;Joint Manipulations    PT Next Visit Plan  MD progress note, inc knee flexion ROM and  progress strengthening and dynamic gait/balance. VAso if needed    PT Home Exercise Plan  quad sets, HS stretch, heel slides, LAQ, stand hip abd, SLR    Consulted and Agree with Plan of Care  Patient       Patient will benefit from skilled therapeutic intervention in order to improve the following deficits and impairments:  Abnormal gait, Decreased activity tolerance, Decreased endurance, Decreased range of motion, Decreased strength, Hypomobility, Difficulty walking, Impaired flexibility, Increased fascial restricitons, Increased muscle spasms, Pain  Visit Diagnosis: Acute pain of left knee  Stiffness of left knee, not elsewhere classified  Difficulty in walking, not elsewhere classified  Localized edema     Problem List Patient Active Problem List   Diagnosis Date Noted  . Status post total left knee replacement 11/07/2018  . Primary osteoarthritis of both knees 03/26/2018  . Chronic pain of both knees 07/01/2017  .  Unilateral primary osteoarthritis, left knee 07/01/2017  . Unilateral primary osteoarthritis, right knee 07/01/2017  . Osteoarthritis of left hip 06/08/2016  . Status post total replacement of left hip 06/08/2016  . Chronic cluster headache, not intractable 09/13/2015  . COPD exacerbation ([place]) 09/13/2015  . Primary snoring 06/17/2014  . 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 Episodic cluster headache   . RECTAL BLEEDING 12/02/2007  . DIARRHEA 12/02/2007  . ABDOMINAL PAIN RIGHT LOWER QUADRANT 12/02/2007  . COLITIS, ULCERATIVE 06/23/2007    Silvestre Mesi 12/15/2018, 12:51 PM  [facility] [address] [place], [place], [address] Phone: [phone]   Fax:  [phone]  Name: DALTON MILLE MRN: [id number] Date of Birth: 08/08/1956

## 2018-12-17 ENCOUNTER — Ambulatory Visit: Payer: BLUE CROSS/BLUE SHIELD | Admitting: Physical Therapy

## 2018-12-17 ENCOUNTER — Encounter: Payer: Self-pay | Admitting: Physical Therapy

## 2018-12-17 DIAGNOSIS — M25662 Stiffness of left knee, not elsewhere classified: Secondary | ICD-10-CM

## 2018-12-17 DIAGNOSIS — R262 Difficulty in walking, not elsewhere classified: Secondary | ICD-10-CM

## 2018-12-17 DIAGNOSIS — M25562 Pain in left knee: Secondary | ICD-10-CM

## 2018-12-17 DIAGNOSIS — R600 Localized edema: Secondary | ICD-10-CM

## 2018-12-17 NOTE — Therapy (Signed)
[place], [place], [address] Phone: [phone]   Fax:  [phone]  Physical Therapy Treatment  Patient Details  Name: Wendy Crawford MRN: [id number] Date of Birth: 1956-10-19 Referring Provider (PT): Jean Rosenthal, MD   Encounter Date: 12/17/2018  PT End of Session - 12/17/18 1246    Visit Number  7    Number of Visits  16    Date for PT Re-Evaluation  01/05/19    Authorization Type  BCBS    PT Start Time  1143    PT Stop Time  1238    PT Time Calculation (min)  55 min    Activity Tolerance  Patient tolerated treatment well    Behavior During Therapy  [facility] for tasks assessed/performed       Past Medical History:  Diagnosis Date  . Arthritis   . 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 Colitis    ulcerative  . Depression   . Headache(784.0)    most severe for 2 months,cluster  . Headaches, cluster    02 and medication  . Pneumonia YRS AGO  . Primary snoring 06/17/2014  . Psoriasis     Past Surgical History:  Procedure Laterality Date  . BREAST BIOPSY Right 1990  . BREAST EXCISIONAL BIOPSY Right 1994  . JOINT REPLACEMENT    . TOTAL HIP ARTHROPLASTY Left 06/08/2016   Procedure: LEFT TOTAL HIP ARTHROPLASTY ANTERIOR APPROACH;  Surgeon: Mcarthur Rossetti, MD;  [facility];  Service: Orthopedics;  Laterality: Left;  . TOTAL KNEE ARTHROPLASTY Left 11/07/2018   Procedure: LEFT TOTAL KNEE ARTHROPLASTY;  Surgeon: Mcarthur Rossetti, MD;  [facility]: [facility];  Service: Orthopedics;  Laterality: Left;    There were no vitals filed for this visit.  Subjective Assessment - 12/17/18 1147    Subjective  Pt reports minor pain today, but has been getting around her house better without her cane. She said she was up walking around a lot yesterday. She reports starting to sleep on left side this weekend.     Currently in Pain?  Yes    Pain Score  2     Pain  Location  Knee    Pain Orientation  Left    Pain Descriptors / Indicators  Tightness    Pain Type  Surgical pain         [facility] PT Assessment - 12/17/18 0001      AROM   Left Knee Flexion  107   active assist with strap                  [facility] Adult PT Treatment/Exercise - 12/17/18 0001      Knee/Hip Exercises: Stretches   Active Hamstring Stretch  4 reps;20 seconds    Other Knee/Hip Stretches  heel slides AAROM 5 sec X 5reps      Knee/Hip Exercises: Aerobic   Nustep  L3 UE/LE x 6 minutes       Knee/Hip Exercises: Standing   Functional Squat  15 reps    Functional Squat Limitations  at counter top    Other Standing Knee Exercises  side stepping with red TB;   all at counter without UE support up/down X 3      Knee/Hip Exercises: Seated   Long Arc Quad  15 reps    Long Arc Quad Weight  5 lbs.    Stool Scoot - Round Trips  Down hallway and back  Sit to [employer]  20 reps      Knee/Hip Exercises: Supine   Short Arc [employer]  20 reps    Short Arc Quad Sets Limitations  5      Vasopneumatic   Number Minutes Vasopneumatic   10 minutes    Vasopnuematic Location   Knee    Vasopneumatic Pressure  Low    Vasopneumatic Temperature   34             PT Education - 12/17/18 1234    Education Details  Instructed on ther ex and proper foot alignment during close chain activities.    Person(s) Educated  Patient    Methods  Explanation;Demonstration;Verbal cues    Comprehension  Verbalized understanding;Returned demonstration          PT Long Term Goals - 11/24/18 1628      PT LONG TERM GOAL #1   Title  PT will be I and compliant with HEP.  (6 weeks 01/05/19)    Status  New      PT LONG TERM GOAL #2   Title  Pt will improve knee ROM to 0-115 deg to improve function. (6 weeks 01/05/19)    Status  New      PT LONG TERM GOAL #3   Title  Pt will improve Lt knee strength to 5/5 to improve funciton. (6 weeks 01/05/19)    Status  New      PT LONG TERM GOAL #4    Title  Pt will be able to ambulate community distances without AD and perform ususal activity with less than 2-3/10 pain. (6 weeks 01/05/19)    Status  New      PT LONG TERM GOAL #5   Title  Pt will improve FOTO to less than 49% limited. (6 weeks 01/05/19)    Status  New            Plan - 12/17/18 1248    Clinical Impression Statement  Pt stated she has been able to sleep on her left side over the weekend and is able to ambulate around her home more independently without her SPC. Pt demos good stability when TB added to sidestepping and was able to perform functional squats at the countertop; however, she required cues for increased WB on LLE. Pt improved knee flexion to 107deg AAROM. Pt is steadily progressing towards goals.     Rehab Potential  Good    PT Treatment/Interventions  Cryotherapy;Electrical Stimulation;Iontophoresis 83m/ml Dexamethasone;Moist Heat;Ultrasound;Gait training;Therapeutic activities;Therapeutic exercise;Neuromuscular re-education;Manual techniques;Passive range of motion;Dry needling;Taping;Vasopneumatic Device;Joint Manipulations    PT Next Visit Plan  MD progress note, inc knee flexion ROM and  progress strengthening and dynamic gait/balance. VAso if needed; ask about MD visit    PT Home Exercise Plan  quad sets, HS stretch, heel slides, LAQ, stand hip abd, SLR    Consulted and Agree with Plan of Care  Patient      During this treatment session, the therapist was present, participating in and directing the treatment.  Patient will benefit from skilled therapeutic intervention in order to improve the following deficits and impairments:  Abnormal gait, Decreased activity tolerance, Decreased endurance, Decreased range of motion, Decreased strength, Hypomobility, Difficulty walking, Impaired flexibility, Increased fascial restricitons, Increased muscle spasms, Pain  Visit Diagnosis: Acute pain of left knee  Stiffness of left knee, not elsewhere  classified  Difficulty in walking, not elsewhere classified  Localized edema     Problem List Patient Active Problem List  Diagnosis Date Noted  . Status post total left knee replacement 11/07/2018  . Primary osteoarthritis of both knees 03/26/2018  . Chronic pain of both knees 07/01/2017  . Unilateral primary osteoarthritis, left knee 07/01/2017  . Unilateral primary osteoarthritis, right knee 07/01/2017  . Osteoarthritis of left hip 06/08/2016  . Status post total replacement of left hip 06/08/2016  . Chronic cluster headache, not intractable 09/13/2015  . COPD exacerbation ([place]) 09/13/2015  . Primary snoring 06/17/2014  . 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 Episodic cluster headache   . RECTAL BLEEDING 12/02/2007  . DIARRHEA 12/02/2007  . ABDOMINAL PAIN RIGHT LOWER QUADRANT 12/02/2007  . COLITIS, ULCERATIVE 06/23/2007    Fuller Mandril, SPTA 12/17/2018, 12:57 PM   Hessie Diener, PTA 12/17/18 2:03 PM Phone: [phone] Fax: [facility] [place], [place], [address] Phone: [phone]   Fax:  [phone]  Name: Wendy Crawford MRN: [id number] Date of Birth: [birth year]

## 2018-12-18 ENCOUNTER — Encounter (INDEPENDENT_AMBULATORY_CARE_PROVIDER_SITE_OTHER): Payer: Self-pay | Admitting: Orthopaedic Surgery

## 2018-12-18 ENCOUNTER — Ambulatory Visit (INDEPENDENT_AMBULATORY_CARE_PROVIDER_SITE_OTHER): Payer: BLUE CROSS/BLUE SHIELD | Admitting: Orthopaedic Surgery

## 2018-12-18 DIAGNOSIS — Z96652 Presence of left artificial knee joint: Secondary | ICD-10-CM

## 2018-12-18 NOTE — Progress Notes (Signed)
The patient is 6 weeks status post a left total knee arthroplasty.  She is doing well overall.  She is ambulating with a cane but not needing to use it in her house.  On exam her extension is almost full and her flexion is almost full.  The knee feels ligamentously stable.  It is slightly warm and swollen but the incision looks great.  Her calf is soft.  She will finish her last 3 therapy sessions and then can transition to a home exercise program.  She is far ahead of what most people are.  I gave her a lot of encouragement because she is doing so well.  We will see her back in 6 months.  If there is any problems before then she will come and see [place] earlier if needed.  At her 28-monthfollow-up visit we will need an AP and lateral of her left knee.

## 2018-12-22 ENCOUNTER — Ambulatory Visit: Payer: BLUE CROSS/BLUE SHIELD | Admitting: Physical Therapy

## 2018-12-22 DIAGNOSIS — R600 Localized edema: Secondary | ICD-10-CM

## 2018-12-22 DIAGNOSIS — M25662 Stiffness of left knee, not elsewhere classified: Secondary | ICD-10-CM

## 2018-12-22 DIAGNOSIS — R262 Difficulty in walking, not elsewhere classified: Secondary | ICD-10-CM

## 2018-12-22 DIAGNOSIS — M25562 Pain in left knee: Secondary | ICD-10-CM | POA: Diagnosis not present

## 2018-12-22 NOTE — Therapy (Signed)
[place], [place], [address] Phone: [phone]   Fax:  [phone]  Physical Therapy Treatment  Patient Details  Name: Wendy Crawford MRN: [id number] Date of Birth: 12-13-1955 Referring Provider (PT): Jean Rosenthal, MD   Encounter Date: 12/22/2018  PT End of Session - 12/22/18 1148    Visit Number  8    Number of Visits  84   MD recommended DC at 10   Date for PT Re-Evaluation  01/05/19    Authorization Type  BCBS    PT Start Time  1100    PT Stop Time  1150   last 10 min on ice   PT Time Calculation (min)  50 min    Activity Tolerance  Patient tolerated treatment well    Behavior During Therapy  [facility] for tasks assessed/performed       Past Medical History:  Diagnosis Date  . Arthritis   . 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 Colitis    ulcerative  . Depression   . Headache(784.0)    most severe for 2 months,cluster  . Headaches, cluster    02 and medication  . Pneumonia YRS AGO  . Primary snoring 06/17/2014  . Psoriasis     Past Surgical History:  Procedure Laterality Date  . BREAST BIOPSY Right 1990  . BREAST EXCISIONAL BIOPSY Right 1994  . JOINT REPLACEMENT    . TOTAL HIP ARTHROPLASTY Left 06/08/2016   Procedure: LEFT TOTAL HIP ARTHROPLASTY ANTERIOR APPROACH;  Surgeon: Mcarthur Rossetti, MD;  [facility]: [facility];  Service: Orthopedics;  Laterality: Left;  . TOTAL KNEE ARTHROPLASTY Left 11/07/2018   Procedure: LEFT TOTAL KNEE ARTHROPLASTY;  Surgeon: Mcarthur Rossetti, MD;  Location: [facility];  Service: Orthopedics;  Laterality: Left;    There were no vitals filed for this visit.  Subjective Assessment - 12/22/18 1143    Subjective  Pt relays MD appointment went well and MD was pleased, wants her to continue 3 more visits then DC. She also says she does not want to do the stool scoot exercise due to hip discomfort.     Pertinent History   PMH:Lt THA 2017,,OA,headaches,DEP,    Limitations  Sitting;House hold activities;Lifting;Standing;Walking    Patient Stated Goals  get my knee bending and back to normal    Currently in Pain?  Yes    Pain Score  2     Pain Location  Knee    Pain Orientation  Left    Pain Descriptors / Indicators  Dull    Pain Type  Surgical pain    Pain Onset  More than a month ago    Pain Frequency  Intermittent    Multiple Pain Sites  No                       [facility] Adult PT Treatment/Exercise - 12/22/18 0001      Knee/Hip Exercises: Stretches   Active Hamstring Stretch  Left;3 reps;30 seconds    Other Knee/Hip Stretches  heel slides AAROM 5 sec X 20reps      Knee/Hip Exercises: Aerobic   Nustep  L4 UE/LE x 6 minutes       Knee/Hip Exercises: Standing   Hip Flexion  Both;15 reps;Knee bent    Hip Flexion Limitations  2lbs    Hip Abduction  Both;15 reps    Abduction Limitations  2 lbs    Hip Extension  Both;15  reps    Extension Limitations  2 lbs      Knee/Hip Exercises: Seated   Long Arc Quad  Left;20 reps    Long Arc Quad Weight  5 lbs.    Hamstring Curl  Left;20 reps    Hamstring Limitations  red    Sit to Sand  20 reps      Modalities   Modalities  Cryotherapy      Cryotherapy   Number Minutes Cryotherapy  10 Minutes    Cryotherapy Location  Hip;Knee    Type of Cryotherapy  Ice pack      Manual Therapy   Manual therapy comments  PROM and STM 8 min                  PT Long Term Goals - 11/24/18 1628      PT LONG TERM GOAL #1   Title  PT will be I and compliant with HEP.  (6 weeks 01/05/19)    Status  New      PT LONG TERM GOAL #2   Title  Pt will improve knee ROM to 0-115 deg to improve function. (6 weeks 01/05/19)    Status  New      PT LONG TERM GOAL #3   Title  Pt will improve Lt knee strength to 5/5 to improve funciton. (6 weeks 01/05/19)    Status  New      PT LONG TERM GOAL #4   Title  Pt will be able to ambulate community distances  without AD and perform ususal activity with less than 2-3/10 pain. (6 weeks 01/05/19)    Status  New      PT LONG TERM GOAL #5   Title  Pt will improve FOTO to less than 49% limited. (6 weeks 01/05/19)    Status  New            Plan - 12/22/18 1149    Clinical Impression Statement  Pt has made good progress and MD was pleased with her progress. She was able to progress therex today without complaints. PT will plan to see pt for 2 more visits then discharge to HEP per MD recommendations due to her progress.     Rehab Potential  Good    PT Frequency  2x / week    PT Duration  6 weeks    PT Treatment/Interventions  Cryotherapy;Electrical Stimulation;Iontophoresis 12m/ml Dexamethasone;Moist Heat;Ultrasound;Gait training;Therapeutic activities;Therapeutic exercise;Neuromuscular re-education;Manual techniques;Passive range of motion;Dry needling;Taping;Vasopneumatic Device;Joint Manipulations    PT Next Visit Plan  no stool scoots per pt request, progress strength ROM and and balance, 2 more visits per MD    PT Home Exercise Plan  quad sets, HS stretch, heel slides, LAQ, stand hip abd, SLR    Consulted and Agree with Plan of Care  Patient       Patient will benefit from skilled therapeutic intervention in order to improve the following deficits and impairments:  Abnormal gait, Decreased activity tolerance, Decreased endurance, Decreased range of motion, Decreased strength, Hypomobility, Difficulty walking, Impaired flexibility, Increased fascial restricitons, Increased muscle spasms, Pain  Visit Diagnosis: Acute pain of left knee  Stiffness of left knee, not elsewhere classified  Difficulty in walking, not elsewhere classified  Localized edema     Problem List Patient Active Problem List   Diagnosis Date Noted  . Status post total left knee replacement 11/07/2018  . Primary osteoarthritis of both knees 03/26/2018  . Chronic pain of both knees 07/01/2017  .  Unilateral primary  osteoarthritis, left knee 07/01/2017  . Unilateral primary osteoarthritis, right knee 07/01/2017  . Osteoarthritis of left hip 06/08/2016  . Status post total replacement of left hip 06/08/2016  . Chronic cluster headache, not intractable 09/13/2015  . COPD exacerbation ([place]) 09/13/2015  . Primary snoring 06/17/2014  . 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 Episodic cluster headache   . RECTAL BLEEDING 12/02/2007  . DIARRHEA 12/02/2007  . ABDOMINAL PAIN RIGHT LOWER QUADRANT 12/02/2007  . COLITIS, ULCERATIVE 06/23/2007    Silvestre Mesi 12/22/2018, 11:54 AM  [facility] [address] [place], [place], [address] Phone: [phone]   Fax:  [phone]  Name: Wendy Crawford MRN: [id number] Date of Birth: Aug 29, 1956

## 2018-12-24 ENCOUNTER — Ambulatory Visit: Payer: BLUE CROSS/BLUE SHIELD | Admitting: Physical Therapy

## 2018-12-24 DIAGNOSIS — M25562 Pain in left knee: Secondary | ICD-10-CM

## 2018-12-24 DIAGNOSIS — M25662 Stiffness of left knee, not elsewhere classified: Secondary | ICD-10-CM

## 2018-12-24 DIAGNOSIS — R600 Localized edema: Secondary | ICD-10-CM

## 2018-12-24 DIAGNOSIS — R262 Difficulty in walking, not elsewhere classified: Secondary | ICD-10-CM

## 2018-12-24 NOTE — Therapy (Signed)
[place], [place], [address] Phone: [phone]   Fax:  [phone]  Physical Therapy Treatment  Patient Details  Name: Wendy Crawford MRN: [id number] Date of Birth: [birth year] Referring Provider (PT): Jean Rosenthal, MD   Encounter Date: 12/24/2018  PT End of Session - 12/24/18 1143    Visit Number  9    Number of Visits  10   MD recommended DC at 10   Date for PT Re-Evaluation  01/05/19    Authorization Type  BCBS    PT Start Time  1100    PT Stop Time  1140    PT Time Calculation (min)  40 min    Activity Tolerance  Patient tolerated treatment well    Behavior During Therapy  [facility] for tasks assessed/performed       Past Medical History:  Diagnosis Date  . Arthritis   . 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 Colitis    ulcerative  . Depression   . Headache(784.0)    most severe for 2 months,cluster  . Headaches, cluster    02 and medication  . Pneumonia YRS AGO  . Primary snoring 06/17/2014  . Psoriasis     Past Surgical History:  Procedure Laterality Date  . BREAST BIOPSY Right 1990  . BREAST EXCISIONAL BIOPSY Right 1994  . JOINT REPLACEMENT    . TOTAL HIP ARTHROPLASTY Left 06/08/2016   Procedure: LEFT TOTAL HIP ARTHROPLASTY ANTERIOR APPROACH;  Surgeon: Mcarthur Rossetti, MD;  [facility];  Service: Orthopedics;  Laterality: Left;  . TOTAL KNEE ARTHROPLASTY Left 11/07/2018   Procedure: LEFT TOTAL KNEE ARTHROPLASTY;  Surgeon: Mcarthur Rossetti, MD;  [facility];  Service: Orthopedics;  Laterality: Left;    There were no vitals filed for this visit.  Subjective Assessment - 12/24/18 1131    Subjective  Pt relays no new complaints today    Currently in Pain?  Yes    Pain Score  2     Pain Location  Knee    Pain Orientation  Left    Pain Descriptors / Indicators  Dull    Pain Type  Surgical pain         [facility] PT  Assessment - 12/24/18 0001      Assessment   Medical Diagnosis  Lt TKA     Referring Provider (PT)  Jean Rosenthal, MD      PROM   Left Knee Flexion  118                   [facility] Adult PT Treatment/Exercise - 12/24/18 1131      Knee/Hip Exercises: Stretches   Active Hamstring Stretch  Left;3 reps;30 seconds    Other Knee/Hip Stretches  heel slides AAROM 5 sec X 20reps      Knee/Hip Exercises: Aerobic   Nustep  L5 UE/LE x 6 minutes       Knee/Hip Exercises: Standing   Knee Flexion  15 reps    Knee Flexion Limitations  2.5 lbs    Hip Flexion  Both;15 reps;Knee bent    Hip Flexion Limitations  2.5lbs    Hip Abduction  Both;15 reps    Abduction Limitations  2.5 lbs    Hip Extension  Both;15 reps    Extension Limitations  2.5 lbs    Forward Step Up  Step Height: 4";10 reps      Knee/Hip Exercises: Seated   Long  Arc [employer] reps    Long Arc Quad Weight  5 lbs.    Hamstring Curl  --    Hamstring Limitations  --    Sit to Sand  20 reps      Modalities   Modalities  --   declined     Cryotherapy   Cryotherapy Location  --      Manual Therapy   Manual therapy comments  PROM, knee mobs 8 min                  PT Long Term Goals - 11/24/18 1628      PT LONG TERM GOAL #1   Title  PT will be I and compliant with HEP.  (6 weeks 01/05/19)    Status  New      PT LONG TERM GOAL #2   Title  Pt will improve knee ROM to 0-115 deg to improve function. (6 weeks 01/05/19)    Status  New      PT LONG TERM GOAL #3   Title  Pt will improve Lt knee strength to 5/5 to improve funciton. (6 weeks 01/05/19)    Status  New      PT LONG TERM GOAL #4   Title  Pt will be able to ambulate community distances without AD and perform ususal activity with less than 2-3/10 pain. (6 weeks 01/05/19)    Status  New      PT LONG TERM GOAL #5   Title  Pt will improve FOTO to less than 49% limited. (6 weeks 01/05/19)    Status  New            Plan - 12/24/18 1144     Clinical Impression Statement  Pt again showed impoved knee flexion ROM and now has good ROM and strength. MD has recommended DC at 10 visits which will be next visit and PT agrees.     Rehab Potential  Good    PT Frequency  2x / week    PT Duration  6 weeks    PT Treatment/Interventions  Cryotherapy;Electrical Stimulation;Iontophoresis 3m/ml Dexamethasone;Moist Heat;Ultrasound;Gait training;Therapeutic activities;Therapeutic exercise;Neuromuscular re-education;Manual techniques;Passive range of motion;Dry needling;Taping;Vasopneumatic Device;Joint Manipulations    PT Next Visit Plan  update goals and measurments for DC    PT Home Exercise Plan  quad sets, HS stretch, heel slides, LAQ, stand hip abd, SLR    Consulted and Agree with Plan of Care  Patient       Patient will benefit from skilled therapeutic intervention in order to improve the following deficits and impairments:  Abnormal gait, Decreased activity tolerance, Decreased endurance, Decreased range of motion, Decreased strength, Hypomobility, Difficulty walking, Impaired flexibility, Increased fascial restricitons, Increased muscle spasms, Pain  Visit Diagnosis: Acute pain of left knee  Stiffness of left knee, not elsewhere classified  Difficulty in walking, not elsewhere classified  Localized edema     Problem List Patient Active Problem List   Diagnosis Date Noted  . Status post total left knee replacement 11/07/2018  . Primary osteoarthritis of both knees 03/26/2018  . Chronic pain of both knees 07/01/2017  . Unilateral primary osteoarthritis, left knee 07/01/2017  . Unilateral primary osteoarthritis, right knee 07/01/2017  . Osteoarthritis of left hip 06/08/2016  . Status post total replacement of left hip 06/08/2016  . Chronic cluster headache, not intractable 09/13/2015  . COPD exacerbation ([place] 09/13/2015  . Primary snoring 06/17/2014  . 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  Episodic cluster headache   . RECTAL BLEEDING 12/02/2007  . DIARRHEA 12/02/2007  . ABDOMINAL PAIN RIGHT LOWER QUADRANT 12/02/2007  . COLITIS, ULCERATIVE 06/23/2007    Silvestre Mesi 12/24/2018, 11:46 AM  [facility] [address] [place], [place], [address] Phone: [phone]   Fax:  [phone]  Name: Wendy Crawford MRN: [id number] Date of Birth: 12-Apr-1956

## 2018-12-29 ENCOUNTER — Ambulatory Visit: Payer: BLUE CROSS/BLUE SHIELD | Admitting: Physical Therapy

## 2018-12-29 DIAGNOSIS — M25562 Pain in left knee: Secondary | ICD-10-CM

## 2018-12-29 DIAGNOSIS — R262 Difficulty in walking, not elsewhere classified: Secondary | ICD-10-CM

## 2018-12-29 DIAGNOSIS — R600 Localized edema: Secondary | ICD-10-CM

## 2018-12-29 DIAGNOSIS — M25662 Stiffness of left knee, not elsewhere classified: Secondary | ICD-10-CM

## 2018-12-29 NOTE — Therapy (Signed)
[place], [place], [address] Phone: [phone]   Fax:  [phone]  Physical Therapy Treatment/Discharge  Patient Details  Name: Wendy Crawford MRN: [id number] Date of Birth: 1956/02/19 Referring Provider (PT): Jean Rosenthal, MD   Encounter Date: 12/29/2018  PT End of Session - 12/29/18 1119    Visit Number  10    Number of Visits  10    Date for PT Re-Evaluation  01/05/19    Authorization Type  BCBS    PT Start Time  1100    PT Stop Time  1140    PT Time Calculation (min)  40 min    Activity Tolerance  Patient tolerated treatment well    Behavior During Therapy  [facility] for tasks assessed/performed       Past Medical History:  Diagnosis Date  . Arthritis   . 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 Colitis    ulcerative  . Depression   . Headache(784.0)    most severe for 2 months,cluster  . Headaches, cluster    02 and medication  . Pneumonia YRS AGO  . Primary snoring 06/17/2014  . Psoriasis     Past Surgical History:  Procedure Laterality Date  . BREAST BIOPSY Right 1990  . BREAST EXCISIONAL BIOPSY Right 1994  . JOINT REPLACEMENT    . TOTAL HIP ARTHROPLASTY Left 06/08/2016   Procedure: LEFT TOTAL HIP ARTHROPLASTY ANTERIOR APPROACH;  Surgeon: Mcarthur Rossetti, MD;  [facility];  Service: Orthopedics;  Laterality: Left;  . TOTAL KNEE ARTHROPLASTY Left 11/07/2018   Procedure: LEFT TOTAL KNEE ARTHROPLASTY;  Surgeon: Mcarthur Rossetti, MD;  [facility];  Service: Orthopedics;  Laterality: Left;    There were no vitals filed for this visit.  Subjective Assessment - 12/29/18 1108    Subjective  Pt relays no complaints and she feels ready for DC.     Patient Stated Goals  get my knee bending and back to normal    Currently in Pain?  No/denies         [facility] PT Assessment - 12/29/18 0001      Assessment   Medical Diagnosis  Lt TKA      Referring Provider (PT)  Jean Rosenthal, MD      ROM / Strength   AROM / PROM / Strength  AROM;Strength;PROM      AROM   Left Knee Extension  0    Left Knee Flexion  117   AAROM     PROM   Left Knee Extension  0    Left Knee Flexion  120      Strength   Strength Assessment Site  Knee    Right/Left Knee  Left    Left Knee Flexion  5/5    Left Knee Extension  5/5                   [facility] Adult PT Treatment/Exercise - 12/29/18 0001      Knee/Hip Exercises: Stretches   Active Hamstring Stretch  Left;2 reps;30 seconds    Other Knee/Hip Stretches  heel slides AAROM 5 sec X 20reps      Knee/Hip Exercises: Aerobic   Nustep  L7 UE/LE x 7 minutes       Knee/Hip Exercises: Standing   Knee Flexion  20 reps    Knee Flexion Limitations  3 lbs    Hip Flexion  20 reps    Hip  Flexion Limitations  3 lbs    Hip Abduction  20 reps    Abduction Limitations  3 lbs    Hip Extension  20 reps    Extension Limitations  3 lbs    Lateral Step Up  Left;15 reps;Step Height: 6"    Forward Step Up  15 reps;Step Height: 6"    Functional Squat  15 reps    Other Standing Knee Exercises  march walk, tandem walk, retrowalk, lateral walk at counter up/back X 3 ea      Knee/Hip Exercises: Seated   Long Arc Quad  Left;20 reps    Long Arc Quad Weight  3 lbs.    Sit to Sand  20 reps;without UE support                  PT Long Term Goals - 12/29/18 1105      PT LONG TERM GOAL #1   Title  PT will be I and compliant with HEP.  (6 weeks 01/05/19)    Status  Achieved      PT LONG TERM GOAL #2   Title  Pt will improve knee ROM to 0-115 deg to improve function. (6 weeks 01/05/19)    Status  New      PT LONG TERM GOAL #3   Title  Pt will improve Lt knee strength to 5/5 to improve funciton. (6 weeks 01/05/19)    Status  New      PT LONG TERM GOAL #4   Title  Pt will be able to ambulate community distances without AD and perform ususal activity with less than 2-3/10 pain. (6  weeks 01/05/19)    Status  Achieved      PT LONG TERM GOAL #5   Title  Pt will improve FOTO to less than 49% limited. (6 weeks 01/05/19)    Status  Achieved            Plan - 12/29/18 1122    Clinical Impression Statement  Pt has made excellent progress with PT, she has met all PT goals and has good ROM and strength. She feels confident she can continue to progress on with her HEP at home and feels ready for DC. She will be DC today due to progress    PT Next Visit Plan  DC today    Consulted and Agree with Plan of Care  Patient       Patient will benefit from skilled therapeutic intervention in order to improve the following deficits and impairments:     Visit Diagnosis: Acute pain of left knee  Stiffness of left knee, not elsewhere classified  Difficulty in walking, not elsewhere classified  Localized edema     Problem List Patient Active Problem List   Diagnosis Date Noted  . Status post total left knee replacement 11/07/2018  . Primary osteoarthritis of both knees 03/26/2018  . Chronic pain of both knees 07/01/2017  . Unilateral primary osteoarthritis, left knee 07/01/2017  . Unilateral primary osteoarthritis, right knee 07/01/2017  . Osteoarthritis of left hip 06/08/2016  . Status post total replacement of left hip 06/08/2016  . Chronic cluster headache, not intractable 09/13/2015  . COPD exacerbation ([place]) 09/13/2015  . Primary snoring 06/17/2014  . 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 Episodic cluster headache   . RECTAL BLEEDING 12/02/2007  . DIARRHEA 12/02/2007  . ABDOMINAL PAIN RIGHT LOWER QUADRANT 12/02/2007  . COLITIS, ULCERATIVE 06/23/2007    Marrianne Mood  Theodoro Koval,PT,DPT 12/29/2018, 11:40 AM PHYSICAL THERAPY DISCHARGE SUMMARY  Visits from Start of Care: 10 Current functional level related to goals / functional outcomes: 38 on FOTO, met all PT goals   Remaining deficits: none   Education /  Equipment: HEP Plan: Patient agrees to discharge.  Patient goals were met. Patient is being discharged due to meeting the stated rehab goals.  ?????    Elsie Ra, PT, DPT 12/29/18 11:41 AM  [facility] [address] [place], [place], [address] Phone: [phone]   Fax:  [phone]  Name: ZIOMARA BIRENBAUM MRN: [id number] Date of Birth: Mar 11, 1956

## 2019-02-27 ENCOUNTER — Ambulatory Visit (INDEPENDENT_AMBULATORY_CARE_PROVIDER_SITE_OTHER): Payer: BLUE CROSS/BLUE SHIELD | Admitting: Family Medicine

## 2019-02-27 ENCOUNTER — Other Ambulatory Visit: Payer: Self-pay | Admitting: Family Medicine

## 2019-02-27 ENCOUNTER — Other Ambulatory Visit: Payer: Self-pay

## 2019-02-27 ENCOUNTER — Encounter (INDEPENDENT_AMBULATORY_CARE_PROVIDER_SITE_OTHER): Payer: Self-pay | Admitting: Family Medicine

## 2019-02-27 DIAGNOSIS — Z720 Tobacco use: Secondary | ICD-10-CM

## 2019-02-27 DIAGNOSIS — M25561 Pain in right knee: Secondary | ICD-10-CM | POA: Diagnosis not present

## 2019-02-27 DIAGNOSIS — G441 Vascular headache, not elsewhere classified: Secondary | ICD-10-CM

## 2019-02-27 MED ORDER — METHYLPREDNISOLONE ACETATE 40 MG/ML IJ SUSP: 40.0000 mg | Freq: Once | INTRAMUSCULAR | Status: DC

## 2019-02-27 MED ORDER — CITALOPRAM HYDROBROMIDE 20 MG PO TABS: 20.0000 mg | ORAL_TABLET | Freq: Every day | ORAL | 3 refills | Status: DC

## 2019-02-27 NOTE — Progress Notes (Signed)
Office Visit Note   Patient: Wendy Crawford           Date of Birth: 03/04/1956           MRN: [id number] Visit Date: 02/27/2019 Requested by: [name], MD [address] [place], [place] [address] PCP: [name], MD  Subjective: Chief Complaint  Patient presents with  . Right Knee - Pain    Felt a pop in the knee after standing up from sitting on a bench, after doing yardwork 2 days ago.  Pain medial/lateral knee. Walking with a cane since.    HPI: She is here with right knee pain.  About 2 days ago she was doing some yard work sitting on a bench, she stood up and felt 2 separate pops in her knee.  She has had pain on the medial and lateral aspect since then, walking with a cane.  She has a history of osteoarthritis and at some point plans to proceed with knee replacement.  She has had her left knee and hip replaced with good results.              ROS: No history of gout or pseudogout.  All other systems were reviewed and are negative.  Objective: Vital Signs: There were no vitals taken for this visit.  Physical Exam:  General:  Alert and oriented, in no acute distress. Pulm:  Breathing unlabored. Psy:  Normal mood, congruent affect. Skin: No rash on her skin. Right knee: Flexion of 90 degrees.  1-2+ effusion with slight increased warmth, no erythema.  Tender on the lateral patellofemoral joint and the medial joint line.  Imaging: None today.  Assessment & Plan: 1.  Right knee pain and effusion, most likely aggravation of patellofemoral arthritis. -Discussed options with patient, she has done well in the past with aspiration and injection and she would like to do that again today.  She will follow-up as needed.     Procedures: Right knee aspiration and injection: After sterile prep with Betadine, injected 3 cc 1% lidocaine without epinephrine and aspirated 20 cc of blood-tinged synovial fluid, then injected 40 mg methylprednisolone from superolateral  approach.    PMFS History: Patient Active Problem List   Diagnosis Date Noted  . Status post total left knee replacement 11/07/2018  . Primary osteoarthritis of both knees 03/26/2018  . Chronic pain of both knees 07/01/2017  . Unilateral primary osteoarthritis, left knee 07/01/2017  . Unilateral primary osteoarthritis, right knee 07/01/2017  . Osteoarthritis of left hip 06/08/2016  . Status post total replacement of left hip 06/08/2016  . Chronic cluster headache, not intractable 09/13/2015  . COPD exacerbation ([place]) 09/13/2015  . Primary snoring 06/17/2014  . 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 Episodic cluster headache   . RECTAL BLEEDING 12/02/2007  . DIARRHEA 12/02/2007  . ABDOMINAL PAIN RIGHT LOWER QUADRANT 12/02/2007  . COLITIS, ULCERATIVE 06/23/2007   Past Medical History:  Diagnosis Date  . Arthritis   . 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 Colitis    ulcerative  . Depression   . Headache(784.0)    most severe for 2 months,cluster  . Headaches, cluster    02 and medication  . Pneumonia YRS AGO  . Primary snoring 06/17/2014  . Psoriasis     Family History  Problem Relation Age of Onset  . Hypertension Mother   . Diabetes Sister   . Diabetes Brother   .  Heart disease Maternal Grandmother   . Heart disease Maternal Grandfather   . Colon cancer Neg Hx     Past Surgical History:  Procedure Laterality Date  . BREAST BIOPSY Right 1990  . BREAST EXCISIONAL BIOPSY Right 1994  . JOINT REPLACEMENT    . TOTAL HIP ARTHROPLASTY Left 06/08/2016   Procedure: LEFT TOTAL HIP ARTHROPLASTY ANTERIOR APPROACH;  Surgeon: Mcarthur Rossetti, MD;  [facility];  Service: Orthopedics;  Laterality: Left;  . TOTAL KNEE ARTHROPLASTY Left 11/07/2018   Procedure: LEFT TOTAL KNEE ARTHROPLASTY;  Surgeon: Mcarthur Rossetti, MD;  [facility]: [facility];  Service: Orthopedics;  Laterality: Left;   Social History    Occupational History  . Occupation: self employed  Tobacco Use  . Smoking status: Current Every Day Smoker    Packs/day: 0.50    Years: 42.00    Pack years: 21.00    Types: Cigarettes  . Smokeless tobacco: Never Used  Substance and Sexual Activity  . Alcohol use: No    Alcohol/week: 0.0 standard drinks  . Drug use: No  . Sexual activity: Not on file

## 2019-05-15 ENCOUNTER — Other Ambulatory Visit: Payer: Self-pay | Admitting: Family Medicine

## 2019-06-18 ENCOUNTER — Ambulatory Visit: Payer: Self-pay | Admitting: Orthopaedic Surgery

## 2019-07-13 ENCOUNTER — Encounter: Payer: Self-pay | Admitting: Family Medicine

## 2019-07-13 ENCOUNTER — Ambulatory Visit (INDEPENDENT_AMBULATORY_CARE_PROVIDER_SITE_OTHER): Payer: BLUE CROSS/BLUE SHIELD | Admitting: Family Medicine

## 2019-07-13 DIAGNOSIS — M25561 Pain in right knee: Secondary | ICD-10-CM | POA: Diagnosis not present

## 2019-07-13 NOTE — Progress Notes (Signed)
Office Visit Note   Patient: Wendy Crawford           Date of Birth: 1956/07/08           MRN: [id number] Visit Date: 07/13/2019 Requested by: [name], MD [id number] [place],  [place] [id number] PCP: [name], MD  Subjective: Chief Complaint  Patient presents with  . Right Knee - Pain    Pain and swelling since last Thursday. Was on vacation and had been walking in the sand while in the water at [facility]. Knee had gotten better after aspiration/injection in March.    HPI: She is here with recurrent right knee pain.  She did well until this past Thursday when she was at the beach.  She started noticing pain and swelling again.  She wonders whether an injection will help.              ROS: No fevers or chills.  All other systems were reviewed and are negative.  Objective: Vital Signs: There were no vitals taken for this visit.  Physical Exam:  General:  Alert and oriented, in no acute distress. Pulm:  Breathing unlabored. Psy:  Normal mood, congruent affect. Skin: No rash on the skin. Right knee: 1+ effusion, no warmth or erythema.  Tender on the medial joint line and pain with patella compression.  Imaging: None today.  Assessment & Plan: 1.  Right knee pain with effusion with underlying DJD -Discussed with her and elected to inject again with steroid.  Could contemplate Visco supplementation in the future.     Procedures: Right knee aspiration and injection: After sterile prep with Betadine, injected 5 cc 1% lidocaine without epinephrine then aspirated 8 cc blood-tinged synovial fluid, then injected 40 mg methylprednisolone from superolateral approach.   PMFS History: Patient Active Problem List   Diagnosis Date Noted  . Status post total left knee replacement 11/07/2018  . Primary osteoarthritis of both knees 03/26/2018  . Chronic pain of both knees 07/01/2017  . Unilateral primary osteoarthritis, left knee 07/01/2017  . Unilateral  primary osteoarthritis, right knee 07/01/2017  . Osteoarthritis of left hip 06/08/2016  . Status post total replacement of left hip 06/08/2016  . Chronic cluster headache, not intractable 09/13/2015  . COPD exacerbation ([place]) 09/13/2015  . Primary snoring 06/17/2014  . 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 Episodic cluster headache   . RECTAL BLEEDING 12/02/2007  . DIARRHEA 12/02/2007  . ABDOMINAL PAIN RIGHT LOWER QUADRANT 12/02/2007  . COLITIS, ULCERATIVE 06/23/2007   Past Medical History:  Diagnosis Date  . Arthritis   . 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 Colitis    ulcerative  . Depression   . Headache(784.0)    most severe for 2 months,cluster  . Headaches, cluster    02 and medication  . Pneumonia YRS AGO  . Primary snoring 06/17/2014  . Psoriasis     Family History  Problem Relation Age of Onset  . Hypertension Mother   . Diabetes Sister   . Diabetes Brother   . Heart disease Maternal Grandmother   . Heart disease Maternal Grandfather   . Colon cancer Neg Hx     Past Surgical History:  Procedure Laterality Date  . BREAST BIOPSY Right 1990  . BREAST EXCISIONAL BIOPSY Right 1994  . JOINT REPLACEMENT    . TOTAL HIP ARTHROPLASTY Left 06/08/2016   Procedure: LEFT TOTAL HIP ARTHROPLASTY ANTERIOR APPROACH;  Surgeon: Mcarthur Rossetti, MD;  Location: [facility];  Service: Orthopedics;  Laterality: Left;  . TOTAL KNEE ARTHROPLASTY Left 11/07/2018   Procedure: LEFT TOTAL KNEE ARTHROPLASTY;  Surgeon: Mcarthur Rossetti, MD;  Location: [facility];  Service: Orthopedics;  Laterality: Left;   Social History   Occupational History  . Occupation: self employed  Tobacco Use  . Smoking status: Current Every Day Smoker    Packs/day: 0.50    Years: 42.00    Pack years: 21.00    Types: Cigarettes  . Smokeless tobacco: Never Used  Substance and Sexual Activity  . Alcohol use: No    Alcohol/week: 0.0  standard drinks  . Drug use: No  . Sexual activity: Not on file

## 2019-08-11 ENCOUNTER — Other Ambulatory Visit (INDEPENDENT_AMBULATORY_CARE_PROVIDER_SITE_OTHER): Payer: Self-pay | Admitting: Orthopaedic Surgery

## 2019-08-11 NOTE — Telephone Encounter (Signed)
Please advise 

## 2019-11-08 ENCOUNTER — Other Ambulatory Visit: Payer: Self-pay | Admitting: Family Medicine

## 2020-03-08 ENCOUNTER — Other Ambulatory Visit: Payer: Self-pay | Admitting: Family Medicine

## 2020-03-15 ENCOUNTER — Other Ambulatory Visit: Payer: Self-pay

## 2020-03-15 ENCOUNTER — Ambulatory Visit: Payer: 59 | Admitting: Family Medicine

## 2020-03-15 ENCOUNTER — Encounter: Payer: Self-pay | Admitting: Family Medicine

## 2020-03-15 VITALS — BP 110/68 | HR 92 | Temp 97.0°F | Resp 18 | Ht 61.0 in | Wt 250.0 lb

## 2020-03-15 DIAGNOSIS — Z720 Tobacco use: Secondary | ICD-10-CM | POA: Diagnosis not present

## 2020-03-15 DIAGNOSIS — G441 Vascular headache, not elsewhere classified: Secondary | ICD-10-CM | POA: Diagnosis not present

## 2020-03-15 DIAGNOSIS — Z13220 Encounter for screening for lipoid disorders: Secondary | ICD-10-CM

## 2020-03-15 MED ORDER — DILTIAZEM HCL ER COATED BEADS 180 MG PO CP24: ORAL_CAPSULE | ORAL | 3 refills | Status: DC

## 2020-03-15 MED ORDER — CITALOPRAM HYDROBROMIDE 20 MG PO TABS: 20.0000 mg | ORAL_TABLET | Freq: Every day | ORAL | 3 refills | Status: DC

## 2020-03-15 NOTE — Progress Notes (Signed)
Subjective:    Patient ID: Wendy Crawford, female    DOB: 23-Sep-1956, 64 y.o.   MRN: [id number]  HPI Patient is a 64 year old smoker with a history of hypertension as well as cluster headaches for which she takes Cardizem.  Blood pressure today is well controlled at 110/68.  She denies any headaches since taking the Cardizem on a daily basis.  She is requesting a refill on the Cardizem.  She is also on citalopram for depression.  She denies any symptoms of uncontrolled depression such as anhedonia or suicidal ideation.  She is overdue for mammogram but she declines to allow me to schedule this.  She prefers to schedule this herself.  She is receiving the Covid vaccination currently.  She is due for pneumonia vaccine, Pneumovax 23 however she declines that at present due to the Covid vaccination schedule.  She is overdue for fasting lab work. Past Medical History:  Diagnosis Date  . Arthritis   . 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 Colitis    ulcerative  . Depression   . Headache(784.0)    most severe for 2 months,cluster  . Headaches, cluster    02 and medication  . Pneumonia YRS AGO  . Primary snoring 06/17/2014  . Psoriasis    Past Surgical History:  Procedure Laterality Date  . BREAST BIOPSY Right 1990  . BREAST EXCISIONAL BIOPSY Right 1994  . JOINT REPLACEMENT    . TOTAL HIP ARTHROPLASTY Left 06/08/2016   Procedure: LEFT TOTAL HIP ARTHROPLASTY ANTERIOR APPROACH;  Surgeon: Mcarthur Rossetti, MD;  Location: [facility];  Service: Orthopedics;  Laterality: Left;  . TOTAL KNEE ARTHROPLASTY Left 11/07/2018   Procedure: LEFT TOTAL KNEE ARTHROPLASTY;  Surgeon: Mcarthur Rossetti, MD;  Location: [facility];  Service: Orthopedics;  Laterality: Left;   Current Outpatient Medications on File Prior to Visit  Medication Sig Dispense Refill  . acetaminophen (TYLENOL) 500 MG tablet Take 1,000 mg by mouth 2 (two) times daily as needed for moderate pain or  headache.     . Multiple Vitamins-Minerals (MULTIVITAMIN ADULT) CHEW Chew 2 each by mouth daily.     No current facility-administered medications on file prior to visit.   Allergies  Allergen Reactions  . Sulfa Antibiotics Itching  . Sulfonamide Derivatives Itching   Social History   Socioeconomic History  . Marital status: Married    Spouse name: [name]  . Number of children: 0  . Years of education: 36  . Highest education level: Not on file  Occupational History  . Occupation: self employed  Tobacco Use  . Smoking status: Current Every Day Smoker    Packs/day: 0.50    Years: 42.00    Pack years: 21.00    Types: Cigarettes  . Smokeless tobacco: Never Used  Substance and Sexual Activity  . Alcohol use: No    Alcohol/week: 0.0 standard drinks  . Drug use: No  . Sexual activity: Not on file  Other Topics Concern  . Not on file  Social History Narrative   Patient is married [name] and lives at home with her husband.   Patient is working full-time.   Patient has a high school education.   Patient is right-handed.   Patient does not drinks any caffeine.   Social Determinants of Health   Financial Resource Strain:   . Difficulty of Paying Living Expenses:   Food Insecurity:   . Worried About Charity fundraiser in the Last Year:   .  Ran Out of Food in the Last Year:   Transportation Needs:   . Film/video editor (Medical):   Marland Kitchen Lack of Transportation (Non-Medical):   Physical Activity:   . Days of Exercise per Week:   . Minutes of Exercise per Session:   Stress:   . Feeling of Stress :   Social Connections:   . Frequency of Communication with Friends and Family:   . Frequency of Social Gatherings with Friends and Family:   . Attends Religious Services:   . Active Member of Clubs or Organizations:   . Attends Archivist Meetings:   Marland Kitchen Marital Status:   Intimate Partner Violence:   . Fear of Current or Ex-Partner:   . Emotionally Abused:   Marland Kitchen  Physically Abused:   . Sexually Abused:       Review of Systems  All other systems reviewed and are negative.      Objective:   Physical Exam  Constitutional: She appears well-developed and well-nourished.  HENT:  Right Ear: Tympanic membrane and ear canal normal.  Mouth/Throat: Oropharynx is clear and moist. No oropharyngeal exudate.  Cardiovascular: Normal rate, regular rhythm and normal heart sounds.  Pulmonary/Chest: Effort normal. No respiratory distress. She has wheezes. She has no rales.  Musculoskeletal:     Cervical back: Neck supple.  Lymphadenopathy:    She has no cervical adenopathy.  Vitals reviewed.         Assessment & Plan:  Screening cholesterol level - Plan: CBC with Differential/Platelet, COMPLETE METABOLIC PANEL WITH GFR, LDL Cholesterol, Direct  Vascular headache, not intractable - Plan: citalopram (CELEXA) 20 MG tablet  Nicotine abuse - Plan: citalopram (CELEXA) 20 MG tablet  Check lab work including a CBC, CMP, LDL direct.  Blood pressure today is well controlled.  Strongly encourage smoking cessation.  Patient is precontemplative and has no desire to quit at the present time.  Refill the patient's Celexa as well as her Cardizem which she takes for cluster headaches.  Recommended a mammogram but the patient prefers to schedule this herself.  Recommended Pneumovax 23 1 month after she receives the Covid vaccination.

## 2020-03-16 LAB — CBC WITH DIFFERENTIAL/PLATELET
Absolute Monocytes: 545 cells/uL (ref 200–950)
Basophils Absolute: 28 cells/uL (ref 0–200)
Basophils Relative: 0.4 %
Eosinophils Absolute: 7 cells/uL — ABNORMAL LOW (ref 15–500)
Eosinophils Relative: 0.1 %
HCT: 42.2 % (ref 35.0–45.0)
Hemoglobin: 14 g/dL (ref 11.7–15.5)
Lymphs Abs: 2091 cells/uL (ref 850–3900)
MCH: 29.9 pg (ref 27.0–33.0)
MCHC: 33.2 g/dL (ref 32.0–36.0)
MCV: 90 fL (ref 80.0–100.0)
MPV: 10.7 fL (ref 7.5–12.5)
Monocytes Relative: 7.9 %
Neutro Abs: 4230 cells/uL (ref 1500–7800)
Neutrophils Relative %: 61.3 %
Platelets: 236 10*3/uL (ref 140–400)
RBC: 4.69 10*6/uL (ref 3.80–5.10)
RDW: 13.4 % (ref 11.0–15.0)
Total Lymphocyte: 30.3 %
WBC: 6.9 10*3/uL (ref 3.8–10.8)

## 2020-03-16 LAB — COMPLETE METABOLIC PANEL WITH GFR
AG Ratio: 1.7 (calc) (ref 1.0–2.5)
ALT: 14 U/L (ref 6–29)
AST: 16 U/L (ref 10–35)
Albumin: 4.3 g/dL (ref 3.6–5.1)
Alkaline phosphatase (APISO): 86 U/L (ref 37–153)
BUN/Creatinine Ratio: 15 (calc) (ref 6–22)
BUN: 16 mg/dL (ref 7–25)
CO2: 24 mmol/L (ref 20–32)
Calcium: 10.1 mg/dL (ref 8.6–10.4)
Chloride: 103 mmol/L (ref 98–110)
Creat: 1.06 mg/dL — ABNORMAL HIGH (ref 0.50–0.99)
GFR, Est African American: 65 mL/min/{1.73_m2} (ref >=60)
GFR, Est Non African American: 56 mL/min/{1.73_m2} — ABNORMAL LOW (ref >=60)
Globulin: 2.5 g/dL (calc) (ref 1.9–3.7)
Glucose, Bld: 84 mg/dL (ref 65–99)
Potassium: 4.3 mmol/L (ref 3.5–5.3)
Sodium: 139 mmol/L (ref 135–146)
Total Bilirubin: 0.3 mg/dL (ref 0.2–1.2)
Total Protein: 6.8 g/dL (ref 6.1–8.1)

## 2020-03-16 LAB — LDL CHOLESTEROL, DIRECT: Direct LDL: 143 mg/dL — ABNORMAL HIGH (ref <100)

## 2020-03-23 ENCOUNTER — Other Ambulatory Visit: Payer: Self-pay | Admitting: Family Medicine

## 2020-03-23 MED ORDER — ATORVASTATIN CALCIUM 20 MG PO TABS: 20.0000 mg | ORAL_TABLET | Freq: Every day | ORAL | 1 refills | Status: DC

## 2020-07-27 ENCOUNTER — Other Ambulatory Visit: Payer: Self-pay

## 2020-07-27 ENCOUNTER — Ambulatory Visit: Payer: 59 | Admitting: Nurse Practitioner

## 2020-07-27 DIAGNOSIS — M792 Neuralgia and neuritis, unspecified: Secondary | ICD-10-CM | POA: Diagnosis not present

## 2020-07-27 DIAGNOSIS — B029 Zoster without complications: Secondary | ICD-10-CM | POA: Diagnosis not present

## 2020-07-27 MED ORDER — VALACYCLOVIR HCL 1 G PO TABS: 1000.0000 mg | ORAL_TABLET | Freq: Three times a day (TID) | ORAL | 0 refills | Status: AC

## 2020-07-27 MED ORDER — GABAPENTIN 100 MG PO CAPS: 100.0000 mg | ORAL_CAPSULE | Freq: Two times a day (BID) | ORAL | 0 refills | Status: DC

## 2020-07-27 MED ORDER — HYDROCODONE-ACETAMINOPHEN 5-325 MG PO TABS: 1.0000 | ORAL_TABLET | Freq: Four times a day (QID) | ORAL | 0 refills | Status: AC | PRN

## 2020-07-27 NOTE — Progress Notes (Signed)
Established Patient Office Visit  Subjective:  Patient ID: Wendy Crawford, female    DOB: 1956/09/19  Age: 64 y.o. MRN: [id number]  CC: No chief complaint on file.   HPI Wendy Crawford is a 64 year old female presenting with a blistering rash that appeared 1 days ago to the left flank, abdomen to under left breast. She reports feeling itching, burning, pain sensation for about one week prior to the rash appearing. The rash has now clear fluid blisters and the pain, itching, and burning has increase. Heat exacerbates her sxs. No other contacts have similar sxs. She has not been in contact with chemical or known exposures. She has tried tylenol with decrease in pain. She is very uncomfortable. No shingles vaccination.   No fever, chills, cp, ct, gu or gi sxs, edema, palpitations.   Past Medical History:  Diagnosis Date  . Arthritis   . 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 Colitis    ulcerative  . Depression   . Headache(784.0)    most severe for 2 months,cluster  . Headaches, cluster    02 and medication  . Pneumonia YRS AGO  . Primary snoring 06/17/2014  . Psoriasis     Past Surgical History:  Procedure Laterality Date  . BREAST BIOPSY Right 1990  . BREAST EXCISIONAL BIOPSY Right 1994  . JOINT REPLACEMENT    . TOTAL HIP ARTHROPLASTY Left 06/08/2016   Procedure: LEFT TOTAL HIP ARTHROPLASTY ANTERIOR APPROACH;  Surgeon: Mcarthur Rossetti, MD;  Location: [facility];  Service: Orthopedics;  Laterality: Left;  . TOTAL KNEE ARTHROPLASTY Left 11/07/2018   Procedure: LEFT TOTAL KNEE ARTHROPLASTY;  Surgeon: Mcarthur Rossetti, MD;  Location: [facility];  Service: Orthopedics;  Laterality: Left;    Family History  Problem Relation Age of Onset  . Hypertension Mother   . Diabetes Sister   . Diabetes Brother   . Heart disease Maternal Grandmother   . Heart disease Maternal Grandfather   . Colon cancer Neg Hx     Social History   Socioeconomic  History  . Marital status: Married    Spouse name: [name]  . Number of children: 0  . Years of education: 77  . Highest education level: Not on file  Occupational History  . Occupation: self employed  Tobacco Use  . Smoking status: Current Every Day Smoker    Packs/day: 0.50    Years: 42.00    Pack years: 21.00    Types: Cigarettes  . Smokeless tobacco: Never Used  Vaping Use  . Vaping Use: Never used  Substance and Sexual Activity  . Alcohol use: No    Alcohol/week: 0.0 standard drinks  . Drug use: No  . Sexual activity: Not on file  Other Topics Concern  . Not on file  Social History Narrative   Patient is married [name] and lives at home with her husband.   Patient is working full-time.   Patient has a high school education.   Patient is right-handed.   Patient does not drinks any caffeine.   Social Determinants of Health   Financial Resource Strain:   . Difficulty of Paying Living Expenses: Not on file  Food Insecurity:   . Worried About Charity fundraiser in the Last Year: Not on file  . Ran Out of Food in the Last Year: Not on file  Transportation Needs:   . Lack of Transportation (Medical): Not on file  . Lack of Transportation (Non-Medical):  Not on file  Physical Activity:   . Days of Exercise per Week: Not on file  . Minutes of Exercise per Session: Not on file  Stress:   . Feeling of Stress : Not on file  Social Connections:   . Frequency of Communication with Friends and Family: Not on file  . Frequency of Social Gatherings with Friends and Family: Not on file  . Attends Religious Services: Not on file  . Active Member of Clubs or Organizations: Not on file  . Attends Archivist Meetings: Not on file  . Marital Status: Not on file  Intimate Partner Violence:   . Fear of Current or Ex-Partner: Not on file  . Emotionally Abused: Not on file  . Physically Abused: Not on file  . Sexually Abused: Not on file    Outpatient Medications  Prior to Visit  Medication Sig Dispense Refill  . acetaminophen (TYLENOL) 500 MG tablet Take 1,000 mg by mouth 2 (two) times daily as needed for moderate pain or headache.     Marland Kitchen atorvastatin (LIPITOR) 20 MG tablet Take 1 tablet (20 mg total) by mouth daily. 90 tablet 1  . citalopram (CELEXA) 20 MG tablet Take 1 tablet (20 mg total) by mouth daily. 90 tablet 3  . diltiazem (CARDIZEM CD) 180 MG 24 hr capsule TAKE 1 CAPSULE BY MOUTH EVERY DAY 90 capsule 3  . Multiple Vitamins-Minerals (MULTIVITAMIN ADULT) CHEW Chew 2 each by mouth daily.     No facility-administered medications prior to visit.    Allergies  Allergen Reactions  . Sulfa Antibiotics Itching  . Sulfonamide Derivatives Itching    ROS Review of Systems  All other systems reviewed and are negative.     Objective:    Physical Exam Vitals and nursing note reviewed.  Constitutional:      Appearance: Normal appearance.  HENT:     Head: Normocephalic.  Eyes:     Extraocular Movements: Extraocular movements intact.     Conjunctiva/sclera: Conjunctivae normal.     Pupils: Pupils are equal, round, and reactive to light.  Cardiovascular:     Rate and Rhythm: Normal rate.  Pulmonary:     Effort: Pulmonary effort is normal.  Skin:    General: Skin is warm.     Coloration: Skin is not jaundiced or pale.     Findings: Lesion and rash present.          Comments: Two dermatomal region rash with clear vesicular rash as drawn- consistent with Shingles herpes Zoster  Neurological:     General: No focal deficit present.     Mental Status: She is alert and oriented to person, place, and time.  Psychiatric:        Mood and Affect: Mood normal.        Thought Content: Thought content normal.     There were no vitals taken for this visit. Wt Readings from Last 3 Encounters:  03/15/20 250 lb (113.4 kg)  11/07/18 252 lb (114.3 kg)  10/29/18 252 lb (114.3 kg)     Health Maintenance Due  Topic Date Due  . Hepatitis C  Screening  Never done  . COVID-19 Vaccine (1) Never done  . HIV Screening  Never done  . TETANUS/TDAP  Never done  . PAP SMEAR-Modifier  12/09/2017  . MAMMOGRAM  04/21/2020  . INFLUENZA VACCINE  07/03/2020    There are no preventive care reminders to display for this patient.  No results found for: TSH Lab  Results  Component Value Date   WBC 6.9 03/15/2020   HGB 14.0 03/15/2020   HCT 42.2 03/15/2020   MCV 90.0 03/15/2020   PLT 236 03/15/2020   Lab Results  Component Value Date   NA 139 03/15/2020   K 4.3 03/15/2020   CO2 24 03/15/2020   GLUCOSE 84 03/15/2020   BUN 16 03/15/2020   CREATININE 1.06 (H) 03/15/2020   BILITOT 0.3 03/15/2020   ALKPHOS 86 08/29/2010   AST 16 03/15/2020   ALT 14 03/15/2020   PROT 6.8 03/15/2020   ALBUMIN 4.1 08/29/2010   CALCIUM 10.1 03/15/2020   ANIONGAP 8 11/08/2018   No results found for: CHOL No results found for: HDL No results found for: LDLCALC No results found for: TRIG No results found for: CHOLHDL No results found for: HGBA1C    Assessment & Plan:   Problem List Items Addressed This Visit    None    Visit Diagnoses    Herpes zoster without complication    -  Primary   Relevant Medications   valACYclovir (VALTREX) 1000 MG tablet   HYDROcodone-acetaminophen (NORCO/VICODIN) 5-325 MG tablet   gabapentin (NEURONTIN) 100 MG capsule   Pain, neuropathic       Relevant Medications   valACYclovir (VALTREX) 1000 MG tablet   HYDROcodone-acetaminophen (NORCO/VICODIN) 5-325 MG tablet   gabapentin (NEURONTIN) 100 MG capsule     Your sxs of dermatomal vesicular rash, and acute neuritis, which preceded the rash with examination are consistent with Shingles (Herpes Zoster): treatment includes antiviral, sxs control. Take medication as prescribed, avoid heat, avoid contact with others while rash is present.   Meds ordered this encounter  Medications  . valACYclovir (VALTREX) 1000 MG tablet    Sig: Take 1 tablet (1,000 mg total) by  mouth 3 (three) times daily for 10 days.    Dispense:  30 tablet    Refill:  0  . HYDROcodone-acetaminophen (NORCO/VICODIN) 5-325 MG tablet    Sig: Take 1 tablet by mouth every 6 (six) hours as needed for up to 5 days for moderate pain. Take 1 tablet every 6 hours as needed for up to 5 days for moderate pain from shingles outbreak.    Dispense:  20 tablet    Refill:  0  . gabapentin (NEURONTIN) 100 MG capsule    Sig: Take 1 capsule (100 mg total) by mouth 2 (two) times daily. Start with one capsule once a day, may increase to two capsule if needed for shingles neuralgia pain control on day two, may increase to three capsules on day three if needed then continue with three capsules daily. Will wean once pain subsides.    Dispense:  60 capsule    Refill:  0    Follow-up: Return if symptoms worsen or fail to improve.    Annie Main, FNP

## 2020-08-10 ENCOUNTER — Ambulatory Visit: Payer: 59 | Admitting: Nurse Practitioner

## 2020-08-10 ENCOUNTER — Other Ambulatory Visit: Payer: Self-pay

## 2020-08-10 VITALS — BP 118/84 | HR 83 | Temp 97.6°F | Resp 18 | Wt 250.2 lb

## 2020-08-10 DIAGNOSIS — Z09 Encounter for follow-up examination after completed treatment for conditions other than malignant neoplasm: Secondary | ICD-10-CM

## 2020-08-10 DIAGNOSIS — B0229 Other postherpetic nervous system involvement: Secondary | ICD-10-CM | POA: Diagnosis not present

## 2020-08-10 DIAGNOSIS — B029 Zoster without complications: Secondary | ICD-10-CM | POA: Diagnosis not present

## 2020-08-10 NOTE — Progress Notes (Signed)
Established Patient Office Visit  Subjective:  Patient ID: Wendy Crawford, female    DOB: [birth year]  Age: 64 y.o. MRN: [id number]  CC:  Chief Complaint  Patient presents with  . Herpes Zoster    follow up    HPI Wendy Crawford is a 64 year old female presenting for follow up after being diagnoses and treated for Shingles outbreak. She is the caregiver of her young grandkids and stopped caring for them while she has had active lesions. She has continued to have neuralgia taking gabapentin three times a day 100 mg each but reports that her blisters/rash has nearly resolves and would like examination to determine if she is consider contagious anymore. She would like to resume the care of her grandkids.   Past Medical History:  Diagnosis Date  . Arthritis   . 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 Colitis    ulcerative  . Depression   . Headache(784.0)    most severe for 2 months,cluster  . Headaches, cluster    02 and medication  . Pneumonia YRS AGO  . Primary snoring 06/17/2014  . Psoriasis     Past Surgical History:  Procedure Laterality Date  . BREAST BIOPSY Right 1990  . BREAST EXCISIONAL BIOPSY Right 1994  . JOINT REPLACEMENT    . TOTAL HIP ARTHROPLASTY Left 06/08/2016   Procedure: LEFT TOTAL HIP ARTHROPLASTY ANTERIOR APPROACH;  Surgeon: Mcarthur Rossetti, MD;  Location: [facility];  Service: Orthopedics;  Laterality: Left;  . TOTAL KNEE ARTHROPLASTY Left 11/07/2018   Procedure: LEFT TOTAL KNEE ARTHROPLASTY;  Surgeon: Mcarthur Rossetti, MD;  Location: [facility];  Service: Orthopedics;  Laterality: Left;    Family History  Problem Relation Age of Onset  . Hypertension Mother   . Diabetes Sister   . Diabetes Brother   . Heart disease Maternal Grandmother   . Heart disease Maternal Grandfather   . Colon cancer Neg Hx     Social History   Socioeconomic History  . Marital status: Married    Spouse name: [name]  . Number of  children: 0  . Years of education: 65  . Highest education level: Not on file  Occupational History  . Occupation: self employed  Tobacco Use  . Smoking status: Current Every Day Smoker    Packs/day: 0.50    Years: 42.00    Pack years: 21.00    Types: Cigarettes  . Smokeless tobacco: Never Used  Vaping Use  . Vaping Use: Never used  Substance and Sexual Activity  . Alcohol use: No    Alcohol/week: 0.0 standard drinks  . Drug use: No  . Sexual activity: Not on file  Other Topics Concern  . Not on file  Social History Narrative   Patient is married [name] and lives at home with her husband.   Patient is working full-time.   Patient has a high school education.   Patient is right-handed.   Patient does not drinks any caffeine.   Social Determinants of Health   Financial Resource Strain:   . Difficulty of Paying Living Expenses: Not on file  Food Insecurity:   . Worried About Charity fundraiser in the Last Year: Not on file  . Ran Out of Food in the Last Year: Not on file  Transportation Needs:   . Lack of Transportation (Medical): Not on file  . Lack of Transportation (Non-Medical): Not on file  Physical Activity:   . Days of  Exercise per Week: Not on file  . Minutes of Exercise per Session: Not on file  Stress:   . Feeling of Stress : Not on file  Social Connections:   . Frequency of Communication with Friends and Family: Not on file  . Frequency of Social Gatherings with Friends and Family: Not on file  . Attends Religious Services: Not on file  . Active Member of Clubs or Organizations: Not on file  . Attends Archivist Meetings: Not on file  . Marital Status: Not on file  Intimate Partner Violence:   . Fear of Current or Ex-Partner: Not on file  . Emotionally Abused: Not on file  . Physically Abused: Not on file  . Sexually Abused: Not on file    Outpatient Medications Prior to Visit  Medication Sig Dispense Refill  . acetaminophen (TYLENOL)  500 MG tablet Take 1,000 mg by mouth 2 (two) times daily as needed for moderate pain or headache.     Marland Kitchen atorvastatin (LIPITOR) 20 MG tablet Take 1 tablet (20 mg total) by mouth daily. 90 tablet 1  . citalopram (CELEXA) 20 MG tablet Take 1 tablet (20 mg total) by mouth daily. 90 tablet 3  . diltiazem (CARDIZEM CD) 180 MG 24 hr capsule TAKE 1 CAPSULE BY MOUTH EVERY DAY 90 capsule 3  . gabapentin (NEURONTIN) 100 MG capsule Take 1 capsule (100 mg total) by mouth 2 (two) times daily. Start with one capsule once a day, may increase to two capsule if needed for shingles neuralgia pain control on day two, may increase to three capsules on day three if needed then continue with three capsules daily. Will wean once pain subsides. 60 capsule 0  . Multiple Vitamins-Minerals (MULTIVITAMIN ADULT) CHEW Chew 2 each by mouth daily.     No facility-administered medications prior to visit.    Allergies  Allergen Reactions  . Sulfa Antibiotics Itching  . Sulfonamide Derivatives Itching    ROS Review of Systems  All other systems reviewed and are negative.     Objective:    Physical Exam Vitals and nursing note reviewed.  Constitutional:      Appearance: Normal appearance.  HENT:     Head: Normocephalic and atraumatic.  Eyes:     Extraocular Movements: Extraocular movements intact.     Pupils: Pupils are equal, round, and reactive to light.  Cardiovascular:     Rate and Rhythm: Normal rate.  Pulmonary:     Effort: Pulmonary effort is normal.  Musculoskeletal:     Cervical back: Normal range of motion and neck supple.  Neurological:     General: No focal deficit present.     Mental Status: She is alert and oriented to person, place, and time.  Psychiatric:        Mood and Affect: Mood normal.        Thought Content: Thought content normal.        Judgment: Judgment normal.     BP 118/84 (BP Location: Left Arm, Patient Position: Sitting, Cuff Size: Large)   Pulse 83   Temp 97.6 F (36.4  C) (Temporal)   Resp 18   Wt 250 lb 3.2 oz (113.5 kg)   SpO2 93%   BMI 47.27 kg/m  Wt Readings from Last 3 Encounters:  08/10/20 250 lb 3.2 oz (113.5 kg)  03/15/20 250 lb (113.4 kg)  11/07/18 252 lb (114.3 kg)     Health Maintenance Due  Topic Date Due  . Hepatitis C Screening  Never done  . COVID-19 Vaccine (1) Never done  . HIV Screening  Never done  . TETANUS/TDAP  Never done  . PAP SMEAR-Modifier  12/09/2017  . MAMMOGRAM  04/21/2020  . INFLUENZA VACCINE  07/03/2020    There are no preventive care reminders to display for this patient.  No results found for: TSH Lab Results  Component Value Date   WBC 6.9 03/15/2020   HGB 14.0 03/15/2020   HCT 42.2 03/15/2020   MCV 90.0 03/15/2020   PLT 236 03/15/2020   Lab Results  Component Value Date   NA 139 03/15/2020   K 4.3 03/15/2020   CO2 24 03/15/2020   GLUCOSE 84 03/15/2020   BUN 16 03/15/2020   CREATININE 1.06 (H) 03/15/2020   BILITOT 0.3 03/15/2020   ALKPHOS 86 08/29/2010   AST 16 03/15/2020   ALT 14 03/15/2020   PROT 6.8 03/15/2020   ALBUMIN 4.1 08/29/2010   CALCIUM 10.1 03/15/2020   ANIONGAP 8 11/08/2018   No results found for: CHOL No results found for: HDL No results found for: LDLCALC No results found for: TRIG No results found for: CHOLHDL No results found for: HGBA1C    Assessment & Plan:   Problem List Items Addressed This Visit    None    Visit Diagnoses    Herpes zoster without complication    -  Primary   Follow-up examination       Post herpetic neuralgia         Your Herpes Zoster lesions have resolved.  PHN: continue medication Gabapentin as directed.   No orders of the defined types were placed in this encounter.   Follow-up: Return if symptoms worsen or fail to improve.    Annie Main, FNP

## 2020-09-19 ENCOUNTER — Other Ambulatory Visit: Payer: Self-pay | Admitting: Family Medicine

## 2021-01-30 ENCOUNTER — Other Ambulatory Visit: Payer: Self-pay | Admitting: Family Medicine

## 2021-01-30 DIAGNOSIS — Z1231 Encounter for screening mammogram for malignant neoplasm of breast: Secondary | ICD-10-CM

## 2021-03-14 ENCOUNTER — Ambulatory Visit (INDEPENDENT_AMBULATORY_CARE_PROVIDER_SITE_OTHER): Payer: 59

## 2021-03-14 ENCOUNTER — Ambulatory Visit (INDEPENDENT_AMBULATORY_CARE_PROVIDER_SITE_OTHER): Payer: 59 | Admitting: Orthopaedic Surgery

## 2021-03-14 ENCOUNTER — Ambulatory Visit: Payer: Self-pay

## 2021-03-14 DIAGNOSIS — M25511 Pain in right shoulder: Secondary | ICD-10-CM | POA: Diagnosis not present

## 2021-03-14 DIAGNOSIS — G8929 Other chronic pain: Secondary | ICD-10-CM | POA: Diagnosis not present

## 2021-03-14 DIAGNOSIS — M25512 Pain in left shoulder: Secondary | ICD-10-CM | POA: Diagnosis not present

## 2021-03-14 MED ORDER — METHYLPREDNISOLONE ACETATE 40 MG/ML IJ SUSP
40.0000 mg | INTRAMUSCULAR | Status: AC | PRN
  Administered 2021-03-14: 40 mg via INTRA_ARTICULAR

## 2021-03-14 MED ORDER — LIDOCAINE HCL 1 % IJ SOLN
3.0000 mL | INTRAMUSCULAR | Status: AC | PRN
  Administered 2021-03-14: 3 mL

## 2021-03-14 NOTE — Progress Notes (Signed)
Office Visit Note   Patient: Wendy Crawford           Date of Birth: [birth year]           MRN: [id number] Visit Date: 03/14/2021              Requested by: [name], MD [address] [place],  [place] [address] PCP: [name], MD   Assessment & Plan: Visit Diagnoses:  1. Chronic left shoulder pain   2. Chronic right shoulder pain     Plan: I did talk to her about the glenohumeral arthritis she has in both shoulders.  I did place a steroid injection in both subacromial space because of pain this will help her more so on the right than left.  I would like her to get a follow-up appoint with Dr. Junius Roads which she is actually seen remotely.  I would like him to try an intra-articular steroid injection in the left shoulder under ultrasound.  She agrees with this treatment plan.  All questions and concerns were answered addressed.  After Dr. Junius Roads sees her follow-up can be as needed.  Follow-Up Instructions: No follow-ups on file.   Orders:  Orders Placed This Encounter  Procedures  . Large Joint Inj  . Large Joint Inj  . XR Shoulder Left  . XR Shoulder Right   No orders of the defined types were placed in this encounter.     Procedures: Large Joint Inj: R subacromial bursa on 03/14/2021 9:45 AM Indications: pain and diagnostic evaluation Details: 22 G 1.5 in needle  Arthrogram: No  Medications: 3 mL lidocaine 1 %; 40 mg methylPREDNISolone acetate 40 MG/ML Outcome: tolerated well, no immediate complications Procedure, treatment alternatives, risks and benefits explained, specific risks discussed. Consent was given by the patient. Immediately prior to procedure a time out was called to verify the correct patient, procedure, equipment, support staff and site/side marked as required. Patient was prepped and draped in the usual sterile fashion.   Large Joint Inj: L subacromial bursa on 03/14/2021 9:46 AM Indications: pain and diagnostic evaluation Details: 22  G 1.5 in needle  Arthrogram: No  Medications: 3 mL lidocaine 1 %; 40 mg methylPREDNISolone acetate 40 MG/ML Outcome: tolerated well, no immediate complications Procedure, treatment alternatives, risks and benefits explained, specific risks discussed. Consent was given by the patient. Immediately prior to procedure a time out was called to verify the correct patient, procedure, equipment, support staff and site/side marked as required. Patient was prepped and draped in the usual sterile fashion.       Clinical Data: No additional findings.   Subjective: Chief Complaint  Patient presents with  . Left Shoulder - Pain  . Right Shoulder - Pain  The patient is someone was seen before.  She has chronic bilateral shoulder pain and this the first time she is being evaluated for that.  She has dealt with arthritis in the hip and knee before.  She said the pain is mainly in the anterior aspect of her left shoulder and hurts with her activities but she also hurt to the right shoulder with reaching overhead.  She is not diabetic.  She is someone who is morbidly obese.  She is never injured her shoulders before and does do a lot of activities taking care of her mother with dementia.  She has had some chronic neck pain but no numbness and tingling in her hands or weakness in her arms.  Most  of her pain is overhead and reaching behind her with both arms.  HPI  Review of Systems She currently denies any headache, chest pain, shortness of breath, fever, chills, nausea, vomiting  Objective: Vital Signs: There were no vitals taken for this visit.  Physical Exam She is alert and orient x3 and in no acute distress Ortho Exam Examination of both shoulder shows some slight grinding of the glenohumeral joint.  Her right shoulder motion is much better than the left motion which is more difficult with external rotation and reaching behind her as well as overhead.  Gross of the rotator cuff appears to be  intact. Specialty Comments:  No specialty comments available.  Imaging: XR Shoulder Left  Result Date: 03/14/2021 3 views of the left shoulder show moderate glenohumeral arthritis.  XR Shoulder Right  Result Date: 03/14/2021 3 views of the right shoulder all show moderate glenohumeral arthritis.  There is also significant AC joint arthritis.    PMFS History: Patient Active Problem List   Diagnosis Date Noted  . Status post total left knee replacement 11/07/2018  . Primary osteoarthritis of both knees 03/26/2018  . Chronic pain of both knees 07/01/2017  . Unilateral primary osteoarthritis, left knee 07/01/2017  . Unilateral primary osteoarthritis, right knee 07/01/2017  . Osteoarthritis of left hip 06/08/2016  . Status post total replacement of left hip 06/08/2016  . Chronic cluster headache, not intractable 09/13/2015  . COPD exacerbation ([place]) 09/13/2015  . Primary snoring 06/17/2014  . 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 Episodic cluster headache   . RECTAL BLEEDING 12/02/2007  . DIARRHEA 12/02/2007  . ABDOMINAL PAIN RIGHT LOWER QUADRANT 12/02/2007  . COLITIS, ULCERATIVE 06/23/2007   Past Medical History:  Diagnosis Date  . Arthritis   . 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 Colitis    ulcerative  . Depression   . Headache(784.0)    most severe for 2 months,cluster  . Headaches, cluster    02 and medication  . Pneumonia YRS AGO  . Primary snoring 06/17/2014  . Psoriasis     Family History  Problem Relation Age of Onset  . Hypertension Mother   . Diabetes Sister   . Diabetes Brother   . Heart disease Maternal Grandmother   . Heart disease Maternal Grandfather   . Colon cancer Neg Hx     Past Surgical History:  Procedure Laterality Date  . BREAST BIOPSY Right 1990  . BREAST EXCISIONAL BIOPSY Right 1994  . JOINT REPLACEMENT    . TOTAL HIP ARTHROPLASTY Left 06/08/2016   Procedure: LEFT  TOTAL HIP ARTHROPLASTY ANTERIOR APPROACH;  Surgeon: Mcarthur Rossetti, MD;  Location: [facility];  Service: Orthopedics;  Laterality: Left;  . TOTAL KNEE ARTHROPLASTY Left 11/07/2018   Procedure: LEFT TOTAL KNEE ARTHROPLASTY;  Surgeon: Mcarthur Rossetti, MD;  Location: [facility];  Service: Orthopedics;  Laterality: Left;   Social History   Occupational History  . Occupation: self employed  Tobacco Use  . Smoking status: Current Every Day Smoker    Packs/day: 0.50    Years: 42.00    Pack years: 21.00    Types: Cigarettes  . Smokeless tobacco: Never Used  Vaping Use  . Vaping Use: Never used  Substance and Sexual Activity  . Alcohol use: No    Alcohol/week: 0.0 standard drinks  . Drug use: No  . Sexual activity: Not on file

## 2021-03-21 ENCOUNTER — Other Ambulatory Visit: Payer: Self-pay

## 2021-03-21 ENCOUNTER — Encounter: Payer: Self-pay | Admitting: Family Medicine

## 2021-03-21 ENCOUNTER — Ambulatory Visit: Payer: Self-pay

## 2021-03-21 ENCOUNTER — Ambulatory Visit (INDEPENDENT_AMBULATORY_CARE_PROVIDER_SITE_OTHER): Payer: 59 | Admitting: Family Medicine

## 2021-03-21 DIAGNOSIS — M25512 Pain in left shoulder: Secondary | ICD-10-CM

## 2021-03-21 DIAGNOSIS — G8929 Other chronic pain: Secondary | ICD-10-CM

## 2021-03-21 NOTE — Progress Notes (Signed)
Subjective: Patient is here for ultrasound-guided intra-articular left glenohumeral injection.  Minimal relief from subacromial injection.  Objective:  Pain and limited ROM with overhead reach.  Procedure: Ultrasound guided injection is preferred based studies that show increased duration, increased effect, greater accuracy, decreased procedural pain, increased response rate, and decreased cost with ultrasound guided versus blind injection.   Verbal informed consent obtained.  Time-out conducted.  Noted no overlying erythema, induration, or other signs of local infection. Ultrasound-guided left glenohumeral injection: After sterile prep with Betadine, injected 4 cc 0.25% bupivocaine without epinephrine and 6 mg betamethasone using a 22-gauge spinal needle, passing the needle from posterior approach into the glenohumeral joint.  Injectate seen filling joint capsule.  Modest immediate improvement.

## 2021-03-22 ENCOUNTER — Ambulatory Visit
Admission: RE | Admit: 2021-03-22 | Discharge: 2021-03-22 | Disposition: A | Discharge status: Home / Self Care | Payer: 59 | Source: Ambulatory Visit | Attending: Family Medicine | Admitting: Family Medicine

## 2021-03-22 ENCOUNTER — Other Ambulatory Visit: Payer: Self-pay | Admitting: Family Medicine

## 2021-03-22 DIAGNOSIS — Z1231 Encounter for screening mammogram for malignant neoplasm of breast: Secondary | ICD-10-CM

## 2021-03-22 DIAGNOSIS — G441 Vascular headache, not elsewhere classified: Secondary | ICD-10-CM

## 2021-03-22 DIAGNOSIS — Z720 Tobacco use: Secondary | ICD-10-CM

## 2021-03-30 ENCOUNTER — Other Ambulatory Visit: Payer: Self-pay | Admitting: *Deleted

## 2021-03-30 MED ORDER — ATORVASTATIN CALCIUM 20 MG PO TABS: 1.0000 | ORAL_TABLET | Freq: Every day | ORAL | 0 refills | Status: DC

## 2021-03-30 MED ORDER — DILTIAZEM HCL ER COATED BEADS 180 MG PO CP24: ORAL_CAPSULE | ORAL | 0 refills | Status: DC

## 2021-04-11 ENCOUNTER — Ambulatory Visit (INDEPENDENT_AMBULATORY_CARE_PROVIDER_SITE_OTHER): Payer: 59 | Admitting: Family Medicine

## 2021-04-11 ENCOUNTER — Other Ambulatory Visit: Payer: Self-pay

## 2021-04-11 ENCOUNTER — Encounter: Payer: Self-pay | Admitting: Family Medicine

## 2021-04-11 VITALS — BP 140/78 | HR 80 | Temp 97.2°F | Ht 61.0 in | Wt 256.8 lb

## 2021-04-11 DIAGNOSIS — E7800 Pure hypercholesterolemia, unspecified: Secondary | ICD-10-CM

## 2021-04-11 MED ORDER — TRAMADOL HCL 50 MG PO TABS: 50.0000 mg | ORAL_TABLET | Freq: Four times a day (QID) | ORAL | 0 refills | Status: AC | PRN

## 2021-04-11 NOTE — Progress Notes (Signed)
Subjective:    Patient ID: Wendy Crawford, female    DOB: 06/13/1956, 65 y.o.   MRN: [id number]  Patient is here today for a checkup.  She denies any concerns.  She schedule her mammogram and her Pap smear and this was performed at her gynecologist yesterday.  She is not due for colonoscopy until 2024.  She denies any chest pain shortness of breath or dyspnea on exertion.  She is due for lung cancer screening and we discussed this however she prefers to not do that at the present time.  Her blood pressure is adequately controlled today however she is mainly taken diltiazem for cluster headaches.  From that standpoint she is doing relatively well.  She is complaining of pain in her shoulders.  She has seen orthopedics and they have performed joint injections on several occasions with no relief.  At times it aches and throbs and keeps her awake at night.  We discussed possibly using a low-dose pain medication at night to help her sleep and she is interested in that. Past Medical History:  Diagnosis Date  . Arthritis   . 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 Colitis    ulcerative  . Depression   . Headache(784.0)    most severe for 2 months,cluster  . Headaches, cluster    02 and medication  . Pneumonia YRS AGO  . Primary snoring 06/17/2014  . Psoriasis    Past Surgical History:  Procedure Laterality Date  . BREAST BIOPSY Right 1990  . BREAST EXCISIONAL BIOPSY Right 1994  . JOINT REPLACEMENT    . TOTAL HIP ARTHROPLASTY Left 06/08/2016   Procedure: LEFT TOTAL HIP ARTHROPLASTY ANTERIOR APPROACH;  Surgeon: Mcarthur Rossetti, MD;  Location: [facility];  Service: Orthopedics;  Laterality: Left;  . TOTAL KNEE ARTHROPLASTY Left 11/07/2018   Procedure: LEFT TOTAL KNEE ARTHROPLASTY;  Surgeon: Mcarthur Rossetti, MD;  Location: [facility];  Service: Orthopedics;  Laterality: Left;   Current Outpatient Medications on File Prior to Visit  Medication Sig Dispense  Refill  . acetaminophen (TYLENOL) 500 MG tablet Take 1,000 mg by mouth 2 (two) times daily as needed for moderate pain or headache.     Marland Kitchen atorvastatin (LIPITOR) 20 MG tablet Take 1 tablet (20 mg total) by mouth daily. 30 tablet 0  . citalopram (CELEXA) 20 MG tablet TAKE 1 TABLET BY MOUTH EVERY DAY 30 tablet 0  . diltiazem (CARDIZEM CD) 180 MG 24 hr capsule TAKE 1 CAPSULE BY MOUTH EVERY DAY 30 capsule 0  . Multiple Vitamins-Minerals (MULTIVITAMIN ADULT) CHEW Chew 2 each by mouth daily.     No current facility-administered medications on file prior to visit.   Allergies  Allergen Reactions  . Sulfa Antibiotics Itching  . Sulfonamide Derivatives Itching   Social History   Socioeconomic History  . Marital status: Married    Spouse name: [name]  . Number of children: 0  . Years of education: 70  . Highest education level: Not on file  Occupational History  . Occupation: self employed  Tobacco Use  . Smoking status: Current Every Day Smoker    Packs/day: 0.50    Years: 42.00    Pack years: 21.00    Types: Cigarettes  . Smokeless tobacco: Never Used  Vaping Use  . Vaping Use: Never used  Substance and Sexual Activity  . Alcohol use: No    Alcohol/week: 0.0 standard drinks  . Drug use: No  . Sexual activity: Not  on file  Other Topics Concern  . Not on file  Social History Narrative   Patient is married [name] and lives at home with her husband.   Patient is working full-time.   Patient has a high school education.   Patient is right-handed.   Patient does not drinks any caffeine.   Social Determinants of Health   Financial Resource Strain: Not on file  Food Insecurity: Not on file  Transportation Needs: Not on file  Physical Activity: Not on file  Stress: Not on file  Social Connections: Not on file  Intimate Partner Violence: Not on file      Review of Systems  All other systems reviewed and are negative.      Objective:   Physical Exam Vitals reviewed.   Constitutional:      Appearance: She is well-developed.  HENT:     Right Ear: Tympanic membrane and ear canal normal.     Mouth/Throat:     Pharynx: No oropharyngeal exudate.  Cardiovascular:     Rate and Rhythm: Normal rate and regular rhythm.     Heart sounds: Normal heart sounds.  Pulmonary:     Effort: Pulmonary effort is normal. No respiratory distress.     Breath sounds: Wheezing present. No rales.  Musculoskeletal:     Cervical back: Neck supple.  Lymphadenopathy:     Cervical: No cervical adenopathy.           Assessment & Plan:  Pure hypercholesterolemia - Plan: CBC with Differential/Platelet, COMPLETE METABOLIC PANEL WITH GFR, Lipid panel Recommended a CT scan of the lungs to screen for lung cancer but she defers at the present time.  Pap smear and mammogram have just been done yesterday.  Colonoscopy is up-to-date.  Recommend smoking cessation however also recommended lung cancer screening.  Blood pressure is excellent.  I will check a CBC, CMP, and fasting lipid panel. Immunization History  Administered Date(s) Administered  . Influenza,inj,Quad PF,6+ Mos 09/20/2018, 07/28/2019  . Moderna Sars-Covid-2 Vaccination 02/20/2020, 03/07/2020, 03/19/2020

## 2021-04-12 LAB — CBC WITH DIFFERENTIAL/PLATELET
Absolute Monocytes: 378 cells/uL (ref 200–950)
Basophils Absolute: 38 cells/uL (ref 0–200)
Basophils Relative: 0.9 %
Eosinophils Absolute: 88 cells/uL (ref 15–500)
Eosinophils Relative: 2.1 %
HCT: 41.5 % (ref 35.0–45.0)
Hemoglobin: 13.5 g/dL (ref 11.7–15.5)
Lymphs Abs: 1600 cells/uL (ref 850–3900)
MCH: 29.3 pg (ref 27.0–33.0)
MCHC: 32.5 g/dL (ref 32.0–36.0)
MCV: 90 fL (ref 80.0–100.0)
MPV: 10.5 fL (ref 7.5–12.5)
Monocytes Relative: 9 %
Neutro Abs: 2096 cells/uL (ref 1500–7800)
Neutrophils Relative %: 49.9 %
Platelets: 242 10*3/uL (ref 140–400)
RBC: 4.61 10*6/uL (ref 3.80–5.10)
RDW: 13 % (ref 11.0–15.0)
Total Lymphocyte: 38.1 %
WBC: 4.2 10*3/uL (ref 3.8–10.8)

## 2021-04-12 LAB — COMPLETE METABOLIC PANEL WITH GFR
AG Ratio: 1.8 (calc) (ref 1.0–2.5)
ALT: 11 U/L (ref 6–29)
AST: 14 U/L (ref 10–35)
Albumin: 4.3 g/dL (ref 3.6–5.1)
Alkaline phosphatase (APISO): 94 U/L (ref 37–153)
BUN: 16 mg/dL (ref 7–25)
CO2: 27 mmol/L (ref 20–32)
Calcium: 9.6 mg/dL (ref 8.6–10.4)
Chloride: 105 mmol/L (ref 98–110)
Creat: 0.87 mg/dL (ref 0.50–0.99)
GFR, Est African American: 82 mL/min/{1.73_m2} (ref >=60)
GFR, Est Non African American: 70 mL/min/{1.73_m2} (ref >=60)
Globulin: 2.4 g/dL (calc) (ref 1.9–3.7)
Glucose, Bld: 85 mg/dL (ref 65–99)
Potassium: 4.8 mmol/L (ref 3.5–5.3)
Sodium: 141 mmol/L (ref 135–146)
Total Bilirubin: 0.3 mg/dL (ref 0.2–1.2)
Total Protein: 6.7 g/dL (ref 6.1–8.1)

## 2021-04-12 LAB — LIPID PANEL
Cholesterol: 198 mg/dL (ref <200)
HDL: 64 mg/dL (ref >=50)
LDL Cholesterol (Calc): 106 mg/dL (calc) — ABNORMAL HIGH
Non-HDL Cholesterol (Calc): 134 mg/dL (calc) — ABNORMAL HIGH (ref <130)
Total CHOL/HDL Ratio: 3.1 (calc) (ref <5.0)
Triglycerides: 166 mg/dL — ABNORMAL HIGH (ref <150)

## 2021-04-13 ENCOUNTER — Encounter: Payer: Self-pay | Admitting: *Deleted

## 2021-04-24 ENCOUNTER — Other Ambulatory Visit: Payer: Self-pay | Admitting: Family Medicine

## 2021-04-24 DIAGNOSIS — Z720 Tobacco use: Secondary | ICD-10-CM

## 2021-04-24 DIAGNOSIS — G441 Vascular headache, not elsewhere classified: Secondary | ICD-10-CM

## 2021-05-18 ENCOUNTER — Other Ambulatory Visit: Payer: Self-pay | Admitting: Family Medicine

## 2021-05-18 DIAGNOSIS — G441 Vascular headache, not elsewhere classified: Secondary | ICD-10-CM

## 2021-05-18 DIAGNOSIS — Z720 Tobacco use: Secondary | ICD-10-CM

## 2021-05-23 ENCOUNTER — Other Ambulatory Visit: Payer: Self-pay

## 2021-05-23 MED ORDER — DILTIAZEM HCL ER COATED BEADS 180 MG PO CP24: 180.0000 mg | ORAL_CAPSULE | Freq: Every day | ORAL | 1 refills | Status: DC

## 2021-07-20 ENCOUNTER — Other Ambulatory Visit: Payer: Self-pay | Admitting: Family Medicine

## 2021-08-19 ENCOUNTER — Other Ambulatory Visit: Payer: Self-pay | Admitting: Family Medicine

## 2021-08-20 ENCOUNTER — Other Ambulatory Visit: Payer: Self-pay | Admitting: Family Medicine

## 2021-09-14 ENCOUNTER — Other Ambulatory Visit: Payer: Self-pay | Admitting: Family Medicine

## 2021-10-23 ENCOUNTER — Other Ambulatory Visit: Payer: Self-pay | Admitting: Family Medicine

## 2021-11-17 ENCOUNTER — Other Ambulatory Visit: Payer: Self-pay | Admitting: Family Medicine

## 2021-11-17 DIAGNOSIS — G441 Vascular headache, not elsewhere classified: Secondary | ICD-10-CM

## 2021-11-17 DIAGNOSIS — Z720 Tobacco use: Secondary | ICD-10-CM

## 2021-11-22 ENCOUNTER — Other Ambulatory Visit: Payer: Self-pay | Admitting: Family Medicine

## 2021-11-22 ENCOUNTER — Telehealth: Payer: Self-pay | Admitting: Family Medicine

## 2021-11-22 NOTE — Telephone Encounter (Signed)
Received call from patient's daughter [name] to report patient exposed to [place]. Requesting call to patient from nurse or provider for help with managing symptoms.    Please advise patient on cell phone at [phone].

## 2021-11-22 NOTE — Telephone Encounter (Signed)
Pt scheduled OV for tomorrow.

## 2021-11-23 ENCOUNTER — Ambulatory Visit (INDEPENDENT_AMBULATORY_CARE_PROVIDER_SITE_OTHER): Payer: 59 | Admitting: Nurse Practitioner

## 2021-11-23 ENCOUNTER — Encounter: Payer: Self-pay | Admitting: Nurse Practitioner

## 2021-11-23 VITALS — BP 128/64 | HR 79 | Temp 98.6°F

## 2021-11-23 DIAGNOSIS — U071 COVID-19: Secondary | ICD-10-CM | POA: Diagnosis not present

## 2021-11-23 MED ORDER — MOLNUPIRAVIR EUA 200MG CAPSULE: 4.0000 | ORAL_CAPSULE | Freq: Two times a day (BID) | ORAL | 0 refills | Status: AC

## 2021-11-23 NOTE — Progress Notes (Signed)
Subjective:    Patient ID: Wendy Crawford, female    DOB: 06/19/1956, 65 y.o.   MRN: [id number]  HPI: Wendy Crawford is a 65 y.o. female presenting for COVID-19.  Chief Complaint  Patient presents with   Covid Positive   UPPER RESPIRATORY TRACT INFECTION Onset: yesterday; tested positive for COVID-19 today Fever: no Body aches: yes Chills: yes Cough:yes; dry Shortness of breath: no Wheezing: no Chest pain: yes, with cough Chest tightness: no Chest congestion: no Nasal congestion: yes Runny nose: yes Post nasal drip: yes Sneezing: no Sore throat: no Swollen glands: no Sinus pressure: no Headache: yes Face pain: no Toothache: no Ear pain: no  Ear pressure: yes; bilateral ears  Eyes red/itching:no Eye drainage/crusting: no  Nausea: yes  Vomiting: no Diarrhea: yes  Change in appetite:  yes; decreased   Loss of taste/smell: no  Rash: no Fatigue: yes Sick contacts: yes; mother currently in hospital and sister also tested positive  Strep contacts: no  Context: stable Recurrent sinusitis: no Treatments attempted: Tylenol, vitamins Relief with OTC medications: yes  Allergies  Allergen Reactions   Sulfa Antibiotics Itching   Sulfonamide Derivatives Itching    Outpatient Encounter Medications as of 11/23/2021  Medication Sig   molnupiravir EUA (LAGEVRIO) 200 mg CAPS capsule Take 4 capsules (800 mg total) by mouth 2 (two) times daily for 5 days.   acetaminophen (TYLENOL) 500 MG tablet Take 1,000 mg by mouth 2 (two) times daily as needed for moderate pain or headache.    atorvastatin (LIPITOR) 20 MG tablet TAKE 1 TABLET BY MOUTH EVERY DAY   citalopram (CELEXA) 20 MG tablet TAKE 1 TABLET BY MOUTH EVERY DAY   diltiazem (CARDIZEM CD) 180 MG 24 hr capsule TAKE 1 CAPSULE BY MOUTH EVERY DAY   Multiple Vitamins-Minerals (MULTIVITAMIN ADULT) CHEW Chew 2 each by mouth daily.   No facility-administered encounter medications on file as of 11/23/2021.    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 Interim medical history since our last visit reviewed.  Review of Systems Per HPI unless specifically indicated above     Objective:    There were no vitals taken for this visit.  Wt Readings from Last 3 Encounters:  04/11/21 256 lb 12.8 oz (116.5 kg)  08/10/20 250 lb 3.2 oz (113.5 kg)  03/15/20 250 lb (113.4 kg)    Physical Exam Vitals and nursing note reviewed.  Constitutional:      General: She is not in acute distress.    Appearance: Normal appearance. She is obese. She is not toxic-appearing.  HENT:     Head: Normocephalic and atraumatic.     Right Ear: Ear canal and external ear normal. A middle ear effusion is present.     Left Ear: Ear canal and external ear normal. A middle ear effusion is present.     Nose:  Nose normal. No congestion.     Mouth/Throat:     Mouth: Mucous membranes are moist.     Pharynx: Oropharynx is clear. Posterior oropharyngeal erythema present.  Eyes:     General: No scleral icterus.    Extraocular Movements: Extraocular movements intact.  Cardiovascular:     Rate and Rhythm: Normal rate and regular rhythm.     Heart sounds: Normal heart sounds. No murmur heard. Pulmonary:     Effort: Pulmonary effort is normal. No respiratory distress.     Breath sounds: Normal breath sounds. No wheezing, rhonchi or rales.  Skin:    General: Skin is warm and dry.     Capillary Refill: Capillary refill takes less than 2 seconds.     Coloration: Skin is not jaundiced or pale.     Findings: No erythema.  Neurological:     Mental Status: She is alert and oriented to person, place, and time.     Motor: No weakness.     Gait: Gait normal.       Assessment & Plan:  1. COVID-19 Acute.  Symptom onset yesterday.  Given smoking history and comorbidities, start molnupiravir twice daily for 5 days.  Continue symptomatic treatment.  Can start saline nasal spray to help with congestion, push fluids, throat lozenges and cough drops.  Discussed severe symptoms and to seek emergent care with chest pain or shortness of breath.  Discussed isolation period.    - molnupiravir EUA (LAGEVRIO) 200 mg CAPS capsule; Take 4 capsules (800 mg total) by mouth 2 (two) times daily for 5 days.  Dispense: 40 capsule; Refill: 0   Follow up plan: Return if symptoms worsen or fail to improve.

## 2022-02-14 ENCOUNTER — Other Ambulatory Visit: Payer: Self-pay | Admitting: Family Medicine

## 2022-02-22 ENCOUNTER — Other Ambulatory Visit: Payer: Self-pay

## 2022-02-22 ENCOUNTER — Ambulatory Visit (INDEPENDENT_AMBULATORY_CARE_PROVIDER_SITE_OTHER): Payer: Medicare Other

## 2022-02-22 ENCOUNTER — Encounter: Payer: Self-pay | Admitting: Physician Assistant

## 2022-02-22 ENCOUNTER — Telehealth: Payer: Self-pay | Admitting: Orthopaedic Surgery

## 2022-02-22 ENCOUNTER — Ambulatory Visit: Payer: Medicare Other | Admitting: Physician Assistant

## 2022-02-22 ENCOUNTER — Telehealth: Payer: Self-pay

## 2022-02-22 DIAGNOSIS — G8929 Other chronic pain: Secondary | ICD-10-CM

## 2022-02-22 DIAGNOSIS — M1711 Unilateral primary osteoarthritis, right knee: Secondary | ICD-10-CM | POA: Diagnosis not present

## 2022-02-22 DIAGNOSIS — M25561 Pain in right knee: Secondary | ICD-10-CM

## 2022-02-22 NOTE — Telephone Encounter (Signed)
Lvm for pt to return my call ? ?Due to pt's insurance pt is unable to have knee replacement until BMI is under 40. Can come back for an injection per Artis Delay until gets weight off  ?

## 2022-02-22 NOTE — Telephone Encounter (Signed)
Called.

## 2022-02-22 NOTE — Telephone Encounter (Signed)
Pt returned call to Autumn H. Pt asking for a return call. Phone number is [phone]. ?

## 2022-02-22 NOTE — Progress Notes (Signed)
? ?Office Visit Note ?  ?Patient: Wendy Crawford           ?Date of Birth: [birth year]           ?MRN: [id number] ?Visit Date: 02/22/2022 ?             ?Requested by: [name], MD ?[address] [place],  [place] [address] ?PCP: [name], MD ? ? ?Assessment & Plan: ?Visit Diagnoses:  ?1. Chronic pain of right knee   ?2. Unilateral primary osteoarthritis, right knee   ? ? ?Plan: Patient would like to proceed with surgery in the near future.  However she does need to work on weight loss.  She may benefit from cortisone injection in the future.  She will continue using Voltaren gel relief. ? ?Follow-Up Instructions: Return if symptoms worsen or fail to improve.  ? ?Orders:  ?Orders Placed This Encounter  ?Procedures  ? XR Knee 1-2 Views Right  ? ?No orders of the defined types were placed in this encounter. ? ? ? ? Procedures: ?No procedures performed ? ? ?Clinical Data: ?No additional findings. ? ? ?Subjective: ?Chief Complaint  ?Patient presents with  ? Right Knee - Pain  ? ? ?HPI ?Wendy Crawford comes in today due to right knee pain.  She is using a cane.  She notes she has swelling in the knee.  Knee pain 7 out of 10 pain at worst.  She has been using Voltaren gel and Aleve which helped for short period of time.  She states the knee is locking up on.  Patient is nondiabetic. ?Review of Systems ?Denies any chest pain shortness of breath or ongoing infections. ? ?Objective: ?Vital Signs: There were no vitals taken for this visit. ? ?Physical Exam ?General well-developed well-nourished female no acute distress mood and affect appropriate ?Ortho Exam ?Right knee no abnormal warmth erythema.  No instability valgus varus stressing.  Full range of motion tenderness along medial joint line.  Patellofemoral crepitus with passive range of motion calf supple nontender.  Left knee full range of motion without pain no instability valgus varus stressing.  Surgical incisions well-healed. ?Specialty Comments:   ?No specialty comments available. ? ?Imaging: ?XR Knee 1-2 Views Right ? ?Result Date: 02/22/2022 ?Right knee 2 views shows near bone-on-bone medial compartment.  Lateral compartment with periarticular osteophytes and moderate narrowing.  Severe patellofemoral changes.  No acute fractures bony abnormalities otherwise.  Knee is well located.  ? ? ?PMFS History: ?Patient Active Problem List  ? Diagnosis Date Noted  ? Status post total left knee replacement 11/07/2018  ? Primary osteoarthritis of both knees 03/26/2018  ? Chronic pain of both knees 07/01/2017  ? Unilateral primary osteoarthritis, left knee 07/01/2017  ? Unilateral primary osteoarthritis, right knee 07/01/2017  ? Osteoarthritis of left hip 06/08/2016  ? Status post total replacement of left hip 06/08/2016  ? Chronic cluster headache, not intractable 09/13/2015  ? COPD exacerbation ([place]) 09/13/2015  ? Primary snoring 06/17/2014  ? 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 Episodic cluster headache   ? RECTAL BLEEDING 12/02/2007  ? DIARRHEA 12/02/2007  ? ABDOMINAL PAIN RIGHT LOWER QUADRANT 12/02/2007  ? COLITIS, ULCERATIVE 06/23/2007  ? ?Past Medical History:  ?Diagnosis Date  ? Arthritis   ? 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 Colitis   ? ulcerative  ? Depression   ? Headache(784.0)   ? most severe for 2 months,cluster  ? Headaches, cluster   ?  02 and medication  ? Pneumonia YRS AGO  ? Primary snoring 06/17/2014  ? Psoriasis   ?  ?Family History  ?Problem Relation Age of Onset  ? Hypertension Mother   ? Diabetes Sister   ? Diabetes Brother   ? Heart disease Maternal Grandmother   ? Heart disease Maternal Grandfather   ? Colon cancer Neg Hx   ?  ?Past Surgical History:  ?Procedure Laterality Date  ? BREAST BIOPSY Right 1990  ? BREAST EXCISIONAL BIOPSY Right 1994  ? JOINT REPLACEMENT    ? TOTAL HIP ARTHROPLASTY Left 06/08/2016  ? Procedure: LEFT TOTAL HIP ARTHROPLASTY ANTERIOR APPROACH;  Surgeon:  Mcarthur Rossetti, MD;  Location: [facility];  Service: Orthopedics;  Laterality: Left;  ? TOTAL KNEE ARTHROPLASTY Left 11/07/2018  ? Procedure: LEFT TOTAL KNEE ARTHROPLASTY;  Surgeon: Mcarthur Rossetti, MD;  Location: [facility];  Service: Orthopedics;  Laterality: Left;  ? ?Social History  ? ?Occupational History  ? Occupation: self employed  ?Tobacco Use  ? Smoking status: Every Day  ?  Packs/day: 0.50  ?  Years: 42.00  ?  Pack years: 21.00  ?  Types: Cigarettes  ? Smokeless tobacco: Never  ?Vaping Use  ? Vaping Use: Never used  ?Substance and Sexual Activity  ? Alcohol use: No  ?  Alcohol/week: 0.0 standard drinks  ? Drug use: No  ? Sexual activity: Not on file  ? ? ? ? ? ? ?

## 2022-02-22 NOTE — Telephone Encounter (Signed)
I talked to the pt. She stated her height and weight is correct which makes her BMI 48 but states that she has medicare and [employer] and doesn't know what [employer] is. Can you please review her insurance again and advise? ?

## 2022-02-22 NOTE — Telephone Encounter (Signed)
Per our conversation, please call patient to explain.  Thanks! ?

## 2022-03-02 ENCOUNTER — Telehealth: Payer: Self-pay | Admitting: *Deleted

## 2022-03-02 NOTE — Telephone Encounter (Signed)
Spoke to patient at length about [employer]N and the Ortho bundle program, specifically about her BMI (it is 48 right now). We reviewed some tips that could help start some weight loss and she was agreeable to being referred to the [facility] with [facility]. I will call and make referral directly to them on Monday. In meantime, I think an injection was mentioned was mentioned at last appointment? She is agreeable to this. I can have someone schedule her if that's an option? Thanks. ?

## 2022-03-08 ENCOUNTER — Telehealth: Payer: Self-pay | Admitting: *Deleted

## 2022-03-08 NOTE — Telephone Encounter (Signed)
Telephone referral made to [facility] for patient. Discussed that patient would like to have a knee replacement but Is unable at this time due to BMI hard stop restrictions of < 40. Her last documented BMI is 48. Patient was agreeable to referral to help reach her goals of weight loss.  ?

## 2022-03-12 ENCOUNTER — Other Ambulatory Visit: Payer: Self-pay | Admitting: Family Medicine

## 2022-03-12 DIAGNOSIS — Z1231 Encounter for screening mammogram for malignant neoplasm of breast: Secondary | ICD-10-CM

## 2022-03-12 DIAGNOSIS — Z0289 Encounter for other administrative examinations: Secondary | ICD-10-CM

## 2022-03-20 ENCOUNTER — Encounter (INDEPENDENT_AMBULATORY_CARE_PROVIDER_SITE_OTHER): Payer: Self-pay | Admitting: Family Medicine

## 2022-03-20 ENCOUNTER — Ambulatory Visit (INDEPENDENT_AMBULATORY_CARE_PROVIDER_SITE_OTHER): Payer: Medicare Other | Admitting: Family Medicine

## 2022-03-20 VITALS — BP 130/80 | HR 78 | Temp 97.4°F | Ht 60.0 in | Wt 254.0 lb

## 2022-03-20 DIAGNOSIS — G8929 Other chronic pain: Secondary | ICD-10-CM

## 2022-03-20 DIAGNOSIS — R5383 Other fatigue: Secondary | ICD-10-CM

## 2022-03-20 DIAGNOSIS — E7841 Elevated Lipoprotein(a): Secondary | ICD-10-CM | POA: Diagnosis not present

## 2022-03-20 DIAGNOSIS — E7849 Other hyperlipidemia: Secondary | ICD-10-CM

## 2022-03-20 DIAGNOSIS — R0602 Shortness of breath: Secondary | ICD-10-CM

## 2022-03-20 DIAGNOSIS — Z6841 Body Mass Index (BMI) 40.0 and over, adult: Secondary | ICD-10-CM

## 2022-03-20 DIAGNOSIS — R7301 Impaired fasting glucose: Secondary | ICD-10-CM | POA: Diagnosis not present

## 2022-03-20 DIAGNOSIS — F39 Unspecified mood [affective] disorder: Secondary | ICD-10-CM | POA: Insufficient documentation

## 2022-03-20 DIAGNOSIS — G44029 Chronic cluster headache, not intractable: Secondary | ICD-10-CM

## 2022-03-20 DIAGNOSIS — Z9189 Other specified personal risk factors, not elsewhere classified: Secondary | ICD-10-CM

## 2022-03-20 DIAGNOSIS — E669 Obesity, unspecified: Secondary | ICD-10-CM

## 2022-03-20 DIAGNOSIS — Z1331 Encounter for screening for depression: Secondary | ICD-10-CM

## 2022-03-20 DIAGNOSIS — R739 Hyperglycemia, unspecified: Secondary | ICD-10-CM | POA: Insufficient documentation

## 2022-03-21 LAB — COMPREHENSIVE METABOLIC PANEL
ALT: 19 IU/L (ref 0–32)
AST: 15 IU/L (ref 0–40)
Albumin/Globulin Ratio: 2.2 (ref 1.2–2.2)
Albumin: 4.9 g/dL — ABNORMAL HIGH (ref 3.8–4.8)
Alkaline Phosphatase: 98 IU/L (ref 44–121)
BUN/Creatinine Ratio: 16 (ref 12–28)
BUN: 11 mg/dL (ref 8–27)
Bilirubin Total: 0.3 mg/dL (ref 0.0–1.2)
CO2: 24 mmol/L (ref 20–29)
Calcium: 10.2 mg/dL (ref 8.7–10.3)
Chloride: 104 mmol/L (ref 96–106)
Creatinine, Ser: 0.69 mg/dL (ref 0.57–1.00)
Globulin, Total: 2.2 g/dL (ref 1.5–4.5)
Glucose: 84 mg/dL (ref 70–99)
Potassium: 4.9 mmol/L (ref 3.5–5.2)
Sodium: 143 mmol/L (ref 134–144)
Total Protein: 7.1 g/dL (ref 6.0–8.5)
eGFR: 96 mL/min/{1.73_m2} (ref >59)

## 2022-03-21 LAB — CBC WITH DIFFERENTIAL/PLATELET
Basophils Absolute: 0 10*3/uL (ref 0.0–0.2)
Basos: 1 %
EOS (ABSOLUTE): 0.1 10*3/uL (ref 0.0–0.4)
Eos: 1 %
Hematocrit: 42.2 % (ref 34.0–46.6)
Hemoglobin: 14 g/dL (ref 11.1–15.9)
Immature Grans (Abs): 0 10*3/uL (ref 0.0–0.1)
Immature Granulocytes: 0 %
Lymphocytes Absolute: 1.8 10*3/uL (ref 0.7–3.1)
Lymphs: 34 %
MCH: 29.3 pg (ref 26.6–33.0)
MCHC: 33.2 g/dL (ref 31.5–35.7)
MCV: 88 fL (ref 79–97)
Monocytes Absolute: 0.3 10*3/uL (ref 0.1–0.9)
Monocytes: 6 %
Neutrophils Absolute: 3 10*3/uL (ref 1.4–7.0)
Neutrophils: 58 %
Platelets: 221 10*3/uL (ref 150–450)
RBC: 4.78 x10E6/uL (ref 3.77–5.28)
RDW: 13.7 % (ref 11.7–15.4)
WBC: 5.2 10*3/uL (ref 3.4–10.8)

## 2022-03-21 LAB — LIPID PANEL WITH LDL/HDL RATIO
Cholesterol, Total: 163 mg/dL (ref 100–199)
HDL: 66 mg/dL (ref >39)
LDL Chol Calc (NIH): 76 mg/dL (ref 0–99)
LDL/HDL Ratio: 1.2 ratio (ref 0.0–3.2)
Triglycerides: 123 mg/dL (ref 0–149)
VLDL Cholesterol Cal: 21 mg/dL (ref 5–40)

## 2022-03-21 LAB — HEMOGLOBIN A1C
Est. average glucose Bld gHb Est-mCnc: 123 mg/dL
Hgb A1c MFr Bld: 5.9 % — ABNORMAL HIGH (ref 4.8–5.6)

## 2022-03-21 LAB — VITAMIN B12: Vitamin B-12: 751 pg/mL (ref 232–1245)

## 2022-03-21 LAB — TSH: TSH: 4.76 u[IU]/mL — ABNORMAL HIGH (ref 0.450–4.500)

## 2022-03-21 LAB — FOLATE: Folate: >20 ng/mL (ref >3.0)

## 2022-03-21 LAB — VITAMIN D 25 HYDROXY (VIT D DEFICIENCY, FRACTURES): Vit D, 25-Hydroxy: 28 ng/mL — ABNORMAL LOW (ref 30.0–100.0)

## 2022-03-21 LAB — INSULIN, RANDOM: INSULIN: 13.9 u[IU]/mL (ref 2.6–24.9)

## 2022-03-21 LAB — T4: T4, Total: 9.2 ug/dL (ref 4.5–12.0)

## 2022-03-26 ENCOUNTER — Ambulatory Visit: Payer: Medicare Other

## 2022-03-26 ENCOUNTER — Ambulatory Visit
Admission: RE | Admit: 2022-03-26 | Discharge: 2022-03-26 | Disposition: A | Discharge status: Home / Self Care | Payer: Medicare Other | Source: Ambulatory Visit | Attending: Family Medicine | Admitting: Family Medicine

## 2022-03-26 DIAGNOSIS — Z1231 Encounter for screening mammogram for malignant neoplasm of breast: Secondary | ICD-10-CM

## 2022-04-10 NOTE — Progress Notes (Signed)
? ? ? ? ?Chief Complaint:  ? ?OBESITY ?Wendy Crawford (MR# [id number]) is a 66 y.o. female who presents for evaluation and treatment of obesity and related comorbidities. Current BMI is Body mass index is 49.61 kg/m?Marland Kitchen Wendy Crawford has been struggling with her weight for many years and has been unsuccessful in either losing weight, maintaining weight loss, or reaching her healthy weight goal. ? 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 She live with her husband Wendy Crawford. Eats out 4-5 times per week. Dislikes yogurt and cottage cheese. Snacks on chips, cookies, and occasionally skips breakfast. Referred her by Dr. Ninfa Linden of [facility] for knee replacement.  ? ?Wendy Crawford is currently in the action stage of change and ready to dedicate time achieving and maintaining a healthier weight. Wendy Crawford is interested in becoming our patient and working on intensive lifestyle modifications including (but not limited to) diet and exercise for weight loss. ? ?Wendy Crawford's habits were reviewed today and are as follows: Her family eats meals together, she thinks her family will eat healthier with her, her desired weight loss is 94 lbs, she has been heavy most of her life, her heaviest weight ever was 256 pounds, she has significant food cravings issues, she skips meals frequently, she frequently makes poor food choices, she has problems with excessive hunger, she frequently eats larger portions than normal, and she struggles with emotional eating. ? ?Depression Screen ?Wendy Crawford's Food and Mood (modified PHQ-9) score was 17. ? ? ?  03/20/2022  ?  9:16 AM  ?Depression screen PHQ 2/9  ?Decreased Interest 3  ?Down, Depressed, Hopeless 2  ?PHQ - 2 Score 5  ?Altered sleeping 0  ?Tired, decreased energy 3  ?Change in appetite 2  ?Feeling bad or failure about yourself  3  ?Trouble concentrating 1  ?Moving slowly or fidgety/restless 3  ?Suicidal thoughts 0  ?PHQ-9 Score 17  ?Difficult doing work/chores Very difficult   ? ?Subjective:  ? ?1. Other fatigue ?Wendy Crawford admits to daytime somnolence and admits to waking up still tired. Patient has a history of symptoms of daytime fatigue and morning fatigue. [name] generally gets 7 or 8 hours of sleep per night, and states that she has generally restful sleep. Snoring is present. Apneic episodes are not present. Epworth Sleepiness Score is 3.  ? ?2. SOB (shortness of breath) on exertion ?Wendy Crawford notes increasing shortness of breath with exercising and seems to be worsening over time with weight gain. She notes getting out of breath sooner with activity than she used to. This has not gotten worse recently. [name] denies shortness of breath at rest or orthopnea. ? ?3. Other hyperlipidemia ?Wendy Crawford has been on medications for 1 year. She is on atorvastatin and medication management per PCP.  ? ?4. Mood disorder (Wendy Crawford)- emotional eating ?Wendy Crawford has a history of GAD. PHQ-9 score is 17. Denies suicidal ideation. She is on Celexa. Current symptoms or concerns and declines referral to Psychology for counseling.  ? ?5. Elevated fasting blood sugar ?Never been told she has a history of pre-diabetes or insulin resistance. She eats "junk food". ? ?6. Chronic joint pain ?Patient with age related joint pain in knees and hips. Needs a right knee replacement. History of left knee and hip replacement. ? ?7. Chronic cluster headache, not intractable ?On Wendy Crawford for this daily. Symptoms well controlled on medications. Can tell if she misses a dose.  ? ?8. At risk for diabetes mellitus ?Wendy Crawford is at higher than average risk  for developing diabetes due to her obesity. ? ?Assessment/Plan:  ? ?Orders Placed This Encounter  ?Procedures  ? Vitamin B12  ? CBC with Differential/Platelet  ? Comprehensive metabolic panel  ? Hemoglobin A1c  ? Folate  ? VITAMIN D 25 Hydroxy (Vit-D Deficiency, Fractures)  ? TSH  ? T4  ? Lipid Panel With LDL/HDL Ratio  ? Insulin, random  ? EKG 12-Lead  ? ? ?There are no discontinued  medications.  ? ?No orders of the defined types were placed in this encounter. ?  ? ?1. Other fatigue ?Wendy Crawford does feel that her weight is causing her energy to be lower than it should be. Fatigue may be related to obesity, depression or many other causes. Labs will be ordered, and in the meanwhile, [name] will focus on self care including making healthy food choices, increasing physical activity and focusing on stress reduction. ? ?- EKG 12-Lead ?- Vitamin B12 ?- CBC with Differential/Platelet ?- Folate ?- VITAMIN D 25 Hydroxy (Vit-D Deficiency, Fractures) ?- TSH ?- T4 ? ?2. SOB (shortness of breath) on exertion ?[name] does feel that she gets out of breath more easily that she used to when she exercises. Wendy Crawford shortness of breath appears to be obesity related and exercise induced. She has agreed to work on weight loss and gradually increase exercise to treat her exercise induced shortness of breath. Will continue to monitor closely. ? ?3. Other hyperlipidemia ?Prudent nutritional plan with low saturated fats/trans fats. We will check labs today, and continue her medications per PCP.  ? ?- Lipid Panel With LDL/HDL Ratio ? ?4. Mood disorder (Wendy Crawford)- emotional eating ?Mood is stable and she does emotional eating, but declines Dr. Mallie Mussel referral.  ? ?5. Elevated fasting blood sugar ?We will check labs today. Work on prudent nutritional plan and weight loss.  ? ?- Comprehensive metabolic panel ?- Hemoglobin A1c ?- Insulin, random ? ?6. Chronic joint pain ?Ortho care per Dr. Ninfa Linden. Work on prudent nutritional plan and weight loss is recommended. ? ?7. Chronic cluster headache, not intractable ?Symptoms controlled on her medications. Continue treatment plan per PCP. We discussed how proper hydration and nutrition will help with symptoms.  ? ?8. Depression screening ?Wendy Crawford had a positive depression screening. Depression is commonly associated with obesity and often results in emotional eating behaviors. We will  monitor this closely and work on CBT to help improve the non-hunger eating patterns. Referral to Psychology may be required if no improvement is seen as she continues in our clinic. ? ?9. At risk for diabetes mellitus ?Wendy Crawford was given approximately 23 minutes of diabetic education and counseling today. We discussed intensive lifestyle modifications today with an emphasis on weight loss as well as increasing exercise and decreasing simple carbohydrates in her diet. We also reviewed medication options with an emphasis on risk versus benefits of those discussed. ? ?Repetitive spaced learning was employed today to elicit superior memory formation and behavioral change. ? ?10. Class 3 severe obesity with serious comorbidity and body mass index (BMI) of 45.0 to 49.9 in adult, unspecified obesity type (Wendy Crawford) ?Wendy Crawford is currently in the action stage of change and her goal is to continue with weight loss efforts. I recommend Wendy Crawford begin the structured treatment plan as follows: ? ?She has agreed to the Category 2 Plan with 6 oz of protein at lunch. ? ?Exercise goals: As is.  ? ?Behavioral modification strategies: increasing lean protein intake, decreasing simple carbohydrates, no skipping meals, avoiding temptations, and planning for success. ? ?She was informed  of the importance of frequent follow-up visits to maximize her success with intensive lifestyle modifications for her multiple health conditions. She was informed we would discuss her lab results at her next visit unless there is a critical issue that needs to be addressed sooner. Wendy Crawford agreed to keep her next visit at the agreed upon time to discuss these results. ? ?Objective:  ? ?Blood pressure 130/80, pulse 78, temperature (!) 97.4 ?F (36.3 ?C), height 5' (1.524 m), weight 254 lb (115.2 kg), SpO2 96 %. Body mass index is 49.61 kg/m?. ? ?EKG: Normal sinus rhythm, rate 71 BPM. ? ?Indirect Calorimeter completed today shows a VO2 of 286 and a REE of 1973.  Her  calculated basal metabolic rate is 2800 thus her basal metabolic rate is better than expected. ? ?General: Cooperative, alert, well developed, in no acute distress. ?HEENT: Conjunctivae and lids unremarkable. ?Cardio

## 2022-04-12 ENCOUNTER — Encounter (INDEPENDENT_AMBULATORY_CARE_PROVIDER_SITE_OTHER): Payer: Self-pay | Admitting: Family Medicine

## 2022-04-12 ENCOUNTER — Ambulatory Visit (INDEPENDENT_AMBULATORY_CARE_PROVIDER_SITE_OTHER): Payer: Medicare Other | Admitting: Family Medicine

## 2022-04-12 VITALS — BP 112/72 | HR 81 | Temp 98.0°F | Ht 60.0 in | Wt 241.0 lb

## 2022-04-12 DIAGNOSIS — R7303 Prediabetes: Secondary | ICD-10-CM | POA: Diagnosis not present

## 2022-04-12 DIAGNOSIS — Z6841 Body Mass Index (BMI) 40.0 and over, adult: Secondary | ICD-10-CM

## 2022-04-12 DIAGNOSIS — E669 Obesity, unspecified: Secondary | ICD-10-CM

## 2022-04-12 DIAGNOSIS — R7989 Other specified abnormal findings of blood chemistry: Secondary | ICD-10-CM | POA: Diagnosis not present

## 2022-04-12 DIAGNOSIS — E7849 Other hyperlipidemia: Secondary | ICD-10-CM

## 2022-04-12 DIAGNOSIS — E559 Vitamin D deficiency, unspecified: Secondary | ICD-10-CM | POA: Diagnosis not present

## 2022-04-12 MED ORDER — VITAMIN D (ERGOCALCIFEROL) 1.25 MG (50000 UNIT) PO CAPS: 50000.0000 [IU] | ORAL_CAPSULE | ORAL | 0 refills | Status: DC

## 2022-04-12 NOTE — Telephone Encounter (Signed)
Spoke with patient and updated on hard stop for BMI. Referral made to [facility]. ?

## 2022-04-17 ENCOUNTER — Other Ambulatory Visit: Payer: Self-pay | Admitting: Family Medicine

## 2022-04-17 DIAGNOSIS — Z720 Tobacco use: Secondary | ICD-10-CM

## 2022-04-17 DIAGNOSIS — G441 Vascular headache, not elsewhere classified: Secondary | ICD-10-CM

## 2022-04-18 NOTE — Telephone Encounter (Signed)
Requested too early, sent via Interface.  ?Requested Prescriptions  ?Pending Prescriptions Disp Refills  ?? citalopram (CELEXA) 20 MG tablet [Pharmacy Med Name: CITALOPRAM HBR 20 MG TABLET] 90 tablet 1  ?  Sig: TAKE 1 TABLET BY MOUTH EVERY DAY  ?  ? Psychiatry:  Antidepressants - SSRI Passed - 04/17/2022 12:31 PM  ?  ?  Passed - Valid encounter within last 6 months  ?  Recent Outpatient Visits   ?      ? 4 months ago COVID-19  ? [place] Eulogio Bear, NP  ? 1 year ago Pure hypercholesterolemia  ? [facility] Pick[place], Cammie Mcgee, MD  ? 1 year ago Herpes zoster without complication  ? [place], Crystal A, FNP  ? 1 year ago Herpes zoster without complication  ? [place], Crystal A, FNP  ? 2 years ago Screening cholesterol level  ? [facility] Pickard, Cammie Mcgee, MD  ?  ?  ? ?  ?  ?  ? ? ?

## 2022-04-26 NOTE — Progress Notes (Signed)
Chief Complaint:   OBESITY Wendy Crawford is here to discuss her progress with her obesity treatment plan along with follow-up of her obesity related diagnoses. Wendy Crawford is on the Category 2 Plan with 6 oz protein at lunch and states she is following her eating plan approximately 95% of the time. Wendy Crawford states she is doing some walking.  Today's visit was #: 2 Starting weight: 254 lbs Starting date: 03/20/2022 Today's weight: 241 lbs Today's date: 04/12/2022 Total lbs lost to date: 13 Total lbs lost since last in-office visit: 13  Interim History: Wendy Crawford is here today for her first follow-up office visit since starting the program with [place].  All blood work/ lab tests that were recently ordered by myself or an outside provider were reviewed with patient today per their request.   Extended time was spent counseling her on all new disease processes that were discovered or preexisting ones that are affected by BMI.  she understands that many of these abnormalities will need to monitored regularly along with the current treatment plan of prudent dietary changes, in which we are making each and every office visit, to improve these health parameters. Pt did great at her mom's birthday party and stayed on plan. She states she had "no cookies, cakes, chips, or donuts". She is even bringing her salad dressing to the restaurants to use instead of high calorie choices.   Subjective:   1. Pre-diabetes New. Discussed labs with patient today. Wendy Crawford's A1c is 5.9 with an insulin level of 13.9. She has no prior history of pre-diabetes. Pt denies cravings.  2. Elevated TSH Discussed labs with patient today. Pt has a slightly elevated TSH of 4.760. Her T4 is within normal limits at 9.2. Pt is asymptomatic and has no concerns.  3. Other hyperlipidemia Discussed labs with patient today. Wendy Crawford has an LDL of 76, HDL 66, and triglycerides 123 and improved from prior at more than 166 a year ago. Medication:  Lipitor 20 mg  4. Vitamin D deficiency New. Discussed labs with patient today. Pt has no history of Vit D deficiency. Her Vit D level is 28.0. At age 66, pt has never had a bone density scan and takes no supplement.  Assessment/Plan:  No orders of the defined types were placed in this encounter.   There are no discontinued medications.   Meds ordered this encounter  Medications   Vitamin D, Ergocalciferol, (DRISDOL) 1.25 MG (50000 UNIT) CAPS capsule    Sig: Take 1 capsule (50,000 Units total) by mouth every 7 (seven) days.    Dispense:  4 capsule    Refill:  0    30 d supply;  ** OV for RF **   Do not send RF request     1. Pre-diabetes Wendy Crawford will continue to work on weight loss, exercise, and decreasing simple carbohydrates to help decrease the risk of diabetes. Handouts given to pt on pre-diabetes and insulin resistance. Follow prudent nutritional plan and eventually increase exercise. Recheck labs in 3 months. Extensive counseling done with pt on this new medical issue and how the foods she eats affects her blood sugars.  2. Elevated TSH No need for meds at this time. We will recheck labs for stability with next blood draw or if symptoms develop.  3. Other hyperlipidemia Cardiovascular risk and specific lipid/LDL goals reviewed.  We discussed several lifestyle modifications today and Wendy Crawford will continue to work on diet, exercise and weight loss efforts. Orders and follow up as  documented in patient record. LDL at goal. Continue Lipitor per PCP. Continue prudent nutritional plan with decreased saturated and trans fats.  Counseling Intensive lifestyle modifications are the first line treatment for this issue. Dietary changes: Increase soluble fiber. Decrease simple carbohydrates. Exercise changes: Moderate to vigorous-intensity aerobic activity 150 minutes per week if tolerated. Lipid-lowering medications: see documented in medical record.  4. Vitamin D deficiency Contact PCP  regarding bone density referral and start Vit D 50,000 IU weekly. - I discussed the importance of vitamin D to the patient's health and well-being.  - I reviewed possible symptoms of low Vitamin D:  low energy, depressed mood, muscle aches, joint aches, osteoporosis etc. was reviewed with patient - low Vitamin D levels may be linked to an increased risk of cardiovascular events and even increased risk of cancers- such as colon and breast.  - ideal vitamin D levels reviewed with patient  - I recommend pt take a 50,000 IU weekly prescription vit D - see script below   - Informed patient this may be a lifelong thing, and she was encouraged to continue to take the medicine until told otherwise.    - weight loss will likely improve availability of vitamin D, thus encouraged Wendy Crawford to continue with meal plan and their weight loss efforts to further improve this condition.  Thus, we will need to monitor levels regularly (every 3-4 mo on average) to keep levels within normal limits and prevent over supplementation. - pt's questions and concerns regarding this condition addressed.  Start- Vitamin D, Ergocalciferol, (DRISDOL) 1.25 MG (50000 UNIT) CAPS capsule; Take 1 capsule (50,000 Units total) by mouth every 7 (seven) days.  Dispense: 4 capsule; Refill: 0  5. Obesity, Current BMI 47.1 Wendy Crawford is currently in the action stage of change. As such, her goal is to continue with weight loss efforts. She has agreed to the Category 2 Plan with 6 oz protein at lunch.   Pt is to measure proteins!  Exercise goals:  As is.  Behavioral modification strategies: increasing lean protein intake, increasing water intake, and meal planning and cooking strategies.  Wendy Crawford has agreed to follow-up with our clinic in 2-3 weeks. She was informed of the importance of frequent follow-up visits to maximize her success with intensive lifestyle modifications for her multiple health conditions.   Objective:   Blood pressure  112/72, pulse 81, temperature 98 F (36.7 C), height 5' (1.524 m), weight 241 lb (109.3 kg), SpO2 95 %. Body mass index is 47.07 kg/m.  General: Cooperative, alert, well developed, in no acute distress. HEENT: Conjunctivae and lids unremarkable. Cardiovascular: Regular rhythm.  Lungs: Normal work of breathing. Neurologic: No focal deficits.   Lab Results  Component Value Date   CREATININE 0.69 03/20/2022   BUN 11 03/20/2022   NA 143 03/20/2022   K 4.9 03/20/2022   CL 104 03/20/2022   CO2 24 03/20/2022   Lab Results  Component Value Date   ALT 19 03/20/2022   AST 15 03/20/2022   ALKPHOS 98 03/20/2022   BILITOT 0.3 03/20/2022   Lab Results  Component Value Date   HGBA1C 5.9 (H) 03/20/2022   Lab Results  Component Value Date   INSULIN 13.9 03/20/2022   Lab Results  Component Value Date   TSH 4.760 (H) 03/20/2022   Lab Results  Component Value Date   CHOL 163 03/20/2022   HDL 66 03/20/2022   LDLCALC 76 03/20/2022   LDLDIRECT 143 (H) 03/15/2020   TRIG 123 03/20/2022  CHOLHDL 3.1 04/11/2021   Lab Results  Component Value Date   VD25OH 28.0 (L) 03/20/2022   Lab Results  Component Value Date   WBC 5.2 03/20/2022   HGB 14.0 03/20/2022   HCT 42.2 03/20/2022   MCV 88 03/20/2022   PLT 221 03/20/2022    Attestation Statements:   Reviewed by clinician on day of visit: allergies, medications, problem list, medical history, surgical history, family history, social history, and previous encounter notes.  Time spent on visit including pre-visit chart review and post-visit care and charting was 55 minutes.   I, Kathlene November, BS, CMA, am acting as transcriptionist for [employer], DO.  I have reviewed the above documentation for accuracy and completeness, and I agree with the above. Marjory Sneddon, D.O.  The [place] was signed into law in 2016 which includes the topic of electronic health records.  This provides immediate access to  information in MyChart.  This includes consultation notes, operative notes, office notes, lab results and pathology reports.  If you have any questions about what you read please let us know at your next visit so we can discuss your concerns and take corrective action if need be.  We are right here with you.

## 2022-05-03 ENCOUNTER — Encounter (INDEPENDENT_AMBULATORY_CARE_PROVIDER_SITE_OTHER): Payer: Self-pay | Admitting: Family Medicine

## 2022-05-03 ENCOUNTER — Ambulatory Visit (INDEPENDENT_AMBULATORY_CARE_PROVIDER_SITE_OTHER): Payer: Medicare Other | Admitting: Family Medicine

## 2022-05-03 VITALS — BP 115/76 | HR 78 | Ht 60.0 in | Wt 235.0 lb

## 2022-05-03 DIAGNOSIS — M1711 Unilateral primary osteoarthritis, right knee: Secondary | ICD-10-CM

## 2022-05-03 DIAGNOSIS — E559 Vitamin D deficiency, unspecified: Secondary | ICD-10-CM | POA: Diagnosis not present

## 2022-05-03 DIAGNOSIS — Z6841 Body Mass Index (BMI) 40.0 and over, adult: Secondary | ICD-10-CM | POA: Diagnosis not present

## 2022-05-03 DIAGNOSIS — E669 Obesity, unspecified: Secondary | ICD-10-CM | POA: Diagnosis not present

## 2022-05-03 MED ORDER — VITAMIN D (ERGOCALCIFEROL) 1.25 MG (50000 UNIT) PO CAPS: 50000.0000 [IU] | ORAL_CAPSULE | ORAL | 0 refills | Status: DC

## 2022-05-11 NOTE — Progress Notes (Unsigned)
Chief Complaint:   OBESITY Wendy Crawford is here to discuss her progress with her obesity treatment plan along with follow-up of her obesity related diagnoses. Wendy Crawford is on the Category 2 Plan with 6 oz protein at lunch and states she is following her eating plan approximately 95% of the time. Wendy Crawford states she is walking and cycling 15 minutes 2-3 times per week.  Today's visit was #: 3 Starting weight: 254 lbs Starting date: 03/20/2022 Today's weight: 235 lbs Today's date: 05/03/2022 Total lbs lost to date: 19 Total lbs lost since last in-office visit: 6  Interim History: This is Wendy Crawford's 3rd OV. Sometimes she doesn't like the bread on the plan. Pt is weighing her proteins regularly. She denies hunger or cravings and is doing great!  Subjective:   1. Vitamin D deficiency She is currently taking prescription vitamin D 50,000 IU each week. She denies nausea, vomiting or muscle weakness.  2. Osteoarthritis of right knee, unspecified osteoarthritis type Wendy Crawford is working with Dr. Ninfa Linden. She needs a BMI of less than 40 to have her knee replacement. She takes Aleve as needed.  Assessment/Plan:  No orders of the defined types were placed in this encounter.   Medications Discontinued During This Encounter  Medication Reason   Vitamin D, Ergocalciferol, (DRISDOL) 1.25 MG (50000 UNIT) CAPS capsule Reorder     Meds ordered this encounter  Medications   Vitamin D, Ergocalciferol, (DRISDOL) 1.25 MG (50000 UNIT) CAPS capsule    Sig: Take 1 capsule (50,000 Units total) by mouth every 7 (seven) days.    Dispense:  4 capsule    Refill:  0    30 d supply;  ** OV for RF **   Do not send RF request     1. Vitamin D deficiency Low Vitamin D level contributes to fatigue and are associated with obesity, breast, and colon cancer. She agrees to continue to take prescription Vitamin D @50 ,000 IU every week and will follow-up for routine testing of Vitamin D, at least 2-3 times per year to avoid  over-replacement.  Refill- Vitamin D, Ergocalciferol, (DRISDOL) 1.25 MG (50000 UNIT) CAPS capsule; Take 1 capsule (50,000 Units total) by mouth every 7 (seven) days.  Dispense: 4 capsule; Refill: 0  2. Osteoarthritis of right knee, unspecified osteoarthritis type I recommend ice after exercise for 15-20 minutes and continue OTC pain meds.  3. Obesity, Current BMI 45.9 Wendy Crawford is currently in the action stage of change. As such, her goal is to continue with weight loss efforts. She has agreed to the Category 2 Plan with 6 oz protein at lunch.   Exercise goals:  As is  Behavioral modification strategies: planning for success.  Wendy Crawford has agreed to follow-up with our clinic in 2 weeks. She was informed of the importance of frequent follow-up visits to maximize her success with intensive lifestyle modifications for her multiple health conditions.   Objective:   Blood pressure 115/76, pulse 78, height 5' (1.524 m), weight 235 lb (106.6 kg), SpO2 95 %. Body mass index is 45.9 kg/m.  General: Cooperative, alert, well developed, in no acute distress. HEENT: Conjunctivae and lids unremarkable. Cardiovascular: Regular rhythm.  Lungs: Normal work of breathing. Neurologic: No focal deficits.   Lab Results  Component Value Date   CREATININE 0.69 03/20/2022   BUN 11 03/20/2022   NA 143 03/20/2022   K 4.9 03/20/2022   CL 104 03/20/2022   CO2 24 03/20/2022   Lab Results  Component Value Date  ALT 19 03/20/2022   AST 15 03/20/2022   ALKPHOS 98 03/20/2022   BILITOT 0.3 03/20/2022   Lab Results  Component Value Date   HGBA1C 5.9 (H) 03/20/2022   Lab Results  Component Value Date   INSULIN 13.9 03/20/2022   Lab Results  Component Value Date   TSH 4.760 (H) 03/20/2022   Lab Results  Component Value Date   CHOL 163 03/20/2022   HDL 66 03/20/2022   LDLCALC 76 03/20/2022   LDLDIRECT 143 (H) 03/15/2020   TRIG 123 03/20/2022   CHOLHDL 3.1 04/11/2021   Lab Results  Component  Value Date   VD25OH 28.0 (L) 03/20/2022   Lab Results  Component Value Date   WBC 5.2 03/20/2022   HGB 14.0 03/20/2022   HCT 42.2 03/20/2022   MCV 88 03/20/2022   PLT 221 03/20/2022   No results found for: "IRON", "TIBC", "FERRITIN"  Obesity Behavioral Intervention:   Approximately 15 minutes were spent on the discussion below.  ASK: We discussed the diagnosis of obesity with Wendy Crawford today and Wendy Crawford agreed to give [place] permission to discuss obesity behavioral modification therapy today.  ASSESS: Wendy Crawford has the diagnosis of obesity and her BMI today is 45.9. Wendy Crawford is in the action stage of change.   ADVISE: Wendy Crawford was educated on the multiple health risks of obesity as well as the benefit of weight loss to improve her health. She was advised of the need for long term treatment and the importance of lifestyle modifications to improve her current health and to decrease her risk of future health problems.  AGREE: Multiple dietary modification options and treatment options were discussed and Kaliegh agreed to follow the recommendations documented in the above note.  ARRANGE: Demeka was educated on the importance of frequent visits to treat obesity as outlined per CMS and USPSTF guidelines and agreed to schedule her next follow up appointment today.  Attestation Statements:   Reviewed by clinician on day of visit: allergies, medications, problem list, medical history, surgical history, family history, social history, and previous encounter notes.  I, Kathlene November, BS, CMA, am acting as transcriptionist for [employer], DO.  I have reviewed the above documentation for accuracy and completeness, and I agree with the above. Wendy Crawford, D.O.  The [place] was signed into law in 2016 which includes the topic of electronic health records.  This provides immediate access to information in MyChart.  This includes consultation notes, operative notes, office  notes, lab results and pathology reports.  If you have any questions about what you read please let us know at your next visit so we can discuss your concerns and take corrective action if need be.  We are right here with you.

## 2022-05-17 ENCOUNTER — Ambulatory Visit (INDEPENDENT_AMBULATORY_CARE_PROVIDER_SITE_OTHER): Payer: Medicare Other | Admitting: Nurse Practitioner

## 2022-05-17 ENCOUNTER — Encounter (INDEPENDENT_AMBULATORY_CARE_PROVIDER_SITE_OTHER): Payer: Self-pay | Admitting: Nurse Practitioner

## 2022-05-17 VITALS — BP 101/67 | HR 71 | Temp 97.9°F | Ht 60.0 in | Wt 233.0 lb

## 2022-05-17 DIAGNOSIS — Z6841 Body Mass Index (BMI) 40.0 and over, adult: Secondary | ICD-10-CM | POA: Diagnosis not present

## 2022-05-17 DIAGNOSIS — F39 Unspecified mood [affective] disorder: Secondary | ICD-10-CM | POA: Diagnosis not present

## 2022-05-17 DIAGNOSIS — E669 Obesity, unspecified: Secondary | ICD-10-CM | POA: Diagnosis not present

## 2022-05-17 DIAGNOSIS — E559 Vitamin D deficiency, unspecified: Secondary | ICD-10-CM

## 2022-05-17 MED ORDER — BUPROPION HCL ER (SR) 150 MG PO TB12: 150.0000 mg | ORAL_TABLET | Freq: Every day | ORAL | 0 refills | Status: DC

## 2022-05-21 NOTE — Progress Notes (Signed)
Chief Complaint:   OBESITY Wendy Crawford is here to discuss her progress with her obesity treatment plan along with follow-up of her obesity related diagnoses. [name] is on the Category 2 Plan and states she is following her eating plan approximately 90% of the time. [name] states she is walking 15 minutes 2-3 times per week.  Today's visit was #: 4 Starting weight: 254 lbs Starting date: 03/20/2022 Today's weight: 233 lbs Today's date: 05/17/2022 Total lbs lost to date: 21 lbs Total lbs lost since last in-office visit: 2  Interim History: [name] has done well with weight loss. She has been more stressed since her last visit. She is helping to care for her mother, who has dementia. [name] wants to lose weight to proceed with Rt knee surgery. She is meeting protein goals and drinking water and coffee daily. Denies drinking soda. Reports hunger and cravings when "stressed out".   Subjective:   1. Vitamin D deficiency [name]'s last Vit D level was 28.0. She is currently taking prescription Vit D 50,000 IU once a week. Denies any nausea, vomiting or muscle weakness.  2. Mood disorder ([place])- emotional eating Alyssamarie reports emotional/stress eating, caring for her mother with dementia.   Assessment/Plan:   1. Vitamin D deficiency [name] will continue with taking Vit D 50,000 IU once a week.  Low Vitamin D level contributes to fatigue and are associated with obesity, breast, and colon cancer. She agrees to continue to take prescription Vitamin D @50 ,000 IU every week and will follow-up for routine testing of Vitamin D, at least 2-3 times per year to avoid over-replacement.   2. Mood disorder ([place])- emotional eating Start Wellbutrin SR 150 mg daily. Side effects discussed.   -Start buPROPion (WELLBUTRIN SR) 150 MG 12 hr tablet; Take 1 tablet (150 mg total) by mouth daily.  Dispense: 30 tablet; Refill: 0  3. Obesity, Current BMI 45.5 [name] is currently in the action stage of change. As  such, her goal is to continue with weight loss efforts. She has agreed to the Category 2 Plan.   Exercise goals: As is.  Behavioral modification strategies: no skipping meals, dealing with family or coworker sabotage, and planning for success.  [name] has agreed to follow-up with our clinic in 2 weeks. She was informed of the importance of frequent follow-up visits to maximize her success with intensive lifestyle modifications for her multiple health conditions.   Objective:   Blood pressure 101/67, pulse 71, temperature 97.9 F (36.6 C), height 5' (1.524 m), weight 233 lb (105.7 kg), SpO2 96 %. Body mass index is 45.5 kg/m.  General: Cooperative, alert, well developed, in no acute distress. HEENT: Conjunctivae and lids unremarkable. Cardiovascular: Regular rhythm.  Lungs: Normal work of breathing. Neurologic: No focal deficits.   Lab Results  Component Value Date   CREATININE 0.69 03/20/2022   BUN 11 03/20/2022   NA 143 03/20/2022   K 4.9 03/20/2022   CL 104 03/20/2022   CO2 24 03/20/2022   Lab Results  Component Value Date   ALT 19 03/20/2022   AST 15 03/20/2022   ALKPHOS 98 03/20/2022   BILITOT 0.3 03/20/2022   Lab Results  Component Value Date   HGBA1C 5.9 (H) 03/20/2022   Lab Results  Component Value Date   INSULIN 13.9 03/20/2022   Lab Results  Component Value Date   TSH 4.760 (H) 03/20/2022   Lab Results  Component Value Date   CHOL 163 03/20/2022   HDL 66 03/20/2022  LDLCALC 76 03/20/2022   LDLDIRECT 143 (H) 03/15/2020   TRIG 123 03/20/2022   CHOLHDL 3.1 04/11/2021   Lab Results  Component Value Date   VD25OH 28.0 (L) 03/20/2022   Lab Results  Component Value Date   WBC 5.2 03/20/2022   HGB 14.0 03/20/2022   HCT 42.2 03/20/2022   MCV 88 03/20/2022   PLT 221 03/20/2022   No results found for: "IRON", "TIBC", "FERRITIN"  Attestation Statements:   Reviewed by clinician on day of visit: allergies, medications, problem list, medical  history, surgical history, family history, social history, and previous encounter notes.  I, Brendell Tyus, RMA, am acting as transcriptionist for Everardo Pacific, FNP..  I have reviewed the above documentation for accuracy and completeness, and I agree with the above. Everardo Pacific, FNP

## 2022-05-23 ENCOUNTER — Other Ambulatory Visit (INDEPENDENT_AMBULATORY_CARE_PROVIDER_SITE_OTHER): Payer: Self-pay | Admitting: Family Medicine

## 2022-05-23 DIAGNOSIS — E559 Vitamin D deficiency, unspecified: Secondary | ICD-10-CM

## 2022-05-26 ENCOUNTER — Other Ambulatory Visit: Payer: Self-pay | Admitting: Family Medicine

## 2022-05-26 DIAGNOSIS — Z720 Tobacco use: Secondary | ICD-10-CM

## 2022-05-26 DIAGNOSIS — G441 Vascular headache, not elsewhere classified: Secondary | ICD-10-CM

## 2022-05-31 ENCOUNTER — Encounter (INDEPENDENT_AMBULATORY_CARE_PROVIDER_SITE_OTHER): Payer: Self-pay | Admitting: Family Medicine

## 2022-05-31 ENCOUNTER — Ambulatory Visit (INDEPENDENT_AMBULATORY_CARE_PROVIDER_SITE_OTHER): Payer: Medicare Other | Admitting: Family Medicine

## 2022-05-31 VITALS — BP 124/80 | HR 82 | Temp 98.2°F | Ht 60.0 in | Wt 228.0 lb

## 2022-05-31 DIAGNOSIS — Z6841 Body Mass Index (BMI) 40.0 and over, adult: Secondary | ICD-10-CM | POA: Diagnosis not present

## 2022-05-31 DIAGNOSIS — E669 Obesity, unspecified: Secondary | ICD-10-CM | POA: Diagnosis not present

## 2022-05-31 DIAGNOSIS — E559 Vitamin D deficiency, unspecified: Secondary | ICD-10-CM | POA: Diagnosis not present

## 2022-05-31 DIAGNOSIS — R457 State of emotional shock and stress, unspecified: Secondary | ICD-10-CM

## 2022-05-31 MED ORDER — VITAMIN D (ERGOCALCIFEROL) 1.25 MG (50000 UNIT) PO CAPS: 50000.0000 [IU] | ORAL_CAPSULE | ORAL | 0 refills | Status: DC

## 2022-05-31 NOTE — Progress Notes (Signed)
Chief Complaint:   OBESITY Wendy Crawford is here to discuss her progress with her obesity treatment plan along with follow-up of her obesity related diagnoses. [name] is on the Category 2 Plan and states she is following her eating plan approximately 90-95% of the time. [name] states she is doing PT 15 minutes 5 times per week.  Today's visit was #: 5 Starting weight: 254 lbs Starting date: 03/20/2022 Today's weight: 228 lbs Today's date: 05/31/2022 Total lbs lost to date: 26 lbs Total lbs lost since last in-office visit: 5 lbs  Interim History: Occasionally skips 1-2 pieces of bread, otherwise eats all of her foods on the plan.  She denies any hunger or cravings.  Subjective:   1. Caregiver stress syndrome - emotional eating Started Wellbutrin last office visit for emotional eating and stress eating.  Giving her energy  and feels great, she has been motivated.  She is tolerating the medication well and sleeping well.  Decreased stress.  2. Vitamin D deficiency She is currently taking prescription vitamin D 50,000 IU each week. She denies nausea, vomiting or muscle weakness.  Assessment/Plan:  No orders of the defined types were placed in this encounter.   Medications Discontinued During This Encounter  Medication Reason   Vitamin D, Ergocalciferol, (DRISDOL) 1.25 MG (50000 UNIT) CAPS capsule Reorder     Meds ordered this encounter  Medications   Vitamin D, Ergocalciferol, (DRISDOL) 1.25 MG (50000 UNIT) CAPS capsule    Sig: Take 1 capsule (50,000 Units total) by mouth every 7 (seven) days.    Dispense:  4 capsule    Refill:  0    30 d supply;  ** OV for RF **   Do not send RF request     1. Caregiver stress syndrome - emotional eating Continue Wellbutrin, self care, food prep and exercise.  2. Vitamin D deficiency Low Vitamin D level contributes to fatigue and are associated with obesity, breast, and colon cancer. She agrees to continue to take prescription Vitamin D  @50 ,000 IU every week and will follow-up for routine testing of Vitamin D, at least 2-3 times per year to avoid over-replacement.  Refill - Vitamin D, Ergocalciferol, (DRISDOL) 1.25 MG (50000 UNIT) CAPS capsule; Take 1 capsule (50,000 Units total) by mouth every 7 (seven) days.  Dispense: 4 capsule; Refill: 0  3. Obesity, Current BMI 44.6 We will recheck labs on 06/09/2022  [name] is currently in the action stage of change. As such, her goal is to continue with weight loss efforts. She has agreed to the Category 2 Plan.   Exercise goals:  As is.  Behavioral modification strategies: increasing water intake and planning for success.  [name] has agreed to follow-up with our clinic in 2 weeks. She was informed of the importance of frequent follow-up visits to maximize her success with intensive lifestyle modifications for her multiple health conditions.   Objective:   Blood pressure 124/80, pulse 82, temperature 98.2 F (36.8 C), height 5' (1.524 m), weight 228 lb (103.4 kg), SpO2 96 %. Body mass index is 44.53 kg/m.  General: Cooperative, alert, well developed, in no acute distress. HEENT: Conjunctivae and lids unremarkable. Cardiovascular: Regular rhythm.  Lungs: Normal work of breathing. Neurologic: No focal deficits.   Lab Results  Component Value Date   CREATININE 0.69 03/20/2022   BUN 11 03/20/2022   NA 143 03/20/2022   K 4.9 03/20/2022   CL 104 03/20/2022   CO2 24 03/20/2022   Lab Results  Component  Value Date   ALT 19 03/20/2022   AST 15 03/20/2022   ALKPHOS 98 03/20/2022   BILITOT 0.3 03/20/2022   Lab Results  Component Value Date   HGBA1C 5.9 (H) 03/20/2022   Lab Results  Component Value Date   INSULIN 13.9 03/20/2022   Lab Results  Component Value Date   TSH 4.760 (H) 03/20/2022   Lab Results  Component Value Date   CHOL 163 03/20/2022   HDL 66 03/20/2022   LDLCALC 76 03/20/2022   LDLDIRECT 143 (H) 03/15/2020   TRIG 123 03/20/2022   CHOLHDL 3.1  04/11/2021   Lab Results  Component Value Date   VD25OH 28.0 (L) 03/20/2022   Lab Results  Component Value Date   WBC 5.2 03/20/2022   HGB 14.0 03/20/2022   HCT 42.2 03/20/2022   MCV 88 03/20/2022   PLT 221 03/20/2022   No results found for: "IRON", "TIBC", "FERRITIN"  Obesity Behavioral Intervention:   Approximately 15 minutes were spent on the discussion below.  ASK: We discussed the diagnosis of obesity with [name] today and Treniya agreed to give [place] permission to discuss obesity behavioral modification therapy today.  ASSESS: Columbia has the diagnosis of obesity and her BMI today is 44.6. Tehila is in the action stage of change.   ADVISE: [name] was educated on the multiple health risks of obesity as well as the benefit of weight loss to improve her health. She was advised of the need for long term treatment and the importance of lifestyle modifications to improve her current health and to decrease her risk of future health problems.  AGREE: Multiple dietary modification options and treatment options were discussed and Syd agreed to follow the recommendations documented in the above note.  ARRANGE: Ashyla was educated on the importance of frequent visits to treat obesity as outlined per CMS and USPSTF guidelines and agreed to schedule her next follow up appointment today.  Attestation Statements:   Reviewed by clinician on day of visit: allergies, medications, problem list, medical history, surgical history, family history, social history, and previous encounter notes.  I, Davy Pique, RMA, am acting as Location manager for [employer], DO.  I have reviewed the above documentation for accuracy and completeness, and I agree with the above. Marjory Sneddon, D.O.  [name] was signed into law in 2016 which includes the topic of electronic health records.  This provides immediate access to information in MyChart.  This includes consultation notes,  operative notes, office notes, lab results and pathology reports.  If you have any questions about what you read please let us know at your next visit so we can discuss your concerns and take corrective action if need be.  We are right here with you.

## 2022-06-08 ENCOUNTER — Other Ambulatory Visit (INDEPENDENT_AMBULATORY_CARE_PROVIDER_SITE_OTHER): Payer: Self-pay | Admitting: Nurse Practitioner

## 2022-06-08 DIAGNOSIS — F39 Unspecified mood [affective] disorder: Secondary | ICD-10-CM

## 2022-06-14 ENCOUNTER — Ambulatory Visit (INDEPENDENT_AMBULATORY_CARE_PROVIDER_SITE_OTHER): Payer: Medicare Other | Admitting: Nurse Practitioner

## 2022-06-14 ENCOUNTER — Encounter (INDEPENDENT_AMBULATORY_CARE_PROVIDER_SITE_OTHER): Payer: Self-pay | Admitting: Nurse Practitioner

## 2022-06-14 VITALS — BP 128/81 | HR 82 | Temp 98.2°F | Ht 60.0 in | Wt 226.0 lb

## 2022-06-14 DIAGNOSIS — E669 Obesity, unspecified: Secondary | ICD-10-CM

## 2022-06-14 DIAGNOSIS — Z6841 Body Mass Index (BMI) 40.0 and over, adult: Secondary | ICD-10-CM

## 2022-06-14 DIAGNOSIS — F39 Unspecified mood [affective] disorder: Secondary | ICD-10-CM

## 2022-06-14 DIAGNOSIS — F5089 Other specified eating disorder: Secondary | ICD-10-CM | POA: Diagnosis not present

## 2022-06-14 MED ORDER — BUPROPION HCL ER (SR) 150 MG PO TB12: 150.0000 mg | ORAL_TABLET | Freq: Two times a day (BID) | ORAL | 0 refills | Status: DC

## 2022-06-14 NOTE — Telephone Encounter (Signed)
Patient called to check the status of this refill. I apologized for the delay.  LOV 04/11/21 She is scheduled for 06/22/22

## 2022-06-15 ENCOUNTER — Other Ambulatory Visit: Payer: Self-pay | Admitting: Family Medicine

## 2022-06-15 DIAGNOSIS — Z720 Tobacco use: Secondary | ICD-10-CM

## 2022-06-15 DIAGNOSIS — G441 Vascular headache, not elsewhere classified: Secondary | ICD-10-CM

## 2022-06-15 MED ORDER — CITALOPRAM HYDROBROMIDE 20 MG PO TABS: 20.0000 mg | ORAL_TABLET | Freq: Every day | ORAL | 3 refills | Status: DC

## 2022-06-15 NOTE — Telephone Encounter (Signed)
LVM letting patient know that Dr. Dennard Schaumann has sent this medication in.

## 2022-06-18 NOTE — Progress Notes (Signed)
Chief Complaint:   OBESITY Wendy Crawford is here to discuss her progress with her obesity treatment plan along with follow-up of her obesity related diagnoses. Veretta is on the Category 2 Plan and states she is following her eating plan approximately 90% of the time. Tassie states she is exercising 0 minutes 0 times per week.  Today's visit was #: 6 Starting weight: 254 lbs Starting date: 03/20/2022 Today's weight: 226 lbs Today's date:06/14/2022 Total lbs lost to date: 28 lbs Total lbs lost since last in-office visit: 2  Interim History: Wendy Crawford has overall done well with weight loss. Denies hunger but still has some cravings. Celebrated the 4th of July. She is looking forward to going to [facility] in August. She does well with Cat 2 plan but does not usually eat the bread. Drinking coffee, water,and occasional tea. Struggles with knee pain and is unable to exercise. She is working on BMI reduction to be able to proceed with surgery.   Subjective:   1. Mood disorder ([place])- emotional eating [name] is currently taking Wellbutrin SR 150 mg. Denies any side effects. Notes more stress the days she is caring for her mother. Still struggling with cravings especially in the evenings. She is requesting to increase dose.  Assessment/Plan:   1. Mood disorder ([place])- emotional eating INCREASE Wellbutrin SR to 150 mg twice a day. Side effects discussed.   -Increase buPROPion (WELLBUTRIN SR) 150 MG 12 hr tablet; Take 1 tablet (150 mg total) by mouth 2 (two) times daily.  Dispense: 60 tablet; Refill: 0  2. Obesity, Current BMI 44.2 Wendy Crawford is currently in the action stage of change. As such, her goal is to continue with weight loss efforts. She has agreed to the Category 2 Plan.   Discussed obtaining labs in the next 1-2 visits. Not fasting today.  Exercise goals: As is.  Behavioral modification strategies: increasing lean protein intake, increasing water intake, and planning for success.  Wendy Crawford  has agreed to follow-up with our clinic in 2 weeks. She was informed of the importance of frequent follow-up visits to maximize her success with intensive lifestyle modifications for her multiple health conditions.   Objective:   Blood pressure 128/81, pulse 82, temperature 98.2 F (36.8 C), height 5' (1.524 m), weight 226 lb (102.5 kg), SpO2 96 %. Body mass index is 44.14 kg/m.  General: Cooperative, alert, well developed, in no acute distress. HEENT: Conjunctivae and lids unremarkable. Cardiovascular: Regular rhythm.  Lungs: Normal work of breathing. Neurologic: No focal deficits.   Lab Results  Component Value Date   CREATININE 0.69 03/20/2022   BUN 11 03/20/2022   NA 143 03/20/2022   K 4.9 03/20/2022   CL 104 03/20/2022   CO2 24 03/20/2022   Lab Results  Component Value Date   ALT 19 03/20/2022   AST 15 03/20/2022   ALKPHOS 98 03/20/2022   BILITOT 0.3 03/20/2022   Lab Results  Component Value Date   HGBA1C 5.9 (H) 03/20/2022   Lab Results  Component Value Date   INSULIN 13.9 03/20/2022   Lab Results  Component Value Date   TSH 4.760 (H) 03/20/2022   Lab Results  Component Value Date   CHOL 163 03/20/2022   HDL 66 03/20/2022   LDLCALC 76 03/20/2022   LDLDIRECT 143 (H) 03/15/2020   TRIG 123 03/20/2022   CHOLHDL 3.1 04/11/2021   Lab Results  Component Value Date   VD25OH 28.0 (L) 03/20/2022   Lab Results  Component Value Date  WBC 5.2 03/20/2022   HGB 14.0 03/20/2022   HCT 42.2 03/20/2022   MCV 88 03/20/2022   PLT 221 03/20/2022   No results found for: "IRON", "TIBC", "FERRITIN"  Attestation Statements:   Reviewed by clinician on day of visit: allergies, medications, problem list, medical history, surgical history, family history, social history, and previous encounter notes.  I, Brendell Tyus, RMA, am acting as transcriptionist for Everardo Pacific, FNP.  I have reviewed the above documentation for accuracy and completeness, and I agree  with the above. Everardo Pacific, FNP

## 2022-06-20 ENCOUNTER — Other Ambulatory Visit: Payer: Self-pay | Admitting: Family Medicine

## 2022-06-20 ENCOUNTER — Telehealth: Payer: Self-pay

## 2022-06-20 DIAGNOSIS — F39 Unspecified mood [affective] disorder: Secondary | ICD-10-CM

## 2022-06-20 NOTE — Telephone Encounter (Signed)
Pharmacy faxed a refill request for buPROPion [facility] SR) 150 MG 12 hr tablet [[id number]]    Order Details Dose: 150 mg Route: Oral Frequency: 2 times daily  Dispense Quantity: 60 tablet Refills: 0        Sig: Take 1 tablet (150 mg total) by mouth 2 (two) times daily.       Start Date: 06/14/22 End Date: --  Written Date: 06/14/22 Expiration Date: 06/14/23

## 2022-06-21 MED ORDER — BUPROPION HCL ER (SR) 150 MG PO TB12: 150.0000 mg | ORAL_TABLET | Freq: Two times a day (BID) | ORAL | 0 refills | Status: DC

## 2022-06-21 NOTE — Telephone Encounter (Signed)
Please call pt to make an CPE has not been seen since 5/22.   We did a curtesy refill this time but pt must keep appt in order for her next refills.

## 2022-06-21 NOTE — Telephone Encounter (Signed)
Requested Prescriptions  Pending Prescriptions Disp Refills  . atorvastatin (LIPITOR) 20 MG tablet [Pharmacy Med Name: ATORVASTATIN 20 MG TABLET] 90 tablet 0    Sig: TAKE 1 TABLET BY MOUTH EVERY DAY     Cardiovascular:  Antilipid - Statins Failed - 06/20/2022  3:24 AM      Failed - Lipid Panel in normal range within the last 12 months    Cholesterol, Total  Date Value Ref Range Status  03/20/2022 163 100 - 199 mg/dL Final   LDL Cholesterol (Calc)  Date Value Ref Range Status  04/11/2021 106 (H) mg/dL (calc) Final    Comment:    Reference range: <100 . Desirable range <100 mg/dL for primary prevention;   <70 mg/dL for patients with CHD or diabetic patients  with > or = 2 CHD risk factors. Marland Kitchen LDL-C is now calculated using the Martin-Hopkins  calculation, which is a validated novel method providing  better accuracy than the Friedewald equation in the  estimation of LDL-C.  Cresenciano Genre et al. Annamaria Helling. 4536;468(03): 2061-2068  (http://education.[facility].com/faq/FAQ164)    LDL Chol Calc (NIH)  Date Value Ref Range Status  03/20/2022 76 0 - 99 mg/dL Final   Direct LDL  Date Value Ref Range Status  03/15/2020 143 (H) <100 mg/dL Final    Comment:    Greatly elevated Triglycerides values (>1200 mg/dL) interfere with the dLDL assay. As no Triglycerides  testing was ordered, interpret results with caution. . Desirable range <100 mg/dL for primary prevention;   <70 mg/dL for patients with CHD or diabetic patients  with > or = 2 CHD risk factors. Marland Kitchen    HDL  Date Value Ref Range Status  03/20/2022 66 >39 mg/dL Final   Triglycerides  Date Value Ref Range Status  03/20/2022 123 0 - 149 mg/dL Final         Passed - Patient is not pregnant      Passed - Valid encounter within last 12 months    Recent Outpatient Visits          7 months ago [place] Eulogio Bear, NP   1 year ago Pure hypercholesterolemia   [place] Dennard Schaumann, Cammie Mcgee, MD   1 year ago Herpes zoster without complication   [place], [place], FNP   1 year ago Herpes zoster without complication   [place], [place], FNP   2 years ago Screening cholesterol level   [place] Pickard, Cammie Mcgee, MD      Future Appointments            Tomorrow Pickard, Cammie Mcgee, MD [place], [place]

## 2022-06-22 ENCOUNTER — Ambulatory Visit (INDEPENDENT_AMBULATORY_CARE_PROVIDER_SITE_OTHER): Payer: Medicare Other | Admitting: Family Medicine

## 2022-06-22 VITALS — BP 118/62 | HR 83 | Temp 98.2°F | Ht 60.0 in | Wt 227.0 lb

## 2022-06-22 DIAGNOSIS — Z122 Encounter for screening for malignant neoplasm of respiratory organs: Secondary | ICD-10-CM

## 2022-06-22 DIAGNOSIS — Z23 Encounter for immunization: Secondary | ICD-10-CM | POA: Diagnosis not present

## 2022-06-22 DIAGNOSIS — F17200 Nicotine dependence, unspecified, uncomplicated: Secondary | ICD-10-CM

## 2022-06-22 DIAGNOSIS — Z780 Asymptomatic menopausal state: Secondary | ICD-10-CM

## 2022-06-22 DIAGNOSIS — E7849 Other hyperlipidemia: Secondary | ICD-10-CM | POA: Diagnosis not present

## 2022-06-22 DIAGNOSIS — Z720 Tobacco use: Secondary | ICD-10-CM | POA: Diagnosis not present

## 2022-06-22 DIAGNOSIS — G441 Vascular headache, not elsewhere classified: Secondary | ICD-10-CM

## 2022-06-22 DIAGNOSIS — F39 Unspecified mood [affective] disorder: Secondary | ICD-10-CM

## 2022-06-22 MED ORDER — DILTIAZEM HCL ER COATED BEADS 180 MG PO CP24: ORAL_CAPSULE | ORAL | 3 refills | Status: DC

## 2022-06-22 MED ORDER — CITALOPRAM HYDROBROMIDE 20 MG PO TABS: 20.0000 mg | ORAL_TABLET | Freq: Every day | ORAL | 3 refills | Status: DC

## 2022-06-22 MED ORDER — ATORVASTATIN CALCIUM 20 MG PO TABS: 20.0000 mg | ORAL_TABLET | Freq: Every day | ORAL | 3 refills | Status: DC

## 2022-06-22 NOTE — Progress Notes (Signed)
Subjective:    Patient ID: Wendy Crawford, female    DOB: 05/08/1956, 66 y.o.   MRN: [id number]  Patient has a history of severe cluster headaches for which she takes diltiazem to prevent.  This does a good job of controlling and preventing her headaches.  She is also on atorvastatin for hyperlipidemia.  She denies any myalgias or right upper quadrant pain.  She takes Celexa for anxiety.  This helps to manage her stress and her anxiety level and prevent panic attacks.  Unfortunately she is under more stress that she is helping to care for her disabled mother.  She denies any chest pain or shortness of breath or dyspnea on exertion.  Unfortunately she is continuing to smoke.  She is overdue for lung cancer screening.  Her mammogram is up-to-date.  She is due for a bone density test.  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)    Colitis    ulcerative   Depression    Headache(784.0)    most severe for 2 months,cluster   Headaches, cluster    02 and medication   High cholesterol    Irritable bowel    Joint pain    Knee pain    Obesity    Osteoarthritis    Pneumonia YRS AGO   Primary snoring 06/17/2014   Psoriasis    Ulcerative colitis ([place])    Past Surgical History:  Procedure Laterality Date   BREAST BIOPSY Right 1990   BREAST EXCISIONAL BIOPSY Right 1994   JOINT REPLACEMENT     TOTAL HIP ARTHROPLASTY Left 06/08/2016   Procedure: LEFT TOTAL HIP ARTHROPLASTY ANTERIOR APPROACH;  Surgeon: Mcarthur Rossetti, MD;  Location: [facility];  Service: Orthopedics;  Laterality: Left;   TOTAL KNEE ARTHROPLASTY Left 11/07/2018   Procedure: LEFT TOTAL KNEE ARTHROPLASTY;  Surgeon: Mcarthur Rossetti, MD;  Location: [facility];  Service: Orthopedics;  Laterality: Left;   Current Outpatient Medications on File Prior to Visit  Medication Sig Dispense Refill   acetaminophen  (TYLENOL) 500 MG tablet Take 1,000 mg by mouth 2 (two) times daily as needed for moderate pain or headache.      atorvastatin (LIPITOR) 20 MG tablet TAKE 1 TABLET BY MOUTH EVERY DAY 90 tablet 0   buPROPion (WELLBUTRIN SR) 150 MG 12 hr tablet Take 1 tablet (150 mg total) by mouth 2 (two) times daily. 60 tablet 0   citalopram (CELEXA) 20 MG tablet Take 1 tablet (20 mg total) by mouth daily. 90 tablet 3   diltiazem (CARDIZEM CD) 180 MG 24 hr capsule TAKE 1 CAPSULE BY MOUTH EVERY DAY 90 capsule 3   Multiple Vitamins-Minerals (MULTIVITAMIN ADULT) CHEW Chew 2 each by mouth daily.     naproxen sodium (ALEVE) 220 MG tablet Take 220 mg by mouth.     Vitamin D, Ergocalciferol, (DRISDOL) 1.25 MG (50000 UNIT) CAPS capsule Take 1 capsule (50,000 Units total) by mouth every 7 (seven) days. 4 capsule 0   No current facility-administered medications on file prior to visit.   Allergies  Allergen Reactions   Sulfa Antibiotics Itching   Sulfonamide Derivatives Itching   Social History   Socioeconomic History   Marital status: Married    Spouse name: Pilar Plate   Number of children: 0   Years of education: 12   Highest education level: Not on file  Occupational  History   Occupation: self employed   Occupation: [employer]  Tobacco Use   Smoking status: Every Day    Packs/day: 0.50    Years: 42.00    Total pack years: 21.00    Types: Cigarettes   Smokeless tobacco: Never  Vaping Use   Vaping Use: Never used  Substance and Sexual Activity   Alcohol use: No    Alcohol/week: 0.0 standard drinks of alcohol   Drug use: No   Sexual activity: Not on file  Other Topics Concern   Not on file  Social History Narrative   Patient is married [name] and lives at home with her husband.   Patient is working full-time.   Patient has a high school education.   Patient is right-handed.   Patient does not drinks any caffeine.   Social Determinants of Health   Financial Resource Strain: Not on file  Food  Insecurity: Not on file  Transportation Needs: Not on file  Physical Activity: Not on file  Stress: Not on file  Social Connections: Not on file  Intimate Partner Violence: Not on file      Review of Systems  All other systems reviewed and are negative.      Objective:   Physical Exam Vitals reviewed.  Constitutional:      General: She is not in acute distress.    Appearance: She is well-developed. She is obese. She is not ill-appearing or toxic-appearing.  Cardiovascular:     Rate and Rhythm: Normal rate and regular rhythm.     Pulses: Normal pulses.     Heart sounds: Normal heart sounds. No murmur heard.    No friction rub. No gallop.  Pulmonary:     Effort: Pulmonary effort is normal. No respiratory distress.     Breath sounds: Normal breath sounds. No wheezing, rhonchi or rales.  Abdominal:     General: Bowel sounds are normal.     Palpations: Abdomen is soft.  Musculoskeletal:     Cervical back: Neck supple.     Right lower leg: No edema.     Left lower leg: No edema.  Lymphadenopathy:     Cervical: No cervical adenopathy.  Neurological:     General: No focal deficit present.     Mental Status: She is alert and oriented to person, place, and time. Mental status is at baseline.     Cranial Nerves: No cranial nerve deficit.           Assessment & Plan:  Other hyperlipidemia  Postmenopausal estrogen deficiency - Plan: DG Bone Density  Smoking - Plan: CT CHEST LUNG CA SCREEN LOW DOSE W/O CM  Screening for lung cancer - Plan: CT CHEST LUNG CA SCREEN LOW DOSE W/O CM  Mood disorder ([place])- emotional eating  Vascular headache, not intractable - Plan: citalopram (CELEXA) 20 MG tablet  Nicotine abuse - Plan: citalopram (CELEXA) 20 MG tablet I will schedule the patient for CT scan of the lungs to screen for lung cancer.  Strongly recommended smoking cessation.  I will schedule the patient for a bone density test.  Her blood pressure and her cholesterol and  other lab work from April looked excellent.  She received Prevnar 20.  We also discussed Wegovy for weight loss and she will check on the cost

## 2022-06-28 ENCOUNTER — Ambulatory Visit (INDEPENDENT_AMBULATORY_CARE_PROVIDER_SITE_OTHER): Payer: Medicare Other | Admitting: Family Medicine

## 2022-06-28 ENCOUNTER — Encounter (INDEPENDENT_AMBULATORY_CARE_PROVIDER_SITE_OTHER): Payer: Self-pay | Admitting: Family Medicine

## 2022-06-28 VITALS — BP 120/79 | HR 76 | Temp 98.0°F | Ht 60.0 in | Wt 226.0 lb

## 2022-06-28 DIAGNOSIS — E669 Obesity, unspecified: Secondary | ICD-10-CM | POA: Diagnosis not present

## 2022-06-28 DIAGNOSIS — E559 Vitamin D deficiency, unspecified: Secondary | ICD-10-CM

## 2022-06-28 DIAGNOSIS — Z6841 Body Mass Index (BMI) 40.0 and over, adult: Secondary | ICD-10-CM | POA: Diagnosis not present

## 2022-06-28 DIAGNOSIS — F39 Unspecified mood [affective] disorder: Secondary | ICD-10-CM | POA: Diagnosis not present

## 2022-06-28 MED ORDER — VITAMIN D (ERGOCALCIFEROL) 1.25 MG (50000 UNIT) PO CAPS: 50000.0000 [IU] | ORAL_CAPSULE | ORAL | 0 refills | Status: DC

## 2022-07-03 NOTE — Progress Notes (Signed)
Chief Complaint:   OBESITY Wendy Crawford is here to discuss her progress with her obesity treatment plan along with follow-up of her obesity related diagnoses. Wendy Crawford is on the Category 2 Plan and states she is following her eating plan approximately 98% of the time. Wendy Crawford states she is doing PT knee exercises 30 minutes 3-4 times per week.  Today's visit was #: 7 Starting weight: 254 lbs Starting date: 03/20/2022 Today's weight: 224 lbs Today's date: 06/28/2022 Total lbs lost to date: 30 Total lbs lost since last in-office visit: 2  Interim History: Wendy Crawford is here for a follow up office visit.  We reviewed her meal plan and all questions were answered.  Patient's food recall appears to be accurate and consistent with what is on plan when she is following it.   When eating on plan, her hunger and cravings are well controlled.   Pt states, "I am bored and eat when I'm bored." She gained 3.2 lbs of muscle and lost 5.6 lbs in fat mass. Pt provided with recipes and spices handout and discussed [name] sauces.  Subjective:   1. Mood disorder ([name])- emotional eating Wendy Crawford's last OV was with NP [name]. They tried to increase Wellbutrin to BID, but pt did not tolerate it well, as it caused "crazy dreams" and nausea. Pt is not struggling with mood as badly as she was at her last OV.  2. Vitamin D deficiency She is currently taking prescription vitamin D 50,000 IU each week. She denies nausea, vomiting or muscle weakness.  Assessment/Plan:  No orders of the defined types were placed in this encounter.   Medications Discontinued During This Encounter  Medication Reason   Vitamin D, Ergocalciferol, (DRISDOL) 1.25 MG (50000 UNIT) CAPS capsule Reorder     Meds ordered this encounter  Medications   Vitamin D, Ergocalciferol, (DRISDOL) 1.25 MG (50000 UNIT) CAPS capsule    Sig: Take 1 capsule (50,000 Units total) by mouth every 7 (seven) days.    Dispense:  4 capsule    Refill:  0     30 d supply;  ** OV for RF **   Do not send RF request     1. Mood disorder ([name])- emotional eating Behavior modification techniques were discussed today to help Wendy Crawford deal with her emotional/non-hunger eating behaviors.  Orders and follow up as documented in patient record. Pt just started Wellbutrin 05/17/22, and I discussed with pt that Wellbutrin likely has not reached its full affect yet, in terms of helping with mood. Continue current dose and, if needed, we can consider changing med after 8-10 weeks of taking it. Emotional eating techniques discussed with pt.  2. Vitamin D deficiency Low Vitamin D level contributes to fatigue and are associated with obesity, breast, and colon cancer. She agrees to continue to take prescription Vitamin D @50 ,000 IU every week and will follow-up for routine testing of Vitamin D, at least 2-3 times per year to avoid over-replacement.  Refill- Vitamin D, Ergocalciferol, (DRISDOL) 1.25 MG (50000 UNIT) CAPS capsule; Take 1 capsule (50,000 Units total) by mouth every 7 (seven) days.  Dispense: 4 capsule; Refill: 0  3. Obesity, Current BMI 43.8 Wendy Crawford is currently in the action stage of change. As such, her goal is to continue with weight loss efforts. She has agreed to the Category 2 Plan.   Pt is going to [facility] for a week on vacation. Discussed with pt strategies on how to stay on plan. Discussed with  pt behavioral techniques to help with boredom eating.  Exercise goals:  As is  Behavioral modification strategies: meal planning and cooking strategies and planning for success.  Wendy Crawford has agreed to follow-up with our clinic in 3 weeks. She was informed of the importance of frequent follow-up visits to maximize her success with intensive lifestyle modifications for her multiple health conditions.   Objective:   Blood pressure 120/79, pulse 76, temperature 98 F (36.7 C), height 5' (1.524 m), weight 226 lb (102.5 kg), SpO2 95 %. Body mass index is  44.14 kg/m.  General: Cooperative, alert, well developed, in no acute distress. HEENT: Conjunctivae and lids unremarkable. Cardiovascular: Regular rhythm.  Lungs: Normal work of breathing. Neurologic: No focal deficits.   Lab Results  Component Value Date   CREATININE 0.69 03/20/2022   BUN 11 03/20/2022   NA 143 03/20/2022   K 4.9 03/20/2022   CL 104 03/20/2022   CO2 24 03/20/2022   Lab Results  Component Value Date   ALT 19 03/20/2022   AST 15 03/20/2022   ALKPHOS 98 03/20/2022   BILITOT 0.3 03/20/2022   Lab Results  Component Value Date   HGBA1C 5.9 (H) 03/20/2022   Lab Results  Component Value Date   INSULIN 13.9 03/20/2022   Lab Results  Component Value Date   TSH 4.760 (H) 03/20/2022   Lab Results  Component Value Date   CHOL 163 03/20/2022   HDL 66 03/20/2022   LDLCALC 76 03/20/2022   LDLDIRECT 143 (H) 03/15/2020   TRIG 123 03/20/2022   CHOLHDL 3.1 04/11/2021   Lab Results  Component Value Date   VD25OH 28.0 (L) 03/20/2022   Lab Results  Component Value Date   WBC 5.2 03/20/2022   HGB 14.0 03/20/2022   HCT 42.2 03/20/2022   MCV 88 03/20/2022   PLT 221 03/20/2022   No results found for: "IRON", "TIBC", "FERRITIN"  Obesity Behavioral Intervention:   Approximately 15 minutes were spent on the discussion below.  ASK: We discussed the diagnosis of obesity with Wendy Crawford today and Wendy Crawford agreed to give [place] permission to discuss obesity behavioral modification therapy today.  ASSESS: [name] has the diagnosis of obesity and her BMI today is 43.8. Wendy Crawford is in the action stage of change.   ADVISE: Wendy Crawford was educated on the multiple health risks of obesity as well as the benefit of weight loss to improve her health. She was advised of the need for long term treatment and the importance of lifestyle modifications to improve her current health and to decrease her risk of future health problems.  AGREE: Multiple dietary modification options and  treatment options were discussed and Wendy Crawford agreed to follow the recommendations documented in the above note.  ARRANGE: Wendy Crawford was educated on the importance of frequent visits to treat obesity as outlined per CMS and USPSTF guidelines and agreed to schedule her next follow up appointment today.  Attestation Statements:   Reviewed by clinician on day of visit: allergies, medications, problem list, medical history, surgical history, family history, social history, and previous encounter notes.  I, Kathlene November, BS, CMA, am acting as transcriptionist for [employer], DO.   I have reviewed the above documentation for accuracy and completeness, and I agree with the above. Marjory Sneddon, D.O.  The [place] was signed into law in 2016 which includes the topic of electronic health records.  This provides immediate access to information in MyChart.  This includes consultation notes, operative notes, office notes,  lab results and pathology reports.  If you have any questions about what you read please let us know at your next visit so we can discuss your concerns and take corrective action if need be.  We are right here with you.

## 2022-07-09 ENCOUNTER — Other Ambulatory Visit: Payer: Self-pay

## 2022-07-09 DIAGNOSIS — F39 Unspecified mood [affective] disorder: Secondary | ICD-10-CM

## 2022-07-09 NOTE — Telephone Encounter (Signed)
Pharmacy faxed a refill request for buPROPion (WELLBUTRIN SR) 150 MG 12 hr tablet [[id number]]    Order Details Dose: 150 mg Route: Oral Frequency: 2 times daily  Dispense Quantity: 60 tablet Refills: 0   Note to Pharmacy: PT NEEDS ANNUAL VISIT FOR FUTURE REFILLS       Sig: Take 1 tablet (150 mg total) by mouth 2 (two) times daily.       Start Date: 06/21/22 End Date: --  Written Date: 06/21/22 Expiration Date: 06/21/23

## 2022-07-09 NOTE — Telephone Encounter (Signed)
Patient was seen on 06/22/22

## 2022-07-10 ENCOUNTER — Other Ambulatory Visit: Payer: Self-pay

## 2022-07-10 DIAGNOSIS — E7841 Elevated Lipoprotein(a): Secondary | ICD-10-CM

## 2022-07-10 DIAGNOSIS — R0602 Shortness of breath: Secondary | ICD-10-CM

## 2022-07-10 DIAGNOSIS — E7800 Pure hypercholesterolemia, unspecified: Secondary | ICD-10-CM

## 2022-07-10 MED ORDER — ATORVASTATIN CALCIUM 20 MG PO TABS: 20.0000 mg | ORAL_TABLET | Freq: Every day | ORAL | 3 refills | Status: DC

## 2022-07-10 MED ORDER — DILTIAZEM HCL ER COATED BEADS 180 MG PO CP24: ORAL_CAPSULE | ORAL | 3 refills | Status: DC

## 2022-07-10 MED ORDER — BUPROPION HCL ER (SR) 150 MG PO TB12: 150.0000 mg | ORAL_TABLET | Freq: Two times a day (BID) | ORAL | 1 refills | Status: DC

## 2022-07-10 NOTE — Telephone Encounter (Signed)
Requested Prescriptions  Pending Prescriptions Disp Refills  . buPROPion (WELLBUTRIN SR) 150 MG 12 hr tablet 180 tablet 1    Sig: Take 1 tablet (150 mg total) by mouth 2 (two) times daily.     Psychiatry: Antidepressants - bupropion Failed - 07/09/2022  1:21 PM      Failed - Valid encounter within last 6 months    Recent Outpatient Visits          7 months ago [place] Eulogio Bear, NP   1 year ago Pure hypercholesterolemia   [place] Dennard Schaumann, Cammie Mcgee, MD   1 year ago Herpes zoster without complication   [place], [place], FNP   1 year ago Herpes zoster without complication   [place], [place], FNP   2 years ago Screening cholesterol level   [place] Pickard, Cammie Mcgee, MD             Passed - Cr in normal range and within 360 days    Creat  Date Value Ref Range Status  04/11/2021 0.87 0.50 - 0.99 mg/dL Final    Comment:    For patients >25 years of age, the reference limit for Creatinine is approximately 13% higher for people identified as African-American. .    Creatinine, Ser  Date Value Ref Range Status  03/20/2022 0.69 0.57 - 1.00 mg/dL Final         Passed - AST in normal range and within 360 days    AST  Date Value Ref Range Status  03/20/2022 15 0 - 40 IU/L Final         Passed - ALT in normal range and within 360 days    ALT  Date Value Ref Range Status  03/20/2022 19 0 - 32 IU/L Final         Passed - Last BP in normal range    BP Readings from Last 1 Encounters:  06/28/22 120/79

## 2022-07-23 NOTE — Addendum Note (Signed)
Addended by: Colman Cater on: 07/23/2022 12:51 PM   Modules accepted: Orders

## 2022-07-25 ENCOUNTER — Ambulatory Visit
Admission: RE | Admit: 2022-07-25 | Discharge: 2022-07-25 | Disposition: A | Discharge status: Home / Self Care | Payer: Medicare Other | Source: Ambulatory Visit | Attending: Family Medicine | Admitting: Family Medicine

## 2022-07-25 DIAGNOSIS — F17210 Nicotine dependence, cigarettes, uncomplicated: Secondary | ICD-10-CM | POA: Diagnosis not present

## 2022-07-25 DIAGNOSIS — Z122 Encounter for screening for malignant neoplasm of respiratory organs: Secondary | ICD-10-CM

## 2022-07-25 DIAGNOSIS — F17200 Nicotine dependence, unspecified, uncomplicated: Secondary | ICD-10-CM

## 2022-07-26 ENCOUNTER — Encounter (INDEPENDENT_AMBULATORY_CARE_PROVIDER_SITE_OTHER): Payer: Self-pay | Admitting: Nurse Practitioner

## 2022-07-26 ENCOUNTER — Ambulatory Visit (INDEPENDENT_AMBULATORY_CARE_PROVIDER_SITE_OTHER): Payer: Medicare Other | Admitting: Nurse Practitioner

## 2022-07-26 VITALS — BP 120/79 | HR 77 | Temp 98.0°F | Ht 60.0 in | Wt 222.0 lb

## 2022-07-26 DIAGNOSIS — E559 Vitamin D deficiency, unspecified: Secondary | ICD-10-CM | POA: Diagnosis not present

## 2022-07-26 DIAGNOSIS — E038 Other specified hypothyroidism: Secondary | ICD-10-CM | POA: Diagnosis not present

## 2022-07-26 DIAGNOSIS — E669 Obesity, unspecified: Secondary | ICD-10-CM

## 2022-07-26 DIAGNOSIS — E7849 Other hyperlipidemia: Secondary | ICD-10-CM | POA: Diagnosis not present

## 2022-07-26 DIAGNOSIS — R7303 Prediabetes: Secondary | ICD-10-CM | POA: Diagnosis not present

## 2022-07-26 DIAGNOSIS — Z6841 Body Mass Index (BMI) 40.0 and over, adult: Secondary | ICD-10-CM

## 2022-07-26 DIAGNOSIS — F39 Unspecified mood [affective] disorder: Secondary | ICD-10-CM

## 2022-07-26 DIAGNOSIS — E039 Hypothyroidism, unspecified: Secondary | ICD-10-CM

## 2022-07-26 MED ORDER — VITAMIN D (ERGOCALCIFEROL) 1.25 MG (50000 UNIT) PO CAPS: 50000.0000 [IU] | ORAL_CAPSULE | ORAL | 0 refills | Status: DC

## 2022-07-27 LAB — HEMOGLOBIN A1C
Est. average glucose Bld gHb Est-mCnc: 123 mg/dL
Hgb A1c MFr Bld: 5.9 % — ABNORMAL HIGH (ref 4.8–5.6)

## 2022-07-27 LAB — COMPREHENSIVE METABOLIC PANEL
ALT: 13 IU/L (ref 0–32)
AST: 12 IU/L (ref 0–40)
Albumin/Globulin Ratio: 2.1 (ref 1.2–2.2)
Albumin: 4.8 g/dL (ref 3.9–4.9)
Alkaline Phosphatase: 115 IU/L (ref 44–121)
BUN/Creatinine Ratio: 21 (ref 12–28)
BUN: 15 mg/dL (ref 8–27)
Bilirubin Total: 0.3 mg/dL (ref 0.0–1.2)
CO2: 22 mmol/L (ref 20–29)
Calcium: 10.1 mg/dL (ref 8.7–10.3)
Chloride: 104 mmol/L (ref 96–106)
Creatinine, Ser: 0.7 mg/dL (ref 0.57–1.00)
Globulin, Total: 2.3 g/dL (ref 1.5–4.5)
Glucose: 87 mg/dL (ref 70–99)
Potassium: 4.6 mmol/L (ref 3.5–5.2)
Sodium: 141 mmol/L (ref 134–144)
Total Protein: 7.1 g/dL (ref 6.0–8.5)
eGFR: 96 mL/min/{1.73_m2} (ref >59)

## 2022-07-27 LAB — LIPID PANEL WITH LDL/HDL RATIO
Cholesterol, Total: 158 mg/dL (ref 100–199)
HDL: 60 mg/dL (ref >39)
LDL Chol Calc (NIH): 71 mg/dL (ref 0–99)
LDL/HDL Ratio: 1.2 ratio (ref 0.0–3.2)
Triglycerides: 159 mg/dL — ABNORMAL HIGH (ref 0–149)
VLDL Cholesterol Cal: 27 mg/dL (ref 5–40)

## 2022-07-27 LAB — TSH: TSH: 4.14 u[IU]/mL (ref 0.450–4.500)

## 2022-07-27 LAB — T4, FREE: Free T4: 1.3 ng/dL (ref 0.82–1.77)

## 2022-07-27 LAB — INSULIN, RANDOM: INSULIN: 11.8 u[IU]/mL (ref 2.6–24.9)

## 2022-07-27 LAB — T3: T3, Total: 120 ng/dL (ref 71–180)

## 2022-07-27 LAB — VITAMIN D 25 HYDROXY (VIT D DEFICIENCY, FRACTURES): Vit D, 25-Hydroxy: 48 ng/mL (ref 30.0–100.0)

## 2022-08-02 DIAGNOSIS — E7849 Other hyperlipidemia: Secondary | ICD-10-CM | POA: Insufficient documentation

## 2022-08-02 DIAGNOSIS — E038 Other specified hypothyroidism: Secondary | ICD-10-CM | POA: Insufficient documentation

## 2022-08-02 DIAGNOSIS — R7303 Prediabetes: Secondary | ICD-10-CM | POA: Insufficient documentation

## 2022-08-02 NOTE — Progress Notes (Signed)
Chief Complaint:   OBESITY Wendy Crawford is here to discuss her progress with her obesity treatment plan along with follow-up of her obesity related diagnoses. Wendy Crawford is on the Category 2 Plan and states she is following her eating plan approximately 80% of the time. Wendy Crawford states she is not exercising.   Today's visit was #: 8 Starting weight: 254 lbs Starting date: 03/20/2022 Today's weight: 222 lbs Today's date: 07/26/2022 Total lbs lost to date: 30 lbs Total lbs lost since last in-office visit: 4 lbs  Interim History: Surprised she lost weight since since her last visit due to going on vacation. Doing well with Category 2 plan but does not always weigh her meat.  Does not always eat the bread.  Denies hunger.  Eats if bored or stressed.  Working on BMI reduction to be able to have knee surgery.    Subjective:   1. Vitamin D deficiency She is currently taking prescription vitamin D 50,000 IU each week. She denies nausea, vomiting or muscle weakness.  2. Pre-diabetes She has never been on medication.  Family history:  father, paternal grandparents, brother, sister, DMT2.   3. Other hyperlipidemia Taking Lipitor 20 mg.  Denies any side effects.  Medication(s) reviewed. Patient denies myalgias.   The 10-year ASCVD risk score (Arnett DK, et al., 2019) is: 7.7%   Values used to calculate the score:     Age: 66 years     Sex: Female     Is Non-Hispanic African American: No     Diabetic: No     Tobacco smoker: Yes     Systolic Blood Pressure: 941 mmHg     Is BP treated: No     HDL Cholesterol: 60 mg/dL     Total Cholesterol: 158 mg/dL  4. Other specified hypothyroidism Never been on medications.   5. Mood disorder ([place])- emotional eating Taking Wellbutrin SR 150 mg daily.  Denies side effects.  Tried to increase dose but had to decrease back to once per day due to side effects.  Was recently refilled by PCP.     Assessment/Plan:   1. Vitamin D deficiency Low Vitamin D  level contributes to fatigue and are associated with obesity, breast, and colon cancer. She agrees to continue to take prescription Vitamin D @50 ,000 IU every week and will follow-up for routine testing of Vitamin D, at least 2-3 times per year to avoid over-replacement.  Refill- Vitamin D, Ergocalciferol, (DRISDOL) 1.25 MG (50000 UNIT) CAPS capsule; Take 1 capsule (50,000 Units total) by mouth every 7 (seven) days.  Dispense: 4 capsule; Refill: 0  Check labs  - Comprehensive metabolic panel - VITAMIN D 25 Hydroxy (Vit-D Deficiency, Fractures)  2. Pre-diabetes Fatin will continue to work on weight loss, exercise, and decreasing simple carbohydrates to help decrease the risk of diabetes.   Check labs  - Comprehensive metabolic panel - Hemoglobin A1c - Insulin, random  3. Other hyperlipidemia Cardiovascular risk and specific lipid/LDL goals reviewed.  We discussed several lifestyle modifications today and Breigh will continue to work on diet, exercise and weight loss efforts. Orders and follow up as documented in patient record.   Counseling Intensive lifestyle modifications are the first line treatment for this issue. Dietary changes: Increase soluble fiber. Decrease simple carbohydrates. Exercise changes: Moderate to vigorous-intensity aerobic activity 150 minutes per week if tolerated. Lipid-lowering medications: see documented in medical record.  Check labs today.  - Comprehensive metabolic panel - Lipid Panel With LDL/HDL Ratio  4. Other  specified hypothyroidism Patient with long-standing hypothyroidism, on levothyroxine therapy. She appears euthyroid. Orders and follow up as documented in patient record.  Counseling Good thyroid control is important for overall health. Supratherapeutic thyroid levels are dangerous and will not improve weight loss results. The correct way to take levothyroxine is fasting, with water, separated by at least 30 minutes from breakfast, and  separated by more than 4 hours from calcium, iron, multivitamins, acid reflux medications (PPIs).     Check labs today.  - Comprehensive metabolic panel - TSH - T4, free - T3  5. Mood disorder ([place])- emotional eating Continue Wellbutrin SR 150 mg daily.  Side effects discussed.   Check labs.  - Comprehensive metabolic panel  6. Obesity, Current BMI 43.5 Labs obtained today.    Wendy Crawford is currently in the action stage of change. As such, her goal is to continue with weight loss efforts. She has agreed to the Category 2 Plan.   Exercise goals:  As is.  Behavioral modification strategies: increasing lean protein intake, increasing vegetables, and increasing water intake.  Wendy Crawford has agreed to follow-up with our clinic in 4 weeks. She was informed of the importance of frequent follow-up visits to maximize her success with intensive lifestyle modifications for her multiple health conditions.   Objective:   Blood pressure 120/79, pulse 77, temperature 98 F (36.7 C), height 5' (1.524 m), weight 222 lb (100.7 kg), SpO2 97 %. Body mass index is 43.36 kg/m.  General: Cooperative, alert, well developed, in no acute distress. HEENT: Conjunctivae and lids unremarkable. Cardiovascular: Regular rhythm.  Lungs: Normal work of breathing. Neurologic: No focal deficits.   Lab Results  Component Value Date   CREATININE 0.70 07/26/2022   BUN 15 07/26/2022   NA 141 07/26/2022   K 4.6 07/26/2022   CL 104 07/26/2022   CO2 22 07/26/2022   Lab Results  Component Value Date   ALT 13 07/26/2022   AST 12 07/26/2022   ALKPHOS 115 07/26/2022   BILITOT 0.3 07/26/2022   Lab Results  Component Value Date   HGBA1C 5.9 (H) 07/26/2022   HGBA1C 5.9 (H) 03/20/2022   Lab Results  Component Value Date   INSULIN 11.8 07/26/2022   INSULIN 13.9 03/20/2022   Lab Results  Component Value Date   TSH 4.140 07/26/2022   Lab Results  Component Value Date   CHOL 158 07/26/2022   HDL 60  07/26/2022   LDLCALC 71 07/26/2022   LDLDIRECT 143 (H) 03/15/2020   TRIG 159 (H) 07/26/2022   CHOLHDL 3.1 04/11/2021   Lab Results  Component Value Date   VD25OH 48.0 07/26/2022   VD25OH 28.0 (L) 03/20/2022   Lab Results  Component Value Date   WBC 5.2 03/20/2022   HGB 14.0 03/20/2022   HCT 42.2 03/20/2022   MCV 88 03/20/2022   PLT 221 03/20/2022   No results found for: "IRON", "TIBC", "FERRITIN"  Obesity Behavioral Intervention:   Approximately 15 minutes were spent on the discussion below.  ASK: We discussed the diagnosis of obesity with [name] today and Ladeana agreed to give [place] permission to discuss obesity behavioral modification therapy today.  ASSESS: Shaunae has the diagnosis of obesity and her BMI today is 43.5. Chanese is in the action stage of change.   ADVISE: Ellese was educated on the multiple health risks of obesity as well as the benefit of weight loss to improve her health. She was advised of the need for long term treatment and the importance of lifestyle  modifications to improve her current health and to decrease her risk of future health problems.  AGREE: Multiple dietary modification options and treatment options were discussed and Iona agreed to follow the recommendations documented in the above note.  ARRANGE: Amarise was educated on the importance of frequent visits to treat obesity as outlined per CMS and USPSTF guidelines and agreed to schedule her next follow up appointment today.  Attestation Statements:   Reviewed by clinician on day of visit: allergies, medications, problem list, medical history, surgical history, family history, social history, and previous encounter notes.  I, Davy Pique, RMA, am acting as transcriptionist for Everardo Pacific, FNP  I have reviewed the above documentation for accuracy and completeness, and I agree with the above. Everardo Pacific, FNP

## 2022-08-30 ENCOUNTER — Ambulatory Visit (INDEPENDENT_AMBULATORY_CARE_PROVIDER_SITE_OTHER): Payer: Medicare Other | Admitting: Family Medicine

## 2022-08-30 ENCOUNTER — Encounter (INDEPENDENT_AMBULATORY_CARE_PROVIDER_SITE_OTHER): Payer: Self-pay | Admitting: Family Medicine

## 2022-08-30 VITALS — BP 117/76 | HR 72 | Temp 98.1°F | Ht 60.0 in | Wt 218.0 lb

## 2022-08-30 DIAGNOSIS — E559 Vitamin D deficiency, unspecified: Secondary | ICD-10-CM

## 2022-08-30 DIAGNOSIS — E669 Obesity, unspecified: Secondary | ICD-10-CM | POA: Diagnosis not present

## 2022-08-30 DIAGNOSIS — M1711 Unilateral primary osteoarthritis, right knee: Secondary | ICD-10-CM

## 2022-08-30 DIAGNOSIS — R7303 Prediabetes: Secondary | ICD-10-CM

## 2022-08-30 DIAGNOSIS — Z6841 Body Mass Index (BMI) 40.0 and over, adult: Secondary | ICD-10-CM

## 2022-08-30 MED ORDER — VITAMIN D (ERGOCALCIFEROL) 1.25 MG (50000 UNIT) PO CAPS: 50000.0000 [IU] | ORAL_CAPSULE | ORAL | 0 refills | Status: DC

## 2022-09-04 DIAGNOSIS — E66813 Obesity, class 3: Secondary | ICD-10-CM | POA: Insufficient documentation

## 2022-09-04 DIAGNOSIS — M1711 Unilateral primary osteoarthritis, right knee: Secondary | ICD-10-CM | POA: Insufficient documentation

## 2022-09-04 DIAGNOSIS — R7303 Prediabetes: Secondary | ICD-10-CM | POA: Insufficient documentation

## 2022-09-04 NOTE — Progress Notes (Unsigned)
Chief Complaint:   OBESITY Wendy Crawford is here to discuss her progress with her obesity treatment plan along with follow-up of her obesity related diagnoses. Wendy Crawford is on the Category 2 Plan and states she is following her eating plan approximately 75% of the time. Wendy Crawford states she has not been exercising.  Today's visit was #: 9 Starting weight: 254 lbs Starting date: 03/20/2022 Today's weight: 218 lbs Today's date: 08/30/22 Total lbs lost to date: 36 Total lbs lost since last in-office visit: -4  Interim History: She is getting tired of the meal plan.  She admits to poor food choices, struggling with meal planning.  She denies large portion sizes.  She has some cravings.  Subjective:   1. Prediabetes Never on metformin; A1c unchanged at 5.9.  2. Vitamin D deficiency Vitamin D level improving to 48 from 28. On prescription vitamin D 50,000 IU weekly.  3. Osteoarthritis of right knee, unspecified osteoarthritis type Awaiting right total knee replacement-needing BMI less than 40. Pain limits exercise.   Assessment/Plan:   1. Prediabetes Continue to work on low sugar intake, consider start of metformin.  2. Vitamin D deficiency Refill: - Vitamin D, Ergocalciferol, (DRISDOL) 1.25 MG (50000 UNIT) CAPS capsule; Take 1 capsule (50,000 Units total) by mouth every 7 (seven) days.  Dispense: 5 capsule; Refill: 0 Low Vitamin D level contributes to fatigue and are associated with obesity, breast, and colon cancer. She agrees to continue to take prescription Vitamin D @50 ,000 IU every week and will follow-up for routine testing of Vitamin D, at least 2-3 times per year to avoid over-replacement.   3. Osteoarthritis of right knee, unspecified osteoarthritis type Continue active plan for weight loss. Exercise as tolerated.  4. Obesity, current BMI 42.7 1.  Change to journaling-ensuring eating 3-4 times per day with protein and fiber with meals. 2.  Think about options for increased  physical activity.  Wendy Crawford is currently in the action stage of change. As such, her goal is to continue with weight loss efforts. She has agreed to keeping a food journal and adhering to recommended goals of 1200 calories and 85 grams protein daily.   Exercise goals: All adults should avoid inactivity. Some physical activity is better than none, and adults who participate in any amount of physical activity gain some health benefits.  Behavioral modification strategies: increasing lean protein intake, increasing vegetables, increasing water intake, decreasing eating out, no skipping meals, emotional eating strategies, and decreasing junk food.  Wendy Crawford has agreed to follow-up with our clinic in 4 weeks. She was informed of the importance of frequent follow-up visits to maximize her success with intensive lifestyle modifications for her multiple health conditions.    Objective:   Blood pressure 117/76, pulse 72, temperature 98.1 F (36.7 C), height 5' (1.524 m), weight 218 lb (98.9 kg), SpO2 95 %. Body mass index is 42.58 kg/m.  General: Cooperative, alert, well developed, in no acute distress. HEENT: Conjunctivae and lids unremarkable. Cardiovascular: Regular rhythm.  Lungs: Normal work of breathing. Neurologic: No focal deficits.   Lab Results  Component Value Date   CREATININE 0.70 07/26/2022   BUN 15 07/26/2022   NA 141 07/26/2022   K 4.6 07/26/2022   CL 104 07/26/2022   CO2 22 07/26/2022   Lab Results  Component Value Date   ALT 13 07/26/2022   AST 12 07/26/2022   ALKPHOS 115 07/26/2022   BILITOT 0.3 07/26/2022   Lab Results  Component Value Date   HGBA1C 5.9 (  H) 07/26/2022   HGBA1C 5.9 (H) 03/20/2022   Lab Results  Component Value Date   INSULIN 11.8 07/26/2022   INSULIN 13.9 03/20/2022   Lab Results  Component Value Date   TSH 4.140 07/26/2022   Lab Results  Component Value Date   CHOL 158 07/26/2022   HDL 60 07/26/2022   LDLCALC 71 07/26/2022    LDLDIRECT 143 (H) 03/15/2020   TRIG 159 (H) 07/26/2022   CHOLHDL 3.1 04/11/2021   Lab Results  Component Value Date   VD25OH 48.0 07/26/2022   VD25OH 28.0 (L) 03/20/2022   Lab Results  Component Value Date   WBC 5.2 03/20/2022   HGB 14.0 03/20/2022   HCT 42.2 03/20/2022   MCV 88 03/20/2022   PLT 221 03/20/2022   No results found for: "IRON", "TIBC", "FERRITIN"  Attestation Statements:   Reviewed by clinician on day of visit: allergies, medications, problem list, medical history, surgical history, family history, social history, and previous encounter notes.  I, Georgianne Fick, FNP, am acting as transcriptionist for Dr. Loyal Gambler.  I have reviewed the above documentation for accuracy and completeness, and I agree with the above. Dell Ponto, DO

## 2022-09-14 ENCOUNTER — Other Ambulatory Visit: Payer: Self-pay | Admitting: Family Medicine

## 2022-09-14 DIAGNOSIS — E7841 Elevated Lipoprotein(a): Secondary | ICD-10-CM

## 2022-09-14 DIAGNOSIS — E7800 Pure hypercholesterolemia, unspecified: Secondary | ICD-10-CM

## 2022-09-20 ENCOUNTER — Other Ambulatory Visit: Payer: Self-pay | Admitting: Family Medicine

## 2022-09-20 DIAGNOSIS — R0602 Shortness of breath: Secondary | ICD-10-CM

## 2022-09-20 NOTE — Telephone Encounter (Signed)
Requested Prescriptions  Pending Prescriptions Disp Refills  . diltiazem (CARDIZEM CD) 180 MG 24 hr capsule [Pharmacy Med Name: DILTIAZEM 24H ER(CD) 180 MG CP] 90 capsule 3    Sig: TAKE 1 CAPSULE BY MOUTH EVERY DAY     Cardiovascular: Calcium Channel Blockers 3 Failed - 09/20/2022  3:32 AM      Failed - Valid encounter within last 6 months    Recent Outpatient Visits          10 months ago [place] Wendy Bear, NP   1 year ago Pure hypercholesterolemia   [place] Dennard Schaumann, Cammie Mcgee, MD   2 years ago Herpes zoster without complication   [place], [place], FNP   2 years ago Herpes zoster without complication   [place], [place], FNP   2 years ago Screening cholesterol level   [place] Pickard, Cammie Mcgee, MD             Passed - ALT in normal range and within 360 days    ALT  Date Value Ref Range Status  07/26/2022 13 0 - 32 IU/L Final         Passed - AST in normal range and within 360 days    AST  Date Value Ref Range Status  07/26/2022 12 0 - 40 IU/L Final         Passed - Cr in normal range and within 360 days    Creat  Date Value Ref Range Status  04/11/2021 0.87 0.50 - 0.99 mg/dL Final    Comment:    For patients >47 years of age, the reference limit for Creatinine is approximately 13% higher for people identified as African-American. .    Creatinine, Ser  Date Value Ref Range Status  07/26/2022 0.70 0.57 - 1.00 mg/dL Final         Passed - Last BP in normal range    BP Readings from Last 1 Encounters:  08/30/22 117/76         Passed - Last Heart Rate in normal range    Pulse Readings from Last 1 Encounters:  08/30/22 72

## 2022-10-04 ENCOUNTER — Ambulatory Visit (INDEPENDENT_AMBULATORY_CARE_PROVIDER_SITE_OTHER): Payer: Medicare Other | Admitting: Family Medicine

## 2022-10-04 ENCOUNTER — Encounter (INDEPENDENT_AMBULATORY_CARE_PROVIDER_SITE_OTHER): Payer: Self-pay | Admitting: Family Medicine

## 2022-10-04 VITALS — BP 117/79 | HR 68 | Temp 97.8°F | Ht 60.0 in | Wt 214.0 lb

## 2022-10-04 DIAGNOSIS — R7303 Prediabetes: Secondary | ICD-10-CM | POA: Diagnosis not present

## 2022-10-04 DIAGNOSIS — Z6841 Body Mass Index (BMI) 40.0 and over, adult: Secondary | ICD-10-CM

## 2022-10-04 DIAGNOSIS — E669 Obesity, unspecified: Secondary | ICD-10-CM | POA: Diagnosis not present

## 2022-10-04 DIAGNOSIS — M1711 Unilateral primary osteoarthritis, right knee: Secondary | ICD-10-CM

## 2022-10-04 DIAGNOSIS — E559 Vitamin D deficiency, unspecified: Secondary | ICD-10-CM

## 2022-10-04 MED ORDER — VITAMIN D (ERGOCALCIFEROL) 1.25 MG (50000 UNIT) PO CAPS: 50000.0000 [IU] | ORAL_CAPSULE | ORAL | 0 refills | Status: DC

## 2022-10-17 NOTE — Progress Notes (Unsigned)
Chief Complaint:   OBESITY Wendy Crawford is here to discuss her progress with her obesity treatment plan along with follow-up of her obesity related diagnoses. [name] is on keeping a food journal and adhering to recommended goals of 1200 calories and 85 protein and states she is following her eating plan approximately 50% of the time. [name] states she is leg exercise 20 minutes 3 times per week.  Today's visit was #: 10 Starting weight: 254 lbs Starting date: 03/20/2022 Today's weight: 214 lbs Today's date: 10/04/2022 Total lbs lost to date: 40 lbs Total lbs lost since last in-office visit: 4 lbs  Interim History: Her mother has passed away in the past week.  Family is supportive.  Mindful of food choices.  She is liking greek yogurt.  She is drinking water.  She is trying to get BMI of <40 for right total knee replacement. Exercise is limited due to right knee pain.   Subjective:   1. Osteoarthritis of right knee, unspecified osteoarthritis type Right knee pain limits exercise.  She is able to do short walks, home PT exercises.  She needs BMI <40 for right total knee replacement.    2. Vitamin D deficiency She is currently taking prescription vitamin D 50,000 IU each week. She denies nausea, vomiting or muscle weakness.  3. Pre-diabetes Last A1c 5.9. Actively working on decreasing sugar, starch intake and weight loss.    Assessment/Plan:   1. Osteoarthritis of right knee, unspecified osteoarthritis type BMI down to 41, continue weight loss and follow up with ortho once BMI <40.   2. Vitamin D deficiency Recheck Vitamin D level in December.   Refill - Vitamin D, Ergocalciferol, (DRISDOL) 1.25 MG (50000 UNIT) CAPS capsule; Take 1 capsule (50,000 Units total) by mouth every 7 (seven) days.  Dispense: 5 capsule; Refill: 0  3. Pre-diabetes Recheck A1c in December, consider use of metformin.   4. Obesity, current BMI 41.9 1) Bands given 2) Reviewed 6 month results: she is down  40 lbs (32 lbs body fat, decreased 7 lbs muscle mass) decreased 15.7% total body weight loss.   [name] is currently in the action stage of change. As such, her goal is to continue with weight loss efforts. She has agreed to keeping a food journal and adhering to recommended goals of 1200 calories and 85 protein daily.    Exercise goals: All adults should avoid inactivity. Some physical activity is better than none, and adults who participate in any amount of physical activity gain some health benefits. Add bands at home.   Behavioral modification strategies: increasing lean protein intake, increasing vegetables, increasing water intake, decreasing eating out, no skipping meals, keeping healthy foods in the home, emotional eating strategies, planning for success, and decreasing junk food.  Emelda has agreed to follow-up with our clinic in 4 weeks. She was informed of the importance of frequent follow-up visits to maximize her success with intensive lifestyle modifications for her multiple health conditions.   Objective:   Blood pressure 117/79, pulse 68, temperature 97.8 F (36.6 C), height 5' (1.524 m), weight 214 lb (97.1 kg), SpO2 97 %. Body mass index is 41.79 kg/m.  General: Cooperative, alert, well developed, in no acute distress. HEENT: Conjunctivae and lids unremarkable. Cardiovascular: Regular rhythm.  Lungs: Normal work of breathing. Neurologic: No focal deficits.   Lab Results  Component Value Date   CREATININE 0.70 07/26/2022   BUN 15 07/26/2022   NA 141 07/26/2022   K 4.6 07/26/2022  CL 104 07/26/2022   CO2 22 07/26/2022   Lab Results  Component Value Date   ALT 13 07/26/2022   AST 12 07/26/2022   ALKPHOS 115 07/26/2022   BILITOT 0.3 07/26/2022   Lab Results  Component Value Date   HGBA1C 5.9 (H) 07/26/2022   HGBA1C 5.9 (H) 03/20/2022   Lab Results  Component Value Date   INSULIN 11.8 07/26/2022   INSULIN 13.9 03/20/2022   Lab Results  Component Value  Date   TSH 4.140 07/26/2022   Lab Results  Component Value Date   CHOL 158 07/26/2022   HDL 60 07/26/2022   LDLCALC 71 07/26/2022   LDLDIRECT 143 (H) 03/15/2020   TRIG 159 (H) 07/26/2022   CHOLHDL 3.1 04/11/2021   Lab Results  Component Value Date   VD25OH 48.0 07/26/2022   VD25OH 28.0 (L) 03/20/2022   Lab Results  Component Value Date   WBC 5.2 03/20/2022   HGB 14.0 03/20/2022   HCT 42.2 03/20/2022   MCV 88 03/20/2022   PLT 221 03/20/2022   No results found for: "IRON", "TIBC", "FERRITIN"  Attestation Statements:   Reviewed by clinician on day of visit: allergies, medications, problem list, medical history, surgical history, family history, social history, and previous encounter notes.  I, Davy Pique, am acting as Location manager for Loyal Gambler, DO.  I have reviewed the above documentation for accuracy and completeness, and I agree with the above. Dell Ponto, DO

## 2022-10-31 ENCOUNTER — Ambulatory Visit: Payer: Medicare Other | Admitting: Orthopaedic Surgery

## 2022-10-31 ENCOUNTER — Encounter: Payer: Self-pay | Admitting: Orthopaedic Surgery

## 2022-10-31 ENCOUNTER — Ambulatory Visit (INDEPENDENT_AMBULATORY_CARE_PROVIDER_SITE_OTHER): Payer: Medicare Other

## 2022-10-31 VITALS — Ht 60.0 in | Wt 219.0 lb

## 2022-10-31 DIAGNOSIS — Z6841 Body Mass Index (BMI) 40.0 and over, adult: Secondary | ICD-10-CM

## 2022-10-31 DIAGNOSIS — G8929 Other chronic pain: Secondary | ICD-10-CM

## 2022-10-31 DIAGNOSIS — M1711 Unilateral primary osteoarthritis, right knee: Secondary | ICD-10-CM

## 2022-10-31 DIAGNOSIS — M25561 Pain in right knee: Secondary | ICD-10-CM

## 2022-10-31 MED ORDER — METHYLPREDNISOLONE ACETATE 40 MG/ML IJ SUSP
40.0000 mg | INTRAMUSCULAR | Status: AC | PRN
  Administered 2022-10-31: 40 mg via INTRA_ARTICULAR

## 2022-10-31 MED ORDER — LIDOCAINE HCL 1 % IJ SOLN
3.0000 mL | INTRAMUSCULAR | Status: AC | PRN
  Administered 2022-10-31: 3 mL

## 2022-10-31 NOTE — Progress Notes (Signed)
Office Visit Note   Patient: Wendy Crawford           Date of Birth: 05/05/1956           MRN: [id number] Visit Date: 10/31/2022              Requested by: Wendy Frizzle, MD [id number] [place],  [place] [id number] PCP: Wendy Frizzle, MD   Assessment & Plan: Visit Diagnoses:  1. Chronic pain of right knee   2. Unilateral primary osteoarthritis, right knee   3. Class 3 severe obesity with body mass index (BMI) of 40.0 to 44.9 in adult, unspecified obesity type, unspecified whether serious comorbidity present [facility])     Plan: She understands she needs to get a BMI close to 39.5 before we can schedule her surgery.  She will work on weight loss.  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  She tolerated the cortisone injection well today.  We will see her back in 3 months sooner if there is any questions concerns.  At return we will obtain a height and weight on her.  Follow-Up Instructions: Return in about 3 months (around 01/31/2023).   Orders:  Orders Placed This Encounter  Procedures   Large Joint Inj   XR Knee 1-2 Views Right   No orders of the defined types were placed in this encounter.     Procedures: Large Joint Inj: R knee on 10/31/2022 11:59 AM Indications: pain Details: 22 G 1.5 in needle, anterolateral approach  Arthrogram: No  Medications: 3 mL lidocaine 1 %; 40 mg methylPREDNISolone acetate 40 MG/ML Outcome: tolerated well, no immediate complications Procedure, treatment alternatives, risks and benefits explained, specific risks discussed. Consent was given by the patient. Immediately prior to procedure a time out was called to verify the correct patient, procedure, equipment, support staff and site/side marked as required. Patient was prepped and draped in the usual sterile fashion.       Clinical Data: No additional findings.   Subjective: Chief Complaint  Patient presents with   Right Knee - Pain    HPI Wendy Crawford returns  today due to right knee pain.  She is ambulating with a walker.  She states few days ago she did something she feels while she was at the bank to injured her right knee.  She has known right knee arthritis.  She states that she is having difficulty bearing full weight on the knee at this point time.  She notes no particular injury though.  She is working on weight loss in hopes of having total knee arthroplasty.  She had a prior left total knee arthroplasty that she is happy with.  She is working with the weight loss clinic on losing weight but notes since her mother died recently that she is gaining some weight back.  Review of Systems  Constitutional:  Negative for chills and fever.     Objective: Vital Signs: Ht 5' (1.524 m)   Wt 219 lb (99.3 kg)   BMI 42.77 kg/m   Physical Exam Constitutional:      Appearance: She is not ill-appearing or diaphoretic.  Pulmonary:     Effort: Pulmonary effort is normal.  Neurological:     Mental Status: She is alert and oriented to person, place, and time.  Psychiatric:        Mood and Affect: Mood normal.     Ortho Exam Right knee no abnormal warmth erythema or ecchymosis.  Global tenderness.  Patellofemoral crepitus with passive range of motion. Specialty Comments:  No specialty comments available.  Imaging: XR Knee 1-2 Views Right  Result Date: 10/31/2022 Right knee 2 views: No acute fractures.  Severe end-stage tricompartmental arthritis.  Knee is well located.  Normal bone density.    PMFS History: Patient Active Problem List   Diagnosis Date Noted   Class 3 severe obesity with serious comorbidity and body mass index (BMI) of 45.0 to 49.9 in adult [facility]) 09/04/2022   Prediabetes 09/04/2022   Osteoarthritis of right knee 09/04/2022   Pre-diabetes 08/02/2022   Other hyperlipidemia 08/02/2022   Other specified hypothyroidism 08/02/2022   Caregiver stress syndrome - emotional eating 05/31/2022   Vitamin D deficiency 05/31/2022    Blood glucose elevated 03/20/2022   Elevated lipoprotein(a) 03/20/2022   Mood disorder ([place]) 03/20/2022   Status post total left knee replacement 11/07/2018   Primary osteoarthritis of both knees 03/26/2018   Chronic pain of both knees 07/01/2017   Unilateral primary osteoarthritis, left knee 07/01/2017   Unilateral primary osteoarthritis, right knee 07/01/2017   Osteoarthritis of left hip 06/08/2016   Status post total replacement of left hip 06/08/2016   Chronic cluster headache, not intractable 09/13/2015   COPD exacerbation ([place]) 09/13/2015   Primary snoring 06/17/2014   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)    Episodic cluster headache    RECTAL BLEEDING 12/02/2007   DIARRHEA 12/02/2007   ABDOMINAL PAIN RIGHT LOWER QUADRANT 12/02/2007   COLITIS, ULCERATIVE 06/23/2007   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)    Colitis    ulcerative   Depression    Headache(784.0)    most severe for 2 months,cluster   Headaches, cluster    02 and medication   High cholesterol    Irritable bowel    Joint pain    Knee pain    Obesity    Osteoarthritis    Pneumonia YRS AGO   Primary snoring 06/17/2014   Psoriasis    Ulcerative colitis ([place])     Family History  Problem Relation Age of Onset   Hypertension Mother    High Cholesterol Mother    Stroke Mother    Diabetes Father    Thyroid disease Father    Obesity Father    Diabetes Sister    Diabetes Brother    Heart disease Maternal Grandmother    Heart disease Maternal Grandfather    Colon cancer Neg Hx     Past Surgical History:  Procedure Laterality Date   BREAST BIOPSY Right 1990   BREAST EXCISIONAL BIOPSY Right 1994   JOINT REPLACEMENT     TOTAL HIP ARTHROPLASTY Left 06/08/2016   Procedure: LEFT TOTAL HIP ARTHROPLASTY ANTERIOR APPROACH;  Surgeon: Mcarthur Rossetti, MD;  Location: [facility];   Service: Orthopedics;  Laterality: Left;   TOTAL KNEE ARTHROPLASTY Left 11/07/2018   Procedure: LEFT TOTAL KNEE ARTHROPLASTY;  Surgeon: Mcarthur Rossetti, MD;  Location: [facility];  Service: Orthopedics;  Laterality: Left;   Social History   Occupational History   Occupation: self employed   Occupation: [employer]  Tobacco Use   Smoking status: Every Day    Packs/day: 0.50    Years: 42.00    Total pack years: 21.00    Types: Cigarettes   Smokeless tobacco: Never  Vaping Use   Vaping Use: Never used  Substance and Sexual  Activity   Alcohol use: No    Alcohol/week: 0.0 standard drinks of alcohol   Drug use: No   Sexual activity: Not on file

## 2022-11-07 ENCOUNTER — Encounter (INDEPENDENT_AMBULATORY_CARE_PROVIDER_SITE_OTHER): Payer: Self-pay | Admitting: Family Medicine

## 2022-11-07 ENCOUNTER — Ambulatory Visit (INDEPENDENT_AMBULATORY_CARE_PROVIDER_SITE_OTHER): Payer: Medicare Other | Admitting: Family Medicine

## 2022-11-07 VITALS — BP 133/83 | HR 54 | Temp 98.0°F | Ht 60.0 in | Wt 214.4 lb

## 2022-11-07 DIAGNOSIS — E669 Obesity, unspecified: Secondary | ICD-10-CM

## 2022-11-07 DIAGNOSIS — E559 Vitamin D deficiency, unspecified: Secondary | ICD-10-CM | POA: Diagnosis not present

## 2022-11-07 DIAGNOSIS — F39 Unspecified mood [affective] disorder: Secondary | ICD-10-CM | POA: Diagnosis not present

## 2022-11-07 DIAGNOSIS — F5102 Adjustment insomnia: Secondary | ICD-10-CM | POA: Diagnosis not present

## 2022-11-07 DIAGNOSIS — Z6841 Body Mass Index (BMI) 40.0 and over, adult: Secondary | ICD-10-CM

## 2022-11-07 MED ORDER — BUPROPION HCL ER (SR) 150 MG PO TB12: ORAL_TABLET | ORAL | 1 refills | Status: DC

## 2022-11-07 MED ORDER — TRAZODONE HCL 100 MG PO TABS: 100.0000 mg | ORAL_TABLET | Freq: Every evening | ORAL | 0 refills | Status: DC | PRN

## 2022-11-07 MED ORDER — VITAMIN D (ERGOCALCIFEROL) 1.25 MG (50000 UNIT) PO CAPS: 50000.0000 [IU] | ORAL_CAPSULE | ORAL | 0 refills | Status: DC

## 2022-11-21 NOTE — Progress Notes (Signed)
Chief Complaint:   OBESITY Wendy Crawford Crawford here to discuss her progress with her obesity treatment plan along with follow-up of her obesity related diagnoses. Wendy Crawford Crawford on keeping a food journal and adhering to recommended goals of 1200 calories and 85 protein and states she Crawford following her eating plan approximately 50% of the time. Wendy Crawford states she Crawford leg lifts 30 minutes 3 times per week.  Today's visit was #: 32 Starting weight: 254 LBS Starting date: 03/20/2022 Today's weight: 214 LBS Today's date: 11/07/2022 Total lbs lost to date: 40 LBS Total lbs lost since last in-office visit: 0  Interim History: Patient went to Ortho doctor, got knee injection November 29.  Patient needs a BMI of less than 40 for replacement.  Patient lost her mom at age 74 at the end of October.  She Crawford very tearful in the office today.  She Crawford not sleeping well, this Crawford her first office visit with me) and her mom.  Subjective:   1. Adjustment insomnia Patient lost her mother September 23, 2022.  2. Vitamin D deficiency Vitamin D Crawford at goal about 5 months ago.  3. Mood disorder ([place])- emotional eating Patient not taking Wellbutrin twice daily, she Crawford taking Celexa 20 mg per PCP.  Assessment/Plan:  No orders of the defined types were placed in this encounter.   Medications Discontinued During This Encounter  Medication Reason   buPROPion (WELLBUTRIN SR) 150 MG 12 hr tablet Reorder   Vitamin D, Ergocalciferol, (DRISDOL) 1.25 MG (50000 UNIT) CAPS capsule Reorder     Meds ordered this encounter  Medications   Vitamin D, Ergocalciferol, (DRISDOL) 1.25 MG (50000 UNIT) CAPS capsule    Sig: Take 1 capsule (50,000 Units total) by mouth every 7 (seven) days.    Dispense:  5 capsule    Refill:  0    30 d supply;  ** OV for RF **   Do not send RF request   buPROPion (WELLBUTRIN SR) 150 MG 12 hr tablet    Sig: 1 po q am, and 1 po q mid-afternoon    Dispense:  180 tablet    Refill:  1    PT NEEDS ANNUAL  VISIT FOR FUTURE REFILLS   traZODone (DESYREL) 100 MG tablet    Sig: Take 1 tablet (100 mg total) by mouth at bedtime Wendy needed for sleep.    Dispense:  30 tablet    Refill:  0     1. Adjustment insomnia Start trazodone half tab 50 mg nightly and increase to 1 tablet nightly Wendy needed. Sleep hygiene discussed with patient.  Start- traZODone (DESYREL) 100 MG tablet; Take 1 tablet (100 mg total) by mouth at bedtime Wendy needed for sleep.  Dispense: 30 tablet; Refill: 0  2. Vitamin D deficiency Low Vitamin D level contributes to fatigue and are associated with obesity, breast, and colon cancer. She agrees to continue to take prescription Vitamin D @50 ,000 IU every week and will follow-up for routine testing of Vitamin D, at least 2-3 times per year to avoid over-replacement. Consider recheck levels next office visit.  Refill- Vitamin D, Ergocalciferol, (DRISDOL) 1.25 MG (50000 UNIT) CAPS capsule; Take 1 capsule (50,000 Units total) by mouth every 7 (seven) days.  Dispense: 5 capsule; Refill: 0  3. Mood disorder ([place])- emotional eating Advised patient to take one Wellbutrin 150 mg every morning and one in the mid afternoon (twice daily).  Not just one tablet daily.  Self-care activities discussed with patient.  Refill- buPROPion (WELLBUTRIN SR) 150 MG 12 hr tablet; 1 po q am, and 1 po q mid-afternoon  Dispense: 180 tablet; Refill: 1  4. Obesity, current BMI 41.9 Wendy Crawford Crawford currently in the action stage of change. Wendy such, her goal Crawford to continue with weight loss efforts. She has agreed to keeping a food journal and adhering to recommended goals of 1200 calories and 85 protein.   Exercise goals:  Wendy Crawford, increase activity Wendy tolerated.  Behavioral modification strategies: holiday eating strategies .  Wendy Crawford has agreed to follow-up with our clinic in 4 weeks. She was informed of the importance of frequent follow-up visits to maximize her success with intensive lifestyle modifications for her  multiple health conditions.   Objective:   Blood pressure 133/83, pulse (!) 54, temperature 98 F (36.7 C), height 5' (1.524 m), weight 214 lb 6.4 oz (97.3 kg), SpO2 100 %. Body mass index Crawford 41.87 kg/m.  General: Cooperative, alert, well developed, in no acute distress. HEENT: Conjunctivae and lids unremarkable. Cardiovascular: Regular rhythm.  Lungs: Normal work of breathing. Neurologic: No focal deficits.   Lab Results  Component Value Date   CREATININE 0.70 07/26/2022   BUN 15 07/26/2022   NA 141 07/26/2022   K 4.6 07/26/2022   CL 104 07/26/2022   CO2 22 07/26/2022   Lab Results  Component Value Date   ALT 13 07/26/2022   AST 12 07/26/2022   ALKPHOS 115 07/26/2022   BILITOT 0.3 07/26/2022   Lab Results  Component Value Date   HGBA1C 5.9 (H) 07/26/2022   HGBA1C 5.9 (H) 03/20/2022   Lab Results  Component Value Date   INSULIN 11.8 07/26/2022   INSULIN 13.9 03/20/2022   Lab Results  Component Value Date   TSH 4.140 07/26/2022   Lab Results  Component Value Date   CHOL 158 07/26/2022   HDL 60 07/26/2022   LDLCALC 71 07/26/2022   LDLDIRECT 143 (H) 03/15/2020   TRIG 159 (H) 07/26/2022   CHOLHDL 3.1 04/11/2021   Lab Results  Component Value Date   VD25OH 48.0 07/26/2022   VD25OH 28.0 (L) 03/20/2022   Lab Results  Component Value Date   WBC 5.2 03/20/2022   HGB 14.0 03/20/2022   HCT 42.2 03/20/2022   MCV 88 03/20/2022   PLT 221 03/20/2022   No results found for: "IRON", "TIBC", "FERRITIN"  Attestation Statements:   Reviewed by clinician on day of visit: allergies, medications, problem list, medical history, surgical history, family history, social history, and previous encounter notes.  I, Davy Pique, RMA, am acting Wendy Location manager for [employer], DO.  I have reviewed the above documentation for accuracy and completeness, and I agree with the above. Marjory Sneddon, D.O.  The [place] was signed into law in 2016 which  includes the topic of electronic health records.  This provides immediate access to information in MyChart.  This includes consultation notes, operative notes, office notes, lab results and pathology reports.  If you have any questions about what you read please let us know at your next visit so we can discuss your concerns and take corrective action if need be.  We are right here with you.

## 2022-11-30 ENCOUNTER — Other Ambulatory Visit (INDEPENDENT_AMBULATORY_CARE_PROVIDER_SITE_OTHER): Payer: Self-pay | Admitting: Family Medicine

## 2022-11-30 DIAGNOSIS — F5102 Adjustment insomnia: Secondary | ICD-10-CM

## 2022-12-10 ENCOUNTER — Encounter (INDEPENDENT_AMBULATORY_CARE_PROVIDER_SITE_OTHER): Payer: Self-pay | Admitting: Family Medicine

## 2022-12-10 ENCOUNTER — Ambulatory Visit (INDEPENDENT_AMBULATORY_CARE_PROVIDER_SITE_OTHER): Payer: Medicare Other | Admitting: Family Medicine

## 2022-12-10 VITALS — BP 119/83 | HR 67 | Temp 98.1°F | Ht 60.0 in | Wt 216.6 lb

## 2022-12-10 DIAGNOSIS — E669 Obesity, unspecified: Secondary | ICD-10-CM

## 2022-12-10 DIAGNOSIS — F5102 Adjustment insomnia: Secondary | ICD-10-CM

## 2022-12-10 DIAGNOSIS — F39 Unspecified mood [affective] disorder: Secondary | ICD-10-CM

## 2022-12-10 DIAGNOSIS — E559 Vitamin D deficiency, unspecified: Secondary | ICD-10-CM

## 2022-12-10 DIAGNOSIS — Z6841 Body Mass Index (BMI) 40.0 and over, adult: Secondary | ICD-10-CM

## 2022-12-10 MED ORDER — VITAMIN D (ERGOCALCIFEROL) 1.25 MG (50000 UNIT) PO CAPS: 50000.0000 [IU] | ORAL_CAPSULE | ORAL | 0 refills | Status: DC

## 2022-12-14 ENCOUNTER — Ambulatory Visit
Admission: RE | Admit: 2022-12-14 | Discharge: 2022-12-14 | Disposition: A | Discharge status: Home / Self Care | Payer: Medicare Other | Source: Ambulatory Visit | Attending: Family Medicine | Admitting: Family Medicine

## 2022-12-14 DIAGNOSIS — M85851 Other specified disorders of bone density and structure, right thigh: Secondary | ICD-10-CM | POA: Diagnosis not present

## 2022-12-14 DIAGNOSIS — Z780 Asymptomatic menopausal state: Secondary | ICD-10-CM

## 2022-12-26 NOTE — Progress Notes (Signed)
Chief Complaint:   OBESITY Wendy Crawford is here to discuss her progress with her obesity treatment plan along with follow-up of her obesity related diagnoses. Wendy Crawford is on the Category 2 Plan and states she is following her eating plan approximately 50% of the time. Wendy Crawford states she is leg 15-20 minutes 3 times per week.  Today's visit was #: 12 Starting weight: 254 lbs Starting date: 03/20/2022 Today's weight: 216 lbs Today's date: 12/10/2022 Total lbs lost to date: 38 lbs Total lbs lost since last in-office visit: +2 lbs  Interim History: Wendy Crawford is here for a follow up office visit.  We reviewed her meal plan and all questions were answered.  Patient's food recall appears to be accurate and consistent with what is on plan when she is following it.   When eating on plan, her hunger and cravings are well controlled.   Patient had a few day off plan eating over the holidays, but overall doing great.  A lot of care given stress with caring for uncle in hospice and is still grieving the death of her mom from 10-09-2022.   Subjective:   1. Adjustment insomnia I started Trazodone at last office visit.  She is sleeping much better.  She lost her mom in 10/10/2023 and her uncle is sick.  Patient is caring for him now.  She has increased stress.     2. Vitamin D deficiency Wendy Crawford is tolerating medication(s) well without side effects.  Medication compliance is good as patient endorses taking it as prescribed.  Symptoms are stable and the patient denies additional concerns regarding this condition.  Last vitamin D level 48.0 about 4 months ago.  3. Mood disorder (Wendy Crawford)- emotional eating Patient is on Celexa daily managed by her PCP and also using Wellbutrin for emotional eating.  She is doing much better and tolerating Wellbutrin well.  Patient feels it is helping her.  Patient denies side effects.  Assessment/Plan:  No orders of the defined types were placed in this  encounter.   Medications Discontinued During This Encounter  Medication Reason   Vitamin D, Ergocalciferol, (DRISDOL) 1.25 MG (50000 UNIT) CAPS capsule Reorder     Meds ordered this encounter  Medications   Vitamin D, Ergocalciferol, (DRISDOL) 1.25 MG (50000 UNIT) CAPS capsule    Sig: Take 1 capsule (50,000 Units total) by mouth every 7 (seven) days.    Dispense:  5 capsule    Refill:  0    30 d supply;  ** OV for RF **   Do not send RF request     1. Adjustment insomnia Continue Trazodone.  Since she is doing great on 50 mg Trazodone nightly, patient have another 1 month supply.  No need for refills today.  Sleep hygiene strongly encouraged.  Stress management and self-care techniques discussed with patient.  2. Vitamin D deficiency Recheck labs in 2 office visit from today per patient preference as schedule.  Refill- Vitamin D, Ergocalciferol, (DRISDOL) 1.25 MG (50000 UNIT) CAPS capsule; Take 1 capsule (50,000 Units total) by mouth every 7 (seven) days.  Dispense: 5 capsule; Refill: 0  3. Mood disorder (Wendy Crawford)- emotional eating Continue Wellbutrin twice daily.  No dose change, no need for refills today.  Continue stress management techniques.  Increase exercise if possible.  4. Obesity, current BMI 42.3 Patient will refocus and get back to following Category 2, but journaling her intake.   Wendy Crawford is currently in the action stage of  change. As such, her goal is to continue with weight loss efforts. She has agreed to the Category 2 Plan.   Exercise goals:  increase activity as tolerated.   Behavioral modification strategies: emotional eating strategies, avoiding temptations, and planning for success.  Wendy Crawford has agreed to follow-up with our clinic in 4 weeks. She was informed of the importance of frequent follow-up visits to maximize her success with intensive lifestyle modifications for her multiple health conditions.   Objective:   Blood pressure 119/83, pulse 67, temperature  98.1 F (36.7 C), height 5' (1.524 m), weight 216 lb 9.6 oz (98.2 kg), SpO2 100 %. Body mass index is 42.3 kg/m.  General: Cooperative, alert, well developed, in no acute distress. HEENT: Conjunctivae and lids unremarkable. Cardiovascular: Regular rhythm.  Lungs: Normal work of breathing. Neurologic: No focal deficits.   Lab Results  Component Value Date   CREATININE 0.70 07/26/2022   BUN 15 07/26/2022   NA 141 07/26/2022   K 4.6 07/26/2022   CL 104 07/26/2022   CO2 22 07/26/2022   Lab Results  Component Value Date   ALT 13 07/26/2022   AST 12 07/26/2022   ALKPHOS 115 07/26/2022   BILITOT 0.3 07/26/2022   Lab Results  Component Value Date   HGBA1C 5.9 (H) 07/26/2022   HGBA1C 5.9 (H) 03/20/2022   Lab Results  Component Value Date   INSULIN 11.8 07/26/2022   INSULIN 13.9 03/20/2022   Lab Results  Component Value Date   TSH 4.140 07/26/2022   Lab Results  Component Value Date   CHOL 158 07/26/2022   HDL 60 07/26/2022   LDLCALC 71 07/26/2022   LDLDIRECT 143 (H) 03/15/2020   TRIG 159 (H) 07/26/2022   CHOLHDL 3.1 04/11/2021   Lab Results  Component Value Date   VD25OH 48.0 07/26/2022   VD25OH 28.0 (L) 03/20/2022   Lab Results  Component Value Date   WBC 5.2 03/20/2022   HGB 14.0 03/20/2022   HCT 42.2 03/20/2022   MCV 88 03/20/2022   PLT 221 03/20/2022   No results found for: "IRON", "TIBC", "FERRITIN"  Attestation Statements:   Reviewed by clinician on day of visit: allergies, medications, problem list, medical history, surgical history, family history, social history, and previous encounter notes.  I, Davy Pique, RMA, am acting as Location manager for [employer], DO.   I have reviewed the above documentation for accuracy and completeness, and I agree with the above. Marjory Sneddon, D.O.  The [place] was signed into law in 2016 which includes the topic of electronic health records.  This provides immediate access to  information in MyChart.  This includes consultation notes, operative notes, office notes, lab results and pathology reports.  If you have any questions about what you read please let us know at your next visit so we can discuss your concerns and take corrective action if need be.  We are right here with you.

## 2022-12-31 ENCOUNTER — Ambulatory Visit (INDEPENDENT_AMBULATORY_CARE_PROVIDER_SITE_OTHER): Payer: Medicare Other | Admitting: Family Medicine

## 2022-12-31 ENCOUNTER — Encounter (INDEPENDENT_AMBULATORY_CARE_PROVIDER_SITE_OTHER): Payer: Self-pay | Admitting: Family Medicine

## 2022-12-31 VITALS — BP 127/84 | HR 69 | Temp 97.9°F | Ht 60.0 in | Wt 219.4 lb

## 2022-12-31 DIAGNOSIS — G479 Sleep disorder, unspecified: Secondary | ICD-10-CM

## 2022-12-31 DIAGNOSIS — Z6841 Body Mass Index (BMI) 40.0 and over, adult: Secondary | ICD-10-CM

## 2022-12-31 DIAGNOSIS — M8588 Other specified disorders of bone density and structure, other site: Secondary | ICD-10-CM

## 2022-12-31 DIAGNOSIS — E559 Vitamin D deficiency, unspecified: Secondary | ICD-10-CM

## 2022-12-31 DIAGNOSIS — F5102 Adjustment insomnia: Secondary | ICD-10-CM

## 2022-12-31 MED ORDER — TRAZODONE HCL 100 MG PO TABS: 100.0000 mg | ORAL_TABLET | Freq: Every evening | ORAL | 0 refills | Status: DC | PRN

## 2023-01-01 DIAGNOSIS — G479 Sleep disorder, unspecified: Secondary | ICD-10-CM | POA: Insufficient documentation

## 2023-01-01 DIAGNOSIS — M8580 Other specified disorders of bone density and structure, unspecified site: Secondary | ICD-10-CM | POA: Insufficient documentation

## 2023-01-01 DIAGNOSIS — Z6841 Body Mass Index (BMI) 40.0 and over, adult: Secondary | ICD-10-CM | POA: Insufficient documentation

## 2023-01-21 ENCOUNTER — Encounter (INDEPENDENT_AMBULATORY_CARE_PROVIDER_SITE_OTHER): Payer: Self-pay | Admitting: Family Medicine

## 2023-01-21 ENCOUNTER — Ambulatory Visit (INDEPENDENT_AMBULATORY_CARE_PROVIDER_SITE_OTHER): Payer: Medicare Other | Admitting: Family Medicine

## 2023-01-21 VITALS — BP 113/77 | HR 71 | Temp 98.3°F | Ht 60.0 in | Wt 218.0 lb

## 2023-01-21 DIAGNOSIS — R7303 Prediabetes: Secondary | ICD-10-CM | POA: Diagnosis not present

## 2023-01-21 DIAGNOSIS — F17210 Nicotine dependence, cigarettes, uncomplicated: Secondary | ICD-10-CM | POA: Diagnosis not present

## 2023-01-21 DIAGNOSIS — R457 State of emotional shock and stress, unspecified: Secondary | ICD-10-CM | POA: Diagnosis not present

## 2023-01-21 DIAGNOSIS — Z6841 Body Mass Index (BMI) 40.0 and over, adult: Secondary | ICD-10-CM

## 2023-01-21 DIAGNOSIS — E559 Vitamin D deficiency, unspecified: Secondary | ICD-10-CM

## 2023-01-21 DIAGNOSIS — E669 Obesity, unspecified: Secondary | ICD-10-CM

## 2023-01-21 DIAGNOSIS — Z716 Tobacco abuse counseling: Secondary | ICD-10-CM | POA: Diagnosis not present

## 2023-01-21 DIAGNOSIS — F17200 Nicotine dependence, unspecified, uncomplicated: Secondary | ICD-10-CM

## 2023-01-21 DIAGNOSIS — E7849 Other hyperlipidemia: Secondary | ICD-10-CM

## 2023-01-21 MED ORDER — VITAMIN D (ERGOCALCIFEROL) 1.25 MG (50000 UNIT) PO CAPS: 50000.0000 [IU] | ORAL_CAPSULE | ORAL | 0 refills | Status: DC

## 2023-01-21 NOTE — Progress Notes (Unsigned)
Chief Complaint:   OBESITY Wendy Crawford is here to discuss her progress with her obesity treatment plan along with follow-up of her obesity related diagnoses. Wendy Crawford is on the Category 2 Plan and states she is following her eating plan approximately 25% of the time. Wendy Crawford states she is doing knee therapy exercises for 20-30 minutes 3 times per week.  Today's visit was #: 39 Starting weight: 254 lbs Starting date: 03/20/2022 Today's weight: 219 lbs Today's date: 12/31/2022 Total lbs lost to date: 35 Total lbs lost since last in-office visit: 0  Interim History: Wendy Crawford has had a lot of stress in her life lately. Uncle passed and sister hit by a car. Patient hasn't been focused on herself, and she states she does everything for everyone else and not for herself. She endorses exercise helps with stress and helps her get on track.   Subjective:   1. Osteopenia of other site-bone density Wendy Crawford has a new diagnosis. Bone density imaging recently done. Results of T-score of 1.3, osteopenic. Patient asked me what that means and what needs to be done. I discussed imaging results with the patient today.   2. Sleep difficulties Wendy Crawford is doing well on trazodone. She feels helps with her anxiety/excess worrying along with Celexa also.   Assessment/Plan:  No orders of the defined types were placed in this encounter.   Medications Discontinued During This Encounter  Medication Reason   traZODone (DESYREL) 100 MG tablet Reorder     Meds ordered this encounter  Medications   traZODone (DESYREL) 100 MG tablet    Sig: Take 1 tablet (100 mg total) by mouth at bedtime as needed for sleep.    Dispense:  30 tablet    Refill:  0     1. Osteopenia of other site-bone density Gave a list of Ca+ rich foods in addition to supplement. Band-it exercises given for resistance training 2 sets of 10 three days per week. Weight bearing exercises, and extensive counseling was done.    2. Sleep  difficulties We will refill trazodone for 1 month. Sleep hygiene was discussed. Increased exercises during the day will help with sleep at night.   - traZODone (DESYREL) 100 MG tablet; Take 1 tablet (100 mg total) by mouth at bedtime as needed for sleep.  Dispense: 30 tablet; Refill: 0  3. Morbid obesity (HCC)-Starting BMI 49.61  4. BMI 40.0-44.9, adult [facility]) Wendy Crawford is currently in the action stage of change. As such, her goal is to continue with weight loss efforts. She has agreed to change to the Category 2 Plan with breakfast and lunch options.   Exercise goals: As is, add walking for 10-15 minutes 2 days per week. Resistance bands 3 days per week (5 sets of 10).   Behavioral modification strategies: keeping healthy foods in the home.  Wendy Crawford has agreed to follow-up with our clinic in 3 weeks. She was informed of the importance of frequent follow-up visits to maximize her success with intensive lifestyle modifications for her multiple health conditions.   Objective:   Blood pressure 127/84, pulse 69, temperature 97.9 F (36.6 C), height 5' (1.524 m), weight 219 lb 6.4 oz (99.5 kg), SpO2 95 %. Body mass index is 42.85 kg/m.  General: Cooperative, alert, well developed, in no acute distress. HEENT: Conjunctivae and lids unremarkable. Cardiovascular: Regular rhythm.  Lungs: Normal work of breathing. Neurologic: No focal deficits.   Lab Results  Component Value Date   CREATININE 0.70 07/26/2022   BUN 15 07/26/2022  NA 141 07/26/2022   K 4.6 07/26/2022   CL 104 07/26/2022   CO2 22 07/26/2022   Lab Results  Component Value Date   ALT 13 07/26/2022   AST 12 07/26/2022   ALKPHOS 115 07/26/2022   BILITOT 0.3 07/26/2022   Lab Results  Component Value Date   HGBA1C 5.9 (H) 07/26/2022   HGBA1C 5.9 (H) 03/20/2022   Lab Results  Component Value Date   INSULIN 11.8 07/26/2022   INSULIN 13.9 03/20/2022   Lab Results  Component Value Date   TSH 4.140 07/26/2022   Lab  Results  Component Value Date   CHOL 158 07/26/2022   HDL 60 07/26/2022   LDLCALC 71 07/26/2022   LDLDIRECT 143 (H) 03/15/2020   TRIG 159 (H) 07/26/2022   CHOLHDL 3.1 04/11/2021   Lab Results  Component Value Date   VD25OH 48.0 07/26/2022   VD25OH 28.0 (L) 03/20/2022   Lab Results  Component Value Date   WBC 5.2 03/20/2022   HGB 14.0 03/20/2022   HCT 42.2 03/20/2022   MCV 88 03/20/2022   PLT 221 03/20/2022   No results found for: "IRON", "TIBC", "FERRITIN"  Attestation Statements:   Reviewed by clinician on day of visit: allergies, medications, problem list, medical history, surgical history, family history, social history, and previous encounter notes.   Wilhemena Durie, am acting as transcriptionist for [employer], DO.   I have reviewed the above documentation for accuracy and completeness, and I agree with the above. Marjory Sneddon, D.O.  The [place] was signed into law in 2016 which includes the topic of electronic health records.  This provides immediate access to information in MyChart.  This includes consultation notes, operative notes, office notes, lab results and pathology reports.  If you have any questions about what you read please let us know at your next visit so we can discuss your concerns and take corrective action if need be.  We are right here with you.

## 2023-01-22 LAB — CBC WITH DIFFERENTIAL/PLATELET
Basophils Absolute: 0.1 10*3/uL (ref 0.0–0.2)
Basos: 1 %
EOS (ABSOLUTE): 0.1 10*3/uL (ref 0.0–0.4)
Eos: 1 %
Hematocrit: 42.6 % (ref 34.0–46.6)
Hemoglobin: 14.1 g/dL (ref 11.1–15.9)
Immature Grans (Abs): 0 10*3/uL (ref 0.0–0.1)
Immature Granulocytes: 0 %
Lymphocytes Absolute: 1.4 10*3/uL (ref 0.7–3.1)
Lymphs: 30 %
MCH: 29.2 pg (ref 26.6–33.0)
MCHC: 33.1 g/dL (ref 31.5–35.7)
MCV: 88 fL (ref 79–97)
Monocytes Absolute: 0.4 10*3/uL (ref 0.1–0.9)
Monocytes: 8 %
Neutrophils Absolute: 2.7 10*3/uL (ref 1.4–7.0)
Neutrophils: 60 %
Platelets: 228 10*3/uL (ref 150–450)
RBC: 4.83 x10E6/uL (ref 3.77–5.28)
RDW: 13.5 % (ref 11.7–15.4)
WBC: 4.6 10*3/uL (ref 3.4–10.8)

## 2023-01-22 LAB — COMPREHENSIVE METABOLIC PANEL
ALT: 8 IU/L (ref 0–32)
AST: 14 IU/L (ref 0–40)
Albumin/Globulin Ratio: 2.1 (ref 1.2–2.2)
Albumin: 4.8 g/dL (ref 3.9–4.9)
Alkaline Phosphatase: 132 IU/L — ABNORMAL HIGH (ref 44–121)
BUN/Creatinine Ratio: 17 (ref 12–28)
BUN: 14 mg/dL (ref 8–27)
Bilirubin Total: 0.3 mg/dL (ref 0.0–1.2)
CO2: 22 mmol/L (ref 20–29)
Calcium: 9.9 mg/dL (ref 8.7–10.3)
Chloride: 101 mmol/L (ref 96–106)
Creatinine, Ser: 0.83 mg/dL (ref 0.57–1.00)
Globulin, Total: 2.3 g/dL (ref 1.5–4.5)
Glucose: 88 mg/dL (ref 70–99)
Potassium: 4.9 mmol/L (ref 3.5–5.2)
Sodium: 141 mmol/L (ref 134–144)
Total Protein: 7.1 g/dL (ref 6.0–8.5)
eGFR: 78 mL/min/{1.73_m2} (ref >59)

## 2023-01-22 LAB — LIPID PANEL
Chol/HDL Ratio: 2.8 ratio (ref 0.0–4.4)
Cholesterol, Total: 187 mg/dL (ref 100–199)
HDL: 68 mg/dL (ref >39)
LDL Chol Calc (NIH): 97 mg/dL (ref 0–99)
Triglycerides: 126 mg/dL (ref 0–149)
VLDL Cholesterol Cal: 22 mg/dL (ref 5–40)

## 2023-01-22 LAB — TSH: TSH: 4.15 u[IU]/mL (ref 0.450–4.500)

## 2023-01-22 LAB — INSULIN, RANDOM: INSULIN: 11 u[IU]/mL (ref 2.6–24.9)

## 2023-01-22 LAB — HEMOGLOBIN A1C
Est. average glucose Bld gHb Est-mCnc: 120 mg/dL
Hgb A1c MFr Bld: 5.8 % — ABNORMAL HIGH (ref 4.8–5.6)

## 2023-01-22 LAB — VITAMIN D 25 HYDROXY (VIT D DEFICIENCY, FRACTURES): Vit D, 25-Hydroxy: 50.7 ng/mL (ref 30.0–100.0)

## 2023-01-25 ENCOUNTER — Other Ambulatory Visit (INDEPENDENT_AMBULATORY_CARE_PROVIDER_SITE_OTHER): Payer: Self-pay | Admitting: Family Medicine

## 2023-01-25 DIAGNOSIS — G479 Sleep disorder, unspecified: Secondary | ICD-10-CM

## 2023-01-31 NOTE — Progress Notes (Signed)
Chief Complaint:   OBESITY Wendy Crawford is here to discuss her progress with her obesity treatment plan along with follow-up of her obesity related diagnoses. Pavithra is on the Category 2 Plan with breakfast and lunch options and states she is following her eating plan approximately 25% of the time. Erykah states she is doing band exercise 15 minutes 3-4 times per week.  Today's visit was #: 79 Starting weight: 254 LBS Starting date: 03/20/2022 Today's weight: 218 LBS Today's date: 01/21/2023 Total lbs lost to date: 36 Total lbs lost since last in-office visit: 1 LB  Interim History: Patient has stress of dealing with her mom and close he states, it is a little more manageable now.  Subjective:   1. Vitamin D deficiency Wendy Crawford is tolerating medication(s) well without side effects.  Medication compliance is good as patient endorses taking it as prescribed.  Symptoms are stable and the patient denies additional concerns regarding this condition.  Patient is taking vitamin D every 7 days.  2. Prediabetes Patient has a cookie craving for usually is because she skips foods, meals and is hungry and emotional making poorer decisions.    3. Other hyperlipidemia Patient is tolerating Lipitor 20 mg well, compliance is good.  4. Smoking Patient smokes half a pack per day currently, has been smoking for 45 to 50 years 1 pack/day..  5. Caregiver stress syndrome - emotional eating Patient is doing better with remembering the second dose.  Mood is improved and stressors are more tolerable.  Assessment/Plan:   Orders Placed This Encounter  Procedures   CBC with Differential/Platelet   Comprehensive metabolic panel   Hemoglobin A1c   Insulin, random   Lipid panel   TSH   VITAMIN D 25 Hydroxy (Vit-D Deficiency, Fractures)    Medications Discontinued During This Encounter  Medication Reason   Vitamin D, Ergocalciferol, (DRISDOL) 1.25 MG (50000 UNIT) CAPS capsule Reorder      Meds ordered this encounter  Medications   Vitamin D, Ergocalciferol, (DRISDOL) 1.25 MG (50000 UNIT) CAPS capsule    Sig: Take 1 capsule (50,000 Units total) by mouth every 7 (seven) days.    Dispense:  5 capsule    Refill:  0    30 d supply;  ** OV for RF **   Do not send RF request     1. Vitamin D deficiency - I discussed the importance of vitamin D to the patient's health and well-being.  - I reviewed possible symptoms of low Vitamin D:  low energy, depressed mood, muscle aches, joint aches, osteoporosis etc. was reviewed with patient - low Vitamin D levels may be linked to an increased risk of cardiovascular events and even increased risk of cancers- such as colon and breast.  - ideal vitamin D levels reviewed with patient  - I recommend pt take a 50,000 IU weekly prescription vit D - see script below   - Informed patient this may be a lifelong thing, and she was encouraged to continue to take the medicine until told otherwise.    - weight loss will likely improve availability of vitamin D, thus encouraged Wendy Crawford to continue with meal plan and their weight loss efforts to further improve this condition.  Thus, we will need to monitor levels regularly (every 3-4 mo on average) to keep levels within normal limits and prevent over supplementation. - pt's questions and concerns regarding this condition addressed.   Refill- Vitamin D, Ergocalciferol, (DRISDOL) 1.25 MG (50000 UNIT) CAPS  capsule; Take 1 capsule (50,000 Units total) by mouth every 7 (seven) days.  Dispense: 5 capsule; Refill: 0  - VITAMIN D 25 Hydroxy (Vit-D Deficiency, Fractures)  2. Prediabetes Patient prefers to hold off of medications and try not to skip as much and eat on plan more.  - Hemoglobin A1c - Insulin, random  3. Other hyperlipidemia Estevan Ryder Wendy Crawford agrees to continue with meds and/or our treatment plan of a heart-heathy, low cholesterol meal plan - We recommend: aerobic activity with eventual goal of a  minimum of 150+ min wk plus 2 days/ week of resistance or strength training.   - Cardiovascular risk and specific lipid/LDL goals reviewed. - We extensively discussed several lifestyle modifications today and Wendy Crawford will continue to work on diet, exercise and weight loss efforts.  - I stressed the importance that patient continue with our prudent nutritional plan that is low in saturated and trans fats, and low in fatty carbs to improve these numbers.   - We will continue routine screening as patient continues to achieve health goals along their weight loss Crawford Last lipid panel as below.   Check labs today.  - Comprehensive metabolic panel - Lipid panel  4. Smoking 5 to 8 minutes of smoking cessation counseling given to patient.  Patient is not ready to quit and she will try to wean months smoked per day.  5. Caregiver stress syndrome - emotional eating Continue Wellbutrin 150 mg twice daily, check labs today.  - CBC with Differential/Platelet - TSH  6. BMI 40.0-44.9, adult (HCC)-current bmi 42.7  7. Obesity with Starting BMI of 49.6-start bmi 49.61 Wendy Crawford is currently in the action stage of change. As such, her goal is to continue with weight loss efforts. She has agreed to the Category 2 Plan with breakfast and lunch options..   Exercise goals: For substantial health benefits, adults should do at least 150 minutes (2 hours and 30 minutes) a week of moderate-intensity, or 75 minutes (1 hour and 15 minutes) a week of vigorous-intensity aerobic physical activity, or an equivalent combination of moderate- and vigorous-intensity aerobic activity. Aerobic activity should be performed in episodes of at least 10 minutes, and preferably, it should be spread throughout the week.  Behavioral modification strategies: avoiding temptations and planning for success.  Wendy Crawford has agreed to follow-up with our clinic in 3 weeks. She was informed of the importance of frequent follow-up visits to  maximize her success with intensive lifestyle modifications for her multiple health conditions.   Wendy Crawford was informed we would discuss her lab results at her next visit unless there is a critical issue that needs to be addressed sooner. Amera agreed to keep her next visit at the agreed upon time to discuss these results.  Objective:   Blood pressure 113/77, pulse 71, temperature 98.3 F (36.8 C), height 5' (1.524 m), weight 218 lb (98.9 kg), SpO2 97 %. Body mass index is 42.58 kg/m.  General: Cooperative, alert, well developed, in no acute distress. HEENT: Conjunctivae and lids unremarkable. Cardiovascular: Regular rhythm.  Lungs: Normal work of breathing. Neurologic: No focal deficits.   Lab Results  Component Value Date   CREATININE 0.83 01/21/2023   BUN 14 01/21/2023   NA 141 01/21/2023   K 4.9 01/21/2023   CL 101 01/21/2023   CO2 22 01/21/2023   Lab Results  Component Value Date   ALT 8 01/21/2023   AST 14 01/21/2023   ALKPHOS 132 (H) 01/21/2023   BILITOT 0.3 01/21/2023   Lab  Results  Component Value Date   HGBA1C 5.8 (H) 01/21/2023   HGBA1C 5.9 (H) 07/26/2022   HGBA1C 5.9 (H) 03/20/2022   Lab Results  Component Value Date   INSULIN 11.0 01/21/2023   INSULIN 11.8 07/26/2022   INSULIN 13.9 03/20/2022   Lab Results  Component Value Date   TSH 4.150 01/21/2023   Lab Results  Component Value Date   CHOL 187 01/21/2023   HDL 68 01/21/2023   LDLCALC 97 01/21/2023   LDLDIRECT 143 (H) 03/15/2020   TRIG 126 01/21/2023   CHOLHDL 2.8 01/21/2023   Lab Results  Component Value Date   VD25OH 50.7 01/21/2023   VD25OH 48.0 07/26/2022   VD25OH 28.0 (L) 03/20/2022   Lab Results  Component Value Date   WBC 4.6 01/21/2023   HGB 14.1 01/21/2023   HCT 42.6 01/21/2023   MCV 88 01/21/2023   PLT 228 01/21/2023   No results found for: "IRON", "TIBC", "FERRITIN"  Attestation Statements:   Reviewed by clinician on day of visit: allergies, medications, problem  list, medical history, surgical history, family history, social history, and previous encounter notes.  I, Davy Pique, RMA, am acting as Location manager for [employer], DO.   I have reviewed the above documentation for accuracy and completeness, and I agree with the above. Marjory Sneddon, D.O.  The [place] was signed into law in 2016 which includes the topic of electronic health records.  This provides immediate access to information in MyChart.  This includes consultation notes, operative notes, office notes, lab results and pathology reports.  If you have any questions about what you read please let us know at your next visit so we can discuss your concerns and take corrective action if need be.  We are right here with you.

## 2023-02-07 ENCOUNTER — Encounter: Payer: Self-pay | Admitting: Radiology

## 2023-02-11 ENCOUNTER — Ambulatory Visit (INDEPENDENT_AMBULATORY_CARE_PROVIDER_SITE_OTHER): Payer: Medicare Other | Admitting: Family Medicine

## 2023-02-11 ENCOUNTER — Encounter (INDEPENDENT_AMBULATORY_CARE_PROVIDER_SITE_OTHER): Payer: Self-pay | Admitting: Family Medicine

## 2023-02-11 VITALS — BP 145/82 | HR 70 | Temp 97.6°F | Ht 60.0 in | Wt 220.0 lb

## 2023-02-11 DIAGNOSIS — F39 Unspecified mood [affective] disorder: Secondary | ICD-10-CM

## 2023-02-11 DIAGNOSIS — Z6841 Body Mass Index (BMI) 40.0 and over, adult: Secondary | ICD-10-CM

## 2023-02-11 DIAGNOSIS — R7303 Prediabetes: Secondary | ICD-10-CM

## 2023-02-11 DIAGNOSIS — E559 Vitamin D deficiency, unspecified: Secondary | ICD-10-CM | POA: Diagnosis not present

## 2023-02-11 DIAGNOSIS — E7849 Other hyperlipidemia: Secondary | ICD-10-CM | POA: Diagnosis not present

## 2023-02-11 DIAGNOSIS — E038 Other specified hypothyroidism: Secondary | ICD-10-CM | POA: Diagnosis not present

## 2023-02-11 DIAGNOSIS — E669 Obesity, unspecified: Secondary | ICD-10-CM

## 2023-02-11 MED ORDER — VITAMIN D (ERGOCALCIFEROL) 1.25 MG (50000 UNIT) PO CAPS: 50000.0000 [IU] | ORAL_CAPSULE | ORAL | 0 refills | Status: DC

## 2023-02-11 NOTE — Progress Notes (Signed)
Wendy Crawford, D.O.  ABFM, ABOM Specializing in Clinical Bariatric Medicine  Office located at: [address], [place]  [id number]     Assessment and Plan:   No orders of the defined types were placed in this encounter.   Medications Discontinued During This Encounter  Medication Reason   Vitamin D, Ergocalciferol, (DRISDOL) 1.25 MG (50000 UNIT) CAPS capsule Reorder     Meds ordered this encounter  Medications   Vitamin D, Ergocalciferol, (DRISDOL) 1.25 MG (50000 UNIT) CAPS capsule    Sig: Take 1 capsule (50,000 Units total) by mouth every 7 (seven) days.    Dispense:  5 capsule    Refill:  0    30 d supply;  ** OV for RF **   Do not send RF request     Vitamin D deficiency Assessment: Condition is At goal.. Labs were reviewed. She has been compliant with Ergocalciferol 50K IU weekly. Lab Results  Component Value Date   VD25OH 50.7 01/21/2023   Plan:Continue Ergocalciferol 50K IU weekly- refill provided today. - I discussed the importance of vitamin D to the patient's health and well-being.  - I reviewed possible symptoms of low Vitamin D:  low energy, depressed mood, muscle aches, joint aches, osteoporosis etc. was reviewed with patient - low Vitamin D levels may be linked to an increased risk of cardiovascular events and even increased risk of cancers- such as colon and breast.  - ideal vitamin D levels reviewed with patient  - Informed patient this may be a lifelong thing, and she was encouraged to continue to take the medicine until told otherwise.    - weight loss will likely improve availability of vitamin D, thus encouraged Wendy Crawford to continue with meal plan and their weight loss efforts to further improve this condition.  Thus, we will need to monitor levels regularly (every 3-4 mo on average) to keep levels within normal limits and prevent over supplementation. - pt's questions and concerns regarding this condition addressed.    BMI 40.0-44.9,  adult (HCC)-current bmi 43.0 Obesity with Starting BMI of 49.61/dte 03/20/22 Assessment: Condition is Not at goal.. Labs were reviewed. Extensive discussion was had with patient regarding how the foods that they eat affect their labs. Biometric data collected today, was reviewed with patient.  Fat mass has increased by 5lbs, muscle mass has decreased by 3.4lbs, and total body water has increased by 1.6lbs.  Plan: Continue the Category 2 Plan with breakfast and lunch options.  Encouraged her to begin a walking routine to help with weight loss and stress relief. Advised against skipping meals.   Other hyperlipidemia Assessment: Condition is Not at goal.. Labs were reviewed. She has been compliant with Lipitor '20mg'$  daily. Her lipids have improved overall apart from LDL which is still under 100. Lab Results  Component Value Date   CHOL 187 01/21/2023   HDL 68 01/21/2023   LDLCALC 97 01/21/2023   LDLDIRECT 143 (H) 03/15/2020   TRIG 126 01/21/2023   CHOLHDL 2.8 01/21/2023   Plan:Wendy Crawford agrees to continue with meds and/or our treatment plan of a heart-heathy, low cholesterol meal plan - We recommend: aerobic activity with eventual goal of a minimum of 150+ min wk plus 2 days/ week of resistance or strength training.   - Cardiovascular risk and specific lipid/LDL goals reviewed. - We extensively discussed several lifestyle modifications today and Wendy Crawford will continue to work on diet, exercise and weight loss efforts.  - I stressed the  importance that patient continue with our prudent nutritional plan that is low in saturated and trans fats, and low in fatty carbs to improve these numbers.   - We will continue routine screening as patient continues to achieve health goals along their weight loss Crawford Last lipid panel as above.    Prediabetes Assessment: Condition is Improving, but not optimized.. Labs were reviewed. Extensive discussion was had with patient regarding how the  foods that they eat affect their labs. She is not on any medications for blood sugar management. We discussed the improvement of her A1c and insulin. Lab Results  Component Value Date   HGBA1C 5.8 (H) 01/21/2023   HGBA1C 5.9 (H) 07/26/2022   HGBA1C 5.9 (H) 03/20/2022   INSULIN 11.0 01/21/2023   INSULIN 11.8 07/26/2022   INSULIN 13.9 03/20/2022   Plan: This is a new diagnosis for [name] - I counseled patient on pathophysiology of the disease process of Pre-DM.   - Stressed importance of dietary and lifestyle modifications to result in weight loss as first line txmnt - In addition, we discussed the risks and benefits of various medication options which can help [place] in the management of this disease process as well as with weight loss.  Will consider starting one of these meds in future as we will focus on prudent nutritional plan at this time.  - Continue to decrease simple carbs/ sugars; increase fiber and proteins -> follow her meal plan.   - Explained role of simple carbs and insulin levels on hunger and cravings - Handouts provided at pt's request after education provided.  All concerns/questions addressed.   - Anticipatory guidance given.   Wendy Crawford will continue to work on weight loss, exercise, via their meal plan we devised to help decrease the risk of progressing to diabetes.  - We will recheck A1c and fasting insulin level in approximately 3 months from last check, or as deemed appropriate.    Mood disorder - emotional eating Assessment: Condition is Uncontrolled. . She is struggling with emotional eating and stress. She is taking Celexa as managed by her PCP. She is taking Wellbutrin SR '150mg'$  BID as well.  Plan:I recommended Dr. Mallie Mussel to talk about her emotional eating struggles. Pt would like to think about it and may make an appointment with her. She was also provided with emotional eating and counseling resources handouts Continue medications as prescribed. Encouraged  her to do 1 activity per day that makes her happy.   Other specified hypothyroidism Assessment: Condition is At goal.. Labs were reviewed. She is not on any thyroid medications. Lab Results  Component Value Date   TSH 4.150 01/21/2023   T3TOTAL 120 07/26/2022   T4TOTAL 9.2 03/20/2022   Plan: Will continue to monitor thyroid labs- recheck 3 months from last labs.   CBC WNL. BMP WNL and improvement of liver labs.     TREATMENT PLAN FOR OBESITY:  Recommended Dietary Goals Wendy Crawford is currently in the action stage of change. As such, her goal is to continue weight management plan. She has agreed to continue with the Category 2 Plan with breakfast and lunch options.  Behavioral Intervention We discussed the following Behavioral Modification Strategies today: increasing lean protein intake, decreasing simple carbohydrates , increasing lower glycemic fruits, work on meal planning and easy cooking plans, decreasing eating out, consumption of processed foods, and making healthy choices when eating convenient foods, emotional eating strategies and understanding the difference between hunger signals and cravings, and work on  managing stress, creating time for self-care and relaxation measures. Additional resources provided today: mindful eating habits and emotional eating and health resources handouts Evidence-based interventions for health behavior change were utilized today including the discussion of self monitoring techniques, problem-solving barriers and SMART goal setting techniques.   Regarding patient's less desirable eating habits and patterns, we employed the technique of small changes.  Pt will specifically work on: do at least 1 activity every day that makes her happy such as walking for next visit.    Recommended Physical Activity Goals Wendy Crawford has been advised to work up to 150 minutes of moderate intensity aerobic activity a week and strengthening exercises 2-3 times per week for  cardiovascular health, weight loss maintenance and preservation of muscle mass.  She has agreed to increase physical activity in their day and reduce sedentary time (increase NEAT).     FOLLOW UP: No follow-ups on file.Marland Kitchen She was informed of the importance of frequent follow up visits to maximize her success with intensive lifestyle modifications for her multiple health conditions.  Weight Summary and Biometrics   Weight Lost Since Last Visit: +2lb  No data recorded  Vitals Temp: 97.6 F (36.4 C) BP: (!) 145/82 Pulse Rate: 70 SpO2: 97 %   Anthropometric Measurements Height: 5' (1.524 m) Weight: 220 lb (99.8 kg) BMI (Calculated): 42.97 Weight at Last Visit: 218lb Weight Lost Since Last Visit: +2lb Starting Weight: 254lb Total Weight Loss (lbs): 34 lb (15.4 kg) Peak Weight: 2602lb   Body Composition  Body Fat %: 52.6 % Fat Mass (lbs): 115.8 lbs Muscle Mass (lbs): 99 lbs Total Body Water (lbs): 79.4 lbs Visceral Fat Rating : 18   Other Clinical Data Fasting: no Labs: no Today's Visit #: 15 Starting Date: 03/20/22    Subjective:   Chief complaint: Obesity Wendy Crawford is here to discuss her progress with her obesity treatment plan. She is on the Category 2 Plan with breakfast and lunch options and states she is following her eating plan approximately 25 % of the time. She states she is not exercising.  Interval History:  Since last office visit she states that she has had difficulty following her meal plan primarily due to emotional eating and stress. She states that the lack of convenience of meal planning/prepping is her biggest challenge. She enjoys reading as this helps her mood. Otherwise she does not have any particular activities that relieve her stress.  Review of Systems:  Pertinent positives were addressed with patient today.  Objective:   PHYSICAL EXAM:  Blood pressure (!) 145/82, pulse 70, temperature 97.6 F (36.4 C), height 5' (1.524 m), weight 220  lb (99.8 kg), SpO2 97 %. Body mass index is 42.97 kg/m.  General: Well Developed, well nourished, and in no acute distress.  HEENT: Normocephalic, atraumatic Skin: Warm and dry, cap RF less 2 sec, good turgor Chest:  Normal excursion, shape, no gross abn Respiratory: speaking in full sentences, no conversational dyspnea NeuroM-Sk: Ambulates w/o assistance, moves * 4 Psych: A and O *3, insight good, mood-full   DIAGNOSTIC DATA REVIEWED:  BMET    Component Value Date/Time   NA 141 01/21/2023 1008   K 4.9 01/21/2023 1008   CL 101 01/21/2023 1008   CO2 22 01/21/2023 1008   GLUCOSE 88 01/21/2023 1008   GLUCOSE 85 04/11/2021 0838   BUN 14 01/21/2023 1008   CREATININE 0.83 01/21/2023 1008   CREATININE 0.87 04/11/2021 0838   CALCIUM 9.9 01/21/2023 1008   GFRNONAA 70 04/11/2021 [id number]  GFRAA 82 04/11/2021 0838   Lab Results  Component Value Date   HGBA1C 5.8 (H) 01/21/2023   HGBA1C 5.9 (H) 03/20/2022   Lab Results  Component Value Date   INSULIN 11.0 01/21/2023   INSULIN 13.9 03/20/2022   Lab Results  Component Value Date   TSH 4.150 01/21/2023   CBC    Component Value Date/Time   WBC 4.6 01/21/2023 1008   WBC 4.2 04/11/2021 0838   RBC 4.83 01/21/2023 1008   RBC 4.61 04/11/2021 0838   HGB 14.1 01/21/2023 1008   HCT 42.6 01/21/2023 1008   PLT 228 01/21/2023 1008   MCV 88 01/21/2023 1008   MCH 29.2 01/21/2023 1008   MCH 29.3 04/11/2021 0838   MCHC 33.1 01/21/2023 1008   MCHC 32.5 04/11/2021 0838   RDW 13.5 01/21/2023 1008   Iron Studies No results found for: "IRON", "TIBC", "FERRITIN", "IRONPCTSAT" Lipid Panel     Component Value Date/Time   CHOL 187 01/21/2023 1008   TRIG 126 01/21/2023 1008   HDL 68 01/21/2023 1008   CHOLHDL 2.8 01/21/2023 1008   CHOLHDL 3.1 04/11/2021 0838   LDLCALC 97 01/21/2023 1008   LDLCALC 106 (H) 04/11/2021 0838   LDLDIRECT 143 (H) 03/15/2020 1643   Hepatic Function Panel     Component Value Date/Time   PROT 7.1 01/21/2023  1008   ALBUMIN 4.8 01/21/2023 1008   AST 14 01/21/2023 1008   ALT 8 01/21/2023 1008   ALKPHOS 132 (H) 01/21/2023 1008   BILITOT 0.3 01/21/2023 1008   BILIDIR 0.1 06/05/2007 1051      Component Value Date/Time   TSH 4.150 01/21/2023 1008   Nutritional Lab Results  Component Value Date   VD25OH 50.7 01/21/2023   VD25OH 48.0 07/26/2022   VD25OH 28.0 (L) 03/20/2022    Attestations:   Reviewed by clinician on day of visit: allergies, medications, problem list, medical history, surgical history, family history, social history, and previous encounter notes.   Patient was in the office today and time spent on visit including pre-visit chart review and post-visit care/coordination of care and electronic medical record documentation was *** minutes. 50% of the time was in face to face counseling of this patient's medical condition(s) and providing education on treatment options to include the first-line treatment of diet and lifestyle modification.   I,Alexis Herring,acting as a Education administrator for [employer], DO.,have documented all relevant documentation on the behalf of Mellody Dance, DO,as directed by  Mellody Dance, DO while in the presence of Mellody Dance, DO.   I, Mellody Dance, DO, have reviewed all documentation for this visit. The documentation on 02/11/23 for the exam, diagnosis, procedures, and orders are all accurate and complete.

## 2023-02-14 ENCOUNTER — Other Ambulatory Visit: Payer: Self-pay | Admitting: Family Medicine

## 2023-02-14 DIAGNOSIS — F39 Unspecified mood [affective] disorder: Secondary | ICD-10-CM

## 2023-02-14 MED ORDER — BUPROPION HCL ER (SR) 150 MG PO TB12: ORAL_TABLET | ORAL | 1 refills | Status: DC

## 2023-02-14 NOTE — Telephone Encounter (Signed)
Prescription Request  02/14/2023  LOV: 06/22/2022  What is the name of the medication or equipment? buPROPion (WELLBUTRIN SR) 150 MG 12 hr tablet   Have you contacted your pharmacy to request a refill? Yes   Which pharmacy would you like this sent to?  [id number] [id number]: [phone]: [phone]   Patient notified that their request is being sent to the clinical staff for review and that they should receive a response within 2 business days.   [phone]

## 2023-02-14 NOTE — Telephone Encounter (Signed)
Requested medication (s) are due for refill today - no  Requested medication (s) are on the active medication list -yes  Future visit scheduled -no  Last refill: 11/07/22 #180 1RF  Notes to clinic: Refill for 6 months with notes- not sure received at pharmacy- marked no print  Requested Prescriptions  Pending Prescriptions Disp Refills   buPROPion (WELLBUTRIN SR) 150 MG 12 hr tablet 180 tablet 1    Sig: 1 po q am, and 1 po q mid-afternoon     Psychiatry: Antidepressants - bupropion Failed - 02/14/2023 10:22 AM      Failed - Last BP in normal range    BP Readings from Last 1 Encounters:  02/11/23 (!) 145/82         Failed - Valid encounter within last 6 months    Recent Outpatient Visits           1 year ago [place] Eulogio Bear, NP   1 year ago Pure hypercholesterolemia   [place] Dennard Schaumann, Cammie Mcgee, MD   2 years ago Herpes zoster without complication   [place], [place], FNP   2 years ago Herpes zoster without complication   [place], [place], FNP   2 years ago Screening cholesterol level   [place] Pickard, Cammie Mcgee, MD              Passed - Cr in normal range and within 360 days    Creat  Date Value Ref Range Status  04/11/2021 0.87 0.50 - 0.99 mg/dL Final    Comment:    For patients >23 years of age, the reference limit for Creatinine is approximately 13% higher for people identified as African-American. .    Creatinine, Ser  Date Value Ref Range Status  01/21/2023 0.83 0.57 - 1.00 mg/dL Final         Passed - AST in normal range and within 360 days    AST  Date Value Ref Range Status  01/21/2023 14 0 - 40 IU/L Final         Passed - ALT in normal range and within 360 days    ALT  Date Value Ref Range Status  01/21/2023 8 0 - 32 IU/L Final            Requested Prescriptions  Pending Prescriptions Disp Refills    buPROPion (WELLBUTRIN SR) 150 MG 12 hr tablet 180 tablet 1    Sig: 1 po q am, and 1 po q mid-afternoon     Psychiatry: Antidepressants - bupropion Failed - 02/14/2023 10:22 AM      Failed - Last BP in normal range    BP Readings from Last 1 Encounters:  02/11/23 (!) 145/82         Failed - Valid encounter within last 6 months    Recent Outpatient Visits           1 year ago [place] Eulogio Bear, NP   1 year ago Pure hypercholesterolemia   [place] [name], MD   2 years ago Herpes zoster without complication   [place], [place], FNP   2 years ago Herpes zoster without complication   [place], [place], FNP   2 years ago Screening cholesterol level   [place],  Cammie Mcgee, MD              Passed - Cr in normal range and within 360 days    Creat  Date Value Ref Range Status  04/11/2021 0.87 0.50 - 0.99 mg/dL Final    Comment:    For patients >70 years of age, the reference limit for Creatinine is approximately 13% higher for people identified as African-American. .    Creatinine, Ser  Date Value Ref Range Status  01/21/2023 0.83 0.57 - 1.00 mg/dL Final         Passed - AST in normal range and within 360 days    AST  Date Value Ref Range Status  01/21/2023 14 0 - 40 IU/L Final         Passed - ALT in normal range and within 360 days    ALT  Date Value Ref Range Status  01/21/2023 8 0 - 32 IU/L Final

## 2023-02-20 ENCOUNTER — Emergency Department (HOSPITAL_COMMUNITY): Payer: Medicare Other

## 2023-02-20 ENCOUNTER — Encounter (HOSPITAL_COMMUNITY): Payer: Self-pay | Admitting: Emergency Medicine

## 2023-02-20 ENCOUNTER — Ambulatory Visit (INDEPENDENT_AMBULATORY_CARE_PROVIDER_SITE_OTHER): Payer: Medicare Other | Admitting: Family Medicine

## 2023-02-20 ENCOUNTER — Encounter: Payer: Self-pay | Admitting: Family Medicine

## 2023-02-20 ENCOUNTER — Encounter (HOSPITAL_COMMUNITY): Admission: EM | Disposition: E | Payer: Self-pay | Source: Ambulatory Visit | Attending: Pulmonary Disease

## 2023-02-20 ENCOUNTER — Inpatient Hospital Stay (HOSPITAL_COMMUNITY)
Admission: EM | Admit: 2023-02-20 | Discharge: 2023-03-04 | DRG: 853 | Disposition: E | Payer: Medicare Other | Source: Ambulatory Visit | Attending: Pulmonary Disease | Admitting: Pulmonary Disease

## 2023-02-20 VITALS — BP 84/42 | HR 115 | Temp 100.0°F | Ht 61.0 in | Wt 224.0 lb

## 2023-02-20 DIAGNOSIS — J440 Chronic obstructive pulmonary disease with acute lower respiratory infection: Secondary | ICD-10-CM | POA: Diagnosis present

## 2023-02-20 DIAGNOSIS — J810 Acute pulmonary edema: Secondary | ICD-10-CM | POA: Diagnosis not present

## 2023-02-20 DIAGNOSIS — N493 Fournier gangrene: Secondary | ICD-10-CM

## 2023-02-20 DIAGNOSIS — N7682 Fournier disease of vagina and vulva: Secondary | ICD-10-CM | POA: Diagnosis present

## 2023-02-20 DIAGNOSIS — A419 Sepsis, unspecified organism: Secondary | ICD-10-CM | POA: Diagnosis not present

## 2023-02-20 DIAGNOSIS — J9601 Acute respiratory failure with hypoxia: Secondary | ICD-10-CM | POA: Diagnosis present

## 2023-02-20 DIAGNOSIS — B9620 Unspecified Escherichia coli [E. coli] as the cause of diseases classified elsewhere: Secondary | ICD-10-CM | POA: Insufficient documentation

## 2023-02-20 DIAGNOSIS — E869 Volume depletion, unspecified: Secondary | ICD-10-CM | POA: Diagnosis present

## 2023-02-20 DIAGNOSIS — F419 Anxiety disorder, unspecified: Secondary | ICD-10-CM | POA: Diagnosis present

## 2023-02-20 DIAGNOSIS — Z781 Physical restraint status: Secondary | ICD-10-CM

## 2023-02-20 DIAGNOSIS — D6489 Other specified anemias: Secondary | ICD-10-CM | POA: Diagnosis not present

## 2023-02-20 DIAGNOSIS — L8990 Pressure ulcer of unspecified site, unspecified stage: Secondary | ICD-10-CM | POA: Insufficient documentation

## 2023-02-20 DIAGNOSIS — E7800 Pure hypercholesterolemia, unspecified: Secondary | ICD-10-CM | POA: Diagnosis present

## 2023-02-20 DIAGNOSIS — I9589 Other hypotension: Secondary | ICD-10-CM | POA: Diagnosis not present

## 2023-02-20 DIAGNOSIS — R6521 Severe sepsis with septic shock: Secondary | ICD-10-CM | POA: Diagnosis present

## 2023-02-20 DIAGNOSIS — J159 Unspecified bacterial pneumonia: Secondary | ICD-10-CM | POA: Diagnosis present

## 2023-02-20 DIAGNOSIS — L03317 Cellulitis of buttock: Secondary | ICD-10-CM | POA: Diagnosis present

## 2023-02-20 DIAGNOSIS — D509 Iron deficiency anemia, unspecified: Secondary | ICD-10-CM | POA: Diagnosis not present

## 2023-02-20 DIAGNOSIS — Z66 Do not resuscitate: Secondary | ICD-10-CM | POA: Diagnosis not present

## 2023-02-20 DIAGNOSIS — Z96642 Presence of left artificial hip joint: Secondary | ICD-10-CM | POA: Diagnosis present

## 2023-02-20 DIAGNOSIS — E874 Mixed disorder of acid-base balance: Secondary | ICD-10-CM | POA: Diagnosis present

## 2023-02-20 DIAGNOSIS — E861 Hypovolemia: Secondary | ICD-10-CM | POA: Diagnosis not present

## 2023-02-20 DIAGNOSIS — F32A Depression, unspecified: Secondary | ICD-10-CM | POA: Diagnosis present

## 2023-02-20 DIAGNOSIS — F17210 Nicotine dependence, cigarettes, uncomplicated: Secondary | ICD-10-CM | POA: Diagnosis present

## 2023-02-20 DIAGNOSIS — Z79899 Other long term (current) drug therapy: Secondary | ICD-10-CM

## 2023-02-20 DIAGNOSIS — J1108 Influenza due to unidentified influenza virus with specified pneumonia: Secondary | ICD-10-CM | POA: Diagnosis present

## 2023-02-20 DIAGNOSIS — Z1152 Encounter for screening for COVID-19: Secondary | ICD-10-CM

## 2023-02-20 DIAGNOSIS — Z823 Family history of stroke: Secondary | ICD-10-CM

## 2023-02-20 DIAGNOSIS — L409 Psoriasis, unspecified: Secondary | ICD-10-CM | POA: Diagnosis present

## 2023-02-20 DIAGNOSIS — J449 Chronic obstructive pulmonary disease, unspecified: Secondary | ICD-10-CM | POA: Diagnosis not present

## 2023-02-20 DIAGNOSIS — Z833 Family history of diabetes mellitus: Secondary | ICD-10-CM

## 2023-02-20 DIAGNOSIS — I10 Essential (primary) hypertension: Secondary | ICD-10-CM | POA: Diagnosis present

## 2023-02-20 DIAGNOSIS — Z882 Allergy status to sulfonamides status: Secondary | ICD-10-CM

## 2023-02-20 DIAGNOSIS — Z7189 Other specified counseling: Secondary | ICD-10-CM | POA: Diagnosis not present

## 2023-02-20 DIAGNOSIS — M726 Necrotizing fasciitis: Secondary | ICD-10-CM

## 2023-02-20 DIAGNOSIS — R739 Hyperglycemia, unspecified: Secondary | ICD-10-CM | POA: Diagnosis not present

## 2023-02-20 DIAGNOSIS — N170 Acute kidney failure with tubular necrosis: Secondary | ICD-10-CM | POA: Diagnosis present

## 2023-02-20 DIAGNOSIS — Z1629 Resistance to other single specified antibiotic: Secondary | ICD-10-CM | POA: Diagnosis present

## 2023-02-20 DIAGNOSIS — A4151 Sepsis due to Escherichia coli [E. coli]: Secondary | ICD-10-CM | POA: Diagnosis not present

## 2023-02-20 DIAGNOSIS — Z6841 Body Mass Index (BMI) 40.0 and over, adult: Secondary | ICD-10-CM

## 2023-02-20 DIAGNOSIS — G9341 Metabolic encephalopathy: Secondary | ICD-10-CM | POA: Diagnosis present

## 2023-02-20 DIAGNOSIS — E876 Hypokalemia: Secondary | ICD-10-CM | POA: Diagnosis not present

## 2023-02-20 DIAGNOSIS — I959 Hypotension, unspecified: Principal | ICD-10-CM

## 2023-02-20 DIAGNOSIS — F17200 Nicotine dependence, unspecified, uncomplicated: Secondary | ICD-10-CM | POA: Diagnosis present

## 2023-02-20 DIAGNOSIS — M1990 Unspecified osteoarthritis, unspecified site: Secondary | ICD-10-CM | POA: Diagnosis present

## 2023-02-20 DIAGNOSIS — J189 Pneumonia, unspecified organism: Secondary | ICD-10-CM | POA: Insufficient documentation

## 2023-02-20 DIAGNOSIS — R001 Bradycardia, unspecified: Secondary | ICD-10-CM | POA: Diagnosis not present

## 2023-02-20 DIAGNOSIS — Z1611 Resistance to penicillins: Secondary | ICD-10-CM | POA: Diagnosis present

## 2023-02-20 DIAGNOSIS — E871 Hypo-osmolality and hyponatremia: Secondary | ICD-10-CM | POA: Diagnosis present

## 2023-02-20 DIAGNOSIS — Z515 Encounter for palliative care: Secondary | ICD-10-CM

## 2023-02-20 DIAGNOSIS — G44009 Cluster headache syndrome, unspecified, not intractable: Secondary | ICD-10-CM | POA: Diagnosis present

## 2023-02-20 DIAGNOSIS — D649 Anemia, unspecified: Secondary | ICD-10-CM | POA: Diagnosis not present

## 2023-02-20 DIAGNOSIS — D638 Anemia in other chronic diseases classified elsewhere: Secondary | ICD-10-CM | POA: Diagnosis not present

## 2023-02-20 DIAGNOSIS — Z8249 Family history of ischemic heart disease and other diseases of the circulatory system: Secondary | ICD-10-CM

## 2023-02-20 DIAGNOSIS — D6959 Other secondary thrombocytopenia: Secondary | ICD-10-CM | POA: Diagnosis present

## 2023-02-20 DIAGNOSIS — E039 Hypothyroidism, unspecified: Secondary | ICD-10-CM | POA: Diagnosis not present

## 2023-02-20 DIAGNOSIS — Z96652 Presence of left artificial knee joint: Secondary | ICD-10-CM | POA: Diagnosis present

## 2023-02-20 DIAGNOSIS — K589 Irritable bowel syndrome without diarrhea: Secondary | ICD-10-CM | POA: Diagnosis present

## 2023-02-20 DIAGNOSIS — J9600 Acute respiratory failure, unspecified whether with hypoxia or hypercapnia: Secondary | ICD-10-CM | POA: Diagnosis not present

## 2023-02-20 HISTORY — PX: IRRIGATION AND DEBRIDEMENT BUTTOCKS: SHX6601

## 2023-02-20 LAB — PROTIME-INR
INR: 1.3 — ABNORMAL HIGH (ref 0.8–1.2)
Prothrombin Time: 16.3 seconds — ABNORMAL HIGH (ref 11.4–15.2)

## 2023-02-20 LAB — RESP PANEL BY RT-PCR (RSV, FLU A&B, COVID)  RVPGX2
Influenza A by PCR: NEGATIVE
Influenza B by PCR: NEGATIVE
Resp Syncytial Virus by PCR: NEGATIVE
SARS Coronavirus 2 by RT PCR: NEGATIVE

## 2023-02-20 LAB — CBC
HCT: 33 % — ABNORMAL LOW (ref 36.0–46.0)
HCT: 32.5 % — ABNORMAL LOW (ref 36.0–46.0)
Hemoglobin: 11.2 g/dL — ABNORMAL LOW (ref 12.0–15.0)
Hemoglobin: 10.4 g/dL — ABNORMAL LOW (ref 12.0–15.0)
MCH: 29.2 pg (ref 26.0–34.0)
MCH: 28.4 pg (ref 26.0–34.0)
MCHC: 33.9 g/dL (ref 30.0–36.0)
MCHC: 32 g/dL (ref 30.0–36.0)
MCV: 85.9 fL (ref 80.0–100.0)
MCV: 88.8 fL (ref 80.0–100.0)
Platelets: 147 10*3/uL — ABNORMAL LOW (ref 150–400)
Platelets: 151 10*3/uL (ref 150–400)
RBC: 3.84 MIL/uL — ABNORMAL LOW (ref 3.87–5.11)
RBC: 3.66 MIL/uL — ABNORMAL LOW (ref 3.87–5.11)
RDW: 13.8 % (ref 11.5–15.5)
RDW: 14.1 % (ref 11.5–15.5)
WBC: 11.5 10*3/uL — ABNORMAL HIGH (ref 4.0–10.5)
WBC: 13.5 10*3/uL — ABNORMAL HIGH (ref 4.0–10.5)
nRBC: 0 % (ref 0.0–0.2)
nRBC: 0 % (ref 0.0–0.2)

## 2023-02-20 LAB — COMPREHENSIVE METABOLIC PANEL
ALT: 13 U/L (ref 0–44)
AST: 20 U/L (ref 15–41)
Albumin: 2.5 g/dL — ABNORMAL LOW (ref 3.5–5.0)
Alkaline Phosphatase: 87 U/L (ref 38–126)
Anion gap: 11 (ref 5–15)
BUN: 35 mg/dL — ABNORMAL HIGH (ref 8–23)
CO2: 18 mmol/L — ABNORMAL LOW (ref 22–32)
Calcium: 7.9 mg/dL — ABNORMAL LOW (ref 8.9–10.3)
Chloride: 98 mmol/L (ref 98–111)
Creatinine, Ser: 1.95 mg/dL — ABNORMAL HIGH (ref 0.44–1.00)
GFR, Estimated: 28 mL/min — ABNORMAL LOW (ref >60)
Glucose, Bld: 122 mg/dL — ABNORMAL HIGH (ref 70–99)
Potassium: 3.6 mmol/L (ref 3.5–5.1)
Sodium: 127 mmol/L — ABNORMAL LOW (ref 135–145)
Total Bilirubin: 1 mg/dL (ref 0.3–1.2)
Total Protein: 5.6 g/dL — ABNORMAL LOW (ref 6.5–8.1)

## 2023-02-20 LAB — CREATININE, SERUM
Creatinine, Ser: 1.74 mg/dL — ABNORMAL HIGH (ref 0.44–1.00)
GFR, Estimated: 32 mL/min — ABNORMAL LOW (ref >60)

## 2023-02-20 LAB — LACTIC ACID, PLASMA: Lactic Acid, Venous: 1.7 mmol/L (ref 0.5–1.9)

## 2023-02-20 LAB — APTT: aPTT: 31 seconds (ref 24–36)

## 2023-02-20 SURGERY — IRRIGATION AND DEBRIDEMENT BUTTOCKS
Anesthesia: General

## 2023-02-20 MED ORDER — ACETAMINOPHEN 325 MG PO TABS
650.0000 mg | ORAL_TABLET | Freq: Four times a day (QID) | ORAL | Status: DC | PRN
  Administered 2023-02-20: 650 mg via ORAL
  Filled 2023-02-20: qty 2

## 2023-02-20 MED ORDER — POLYETHYLENE GLYCOL 3350 17 G PO PACK: 17.0000 g | PACK | Freq: Every day | ORAL | Status: DC | PRN

## 2023-02-20 MED ORDER — LACTATED RINGERS IV BOLUS (SEPSIS)
1000.0000 mL | Freq: Once | INTRAVENOUS | Status: AC
  Administered 2023-02-20: 1000 mL via INTRAVENOUS

## 2023-02-20 MED ORDER — EPINEPHRINE PF 1 MG/ML IJ SOLN
INTRAMUSCULAR | Status: AC
  Filled 2023-02-20: qty 1

## 2023-02-20 MED ORDER — BUPIVACAINE HCL (PF) 0.25 % IJ SOLN
INTRAMUSCULAR | Status: AC
  Filled 2023-02-20: qty 30

## 2023-02-20 MED ORDER — NOREPINEPHRINE 4 MG/250ML-% IV SOLN
0.0000 ug/min | INTRAVENOUS | Status: DC
  Administered 2023-02-20: 2 ug/min via INTRAVENOUS
  Administered 2023-02-21: 8 ug/min via INTRAVENOUS
  Administered 2023-02-21: 5 ug/min via INTRAVENOUS
  Administered 2023-02-23: 2 ug/min via INTRAVENOUS
  Administered 2023-02-25: 3 ug/min via INTRAVENOUS
  Filled 2023-02-20 (×5): qty 250

## 2023-02-20 MED ORDER — INSULIN ASPART 100 UNIT/ML IJ SOLN
0.0000 [IU] | INTRAMUSCULAR | Status: DC
  Administered 2023-02-21 (×2): 2 [IU] via SUBCUTANEOUS
  Administered 2023-02-21: 3 [IU] via SUBCUTANEOUS
  Administered 2023-02-21: 2 [IU] via SUBCUTANEOUS
  Administered 2023-02-21: 1 [IU] via SUBCUTANEOUS
  Administered 2023-02-21: 2 [IU] via SUBCUTANEOUS
  Administered 2023-02-22: 1 [IU] via SUBCUTANEOUS
  Administered 2023-02-22: 2 [IU] via SUBCUTANEOUS
  Administered 2023-02-22: 1 [IU] via SUBCUTANEOUS
  Administered 2023-02-23: 2 [IU] via SUBCUTANEOUS
  Administered 2023-02-24 – 2023-02-25 (×4): 1 [IU] via SUBCUTANEOUS
  Administered 2023-02-26: 2 [IU] via SUBCUTANEOUS
  Administered 2023-02-26 (×4): 1 [IU] via SUBCUTANEOUS
  Administered 2023-02-27: 2 [IU] via SUBCUTANEOUS
  Administered 2023-02-27 (×2): 1 [IU] via SUBCUTANEOUS
  Administered 2023-02-27: 2 [IU] via SUBCUTANEOUS
  Administered 2023-02-28: 1 [IU] via SUBCUTANEOUS
  Administered 2023-02-28 (×4): 2 [IU] via SUBCUTANEOUS

## 2023-02-20 MED ORDER — PROPOFOL 10 MG/ML IV BOLUS
INTRAVENOUS | Status: AC
  Filled 2023-02-20: qty 20

## 2023-02-20 MED ORDER — HEPARIN SODIUM (PORCINE) 5000 UNIT/ML IJ SOLN
5000.0000 [IU] | Freq: Three times a day (TID) | INTRAMUSCULAR | Status: DC
  Administered 2023-02-20 – 2023-02-25 (×14): 5000 [IU] via SUBCUTANEOUS
  Filled 2023-02-20 (×14): qty 1

## 2023-02-20 MED ORDER — CLINDAMYCIN PHOSPHATE 600 MG/50ML IV SOLN
600.0000 mg | Freq: Once | INTRAVENOUS | Status: AC
  Administered 2023-02-20: 600 mg via INTRAVENOUS
  Filled 2023-02-20: qty 50

## 2023-02-20 MED ORDER — DOCUSATE SODIUM 100 MG PO CAPS
100.0000 mg | ORAL_CAPSULE | Freq: Two times a day (BID) | ORAL | Status: DC | PRN
  Administered 2023-02-20: 100 mg via ORAL
  Filled 2023-02-20: qty 1

## 2023-02-20 MED ORDER — LACTATED RINGERS IV SOLN
INTRAVENOUS | Status: DC
  Administered 2023-02-20: 1010 mL via INTRAVENOUS

## 2023-02-20 MED ORDER — LACTATED RINGERS IV BOLUS (SEPSIS)
500.0000 mL | Freq: Once | INTRAVENOUS | Status: AC
  Administered 2023-02-20: 500 mL via INTRAVENOUS

## 2023-02-20 MED ORDER — FENTANYL CITRATE (PF) 250 MCG/5ML IJ SOLN
INTRAMUSCULAR | Status: AC
  Filled 2023-02-20: qty 5

## 2023-02-20 MED ORDER — SODIUM CHLORIDE 0.9 % IV SOLN
2.0000 g | Freq: Once | INTRAVENOUS | Status: AC
  Administered 2023-02-20: 2 g via INTRAVENOUS
  Filled 2023-02-20: qty 20

## 2023-02-20 MED ORDER — LACTATED RINGERS IV SOLN: INTRAVENOUS | Status: DC

## 2023-02-20 MED ORDER — VANCOMYCIN VARIABLE DOSE PER UNSTABLE RENAL FUNCTION (PHARMACIST DOSING): Status: DC

## 2023-02-20 MED ORDER — VANCOMYCIN HCL 2000 MG/400ML IV SOLN
2000.0000 mg | Freq: Once | INTRAVENOUS | Status: AC
  Administered 2023-02-20: 2000 mg via INTRAVENOUS
  Filled 2023-02-20: qty 400

## 2023-02-20 MED ORDER — SODIUM CHLORIDE 0.9 % IV SOLN
2.0000 g | Freq: Two times a day (BID) | INTRAVENOUS | Status: DC
  Administered 2023-02-20: 2 g via INTRAVENOUS
  Filled 2023-02-20: qty 12.5

## 2023-02-20 MED ORDER — VANCOMYCIN HCL IN DEXTROSE 1-5 GM/200ML-% IV SOLN: 1000.0000 mg | Freq: Once | INTRAVENOUS | Status: DC

## 2023-02-20 MED ORDER — MIDAZOLAM HCL 2 MG/2ML IJ SOLN
INTRAMUSCULAR | Status: AC
  Filled 2023-02-20: qty 2

## 2023-02-20 SURGICAL SUPPLY — 39 items
APL PRP STRL LF DISP 70% ISPRP (MISCELLANEOUS)
BAG COUNTER SPONGE SURGICOUNT (BAG) ×2 IMPLANT
BAG SPNG CNTER NS LX DISP (BAG)
BNDG GAUZE DERMACEA FLUFF 4 (GAUZE/BANDAGES/DRESSINGS) IMPLANT
BNDG GZE DERMACEA 4 6PLY (GAUZE/BANDAGES/DRESSINGS) ×1
CANISTER SUCT 3000ML PPV (MISCELLANEOUS) IMPLANT
CHLORAPREP W/TINT 26 (MISCELLANEOUS) IMPLANT
CLEANER TIP ELECTROSURG 2X2 (MISCELLANEOUS) ×2 IMPLANT
COVER SURGICAL LIGHT HANDLE (MISCELLANEOUS) ×2 IMPLANT
DRAPE HALF SHEET 40X57 (DRAPES) IMPLANT
DRAPE LAPAROSCOPIC ABDOMINAL (DRAPES) ×2 IMPLANT
ELECT REM PT RETURN 9FT ADLT (ELECTROSURGICAL) ×1
ELECTRODE REM PT RTRN 9FT ADLT (ELECTROSURGICAL) ×2 IMPLANT
GAUZE 4X4 16PLY ~~LOC~~+RFID DBL (SPONGE) IMPLANT
GAUZE PAD ABD 8X10 STRL (GAUZE/BANDAGES/DRESSINGS) IMPLANT
GAUZE SPONGE 4X4 12PLY STRL (GAUZE/BANDAGES/DRESSINGS) ×2 IMPLANT
GLOVE BIO SURGEON STRL SZ7 (GLOVE) ×2 IMPLANT
GLOVE BIOGEL PI IND STRL 7.5 (GLOVE) ×2 IMPLANT
GOWN STRL REUS W/ TWL LRG LVL3 (GOWN DISPOSABLE) ×4 IMPLANT
GOWN STRL REUS W/TWL LRG LVL3 (GOWN DISPOSABLE) ×2
KIT BASIN OR (CUSTOM PROCEDURE TRAY) ×2 IMPLANT
KIT TURNOVER KIT B (KITS) ×2 IMPLANT
NDL HYPO 25GX1X1/2 BEV (NEEDLE) IMPLANT
NEEDLE HYPO 25GX1X1/2 BEV (NEEDLE) IMPLANT
NS IRRIG 1000ML POUR BTL (IV SOLUTION) ×2 IMPLANT
PACK GENERAL/GYN (CUSTOM PROCEDURE TRAY) ×2 IMPLANT
PAD ABD 8X10 STRL (GAUZE/BANDAGES/DRESSINGS) IMPLANT
PAD ARMBOARD 7.5X6 YLW CONV (MISCELLANEOUS) ×4 IMPLANT
PENCIL SMOKE EVACUATOR (MISCELLANEOUS) ×2 IMPLANT
SPECIMEN JAR SMALL (MISCELLANEOUS) ×2 IMPLANT
SPIKE FLUID TRANSFER (MISCELLANEOUS) IMPLANT
SUT MNCRL AB 4-0 PS2 18 (SUTURE) IMPLANT
SUT VIC AB 3-0 SH 27 (SUTURE) ×1
SUT VIC AB 3-0 SH 27XBRD (SUTURE) IMPLANT
SWAB COLLECTION DEVICE MRSA (MISCELLANEOUS) IMPLANT
SWAB CULTURE ESWAB REG 1ML (MISCELLANEOUS) IMPLANT
SYR CONTROL 10ML LL (SYRINGE) IMPLANT
TOWEL GREEN STERILE (TOWEL DISPOSABLE) ×2 IMPLANT
TOWEL GREEN STERILE FF (TOWEL DISPOSABLE) ×2 IMPLANT

## 2023-02-20 NOTE — Assessment & Plan Note (Addendum)
Patient here is primary concern of dizziness. She was diagnosed with the flu this past weekend and has been unable to keep down food or drinks. She is alert and oriented on exam. She is pale, tachycardic, hypotensive, febrile, and tachypneic in office today. EMS called for transport to emergency room. IV started and 1 liter normal saline administered while EMS was en route.

## 2023-02-20 NOTE — H&P (Signed)
NAME:  ALVINE DAHMER, MRN:  [id number], DOB:  30-Nov-1956, LOS: 0 ADMISSION DATE:  02/20/2023, CONSULTATION DATE:  02/20/23 REFERRING MD:  EDP, CHIEF COMPLAINT:   dizzy, hypotensive   History of Present Illness:  Ryenne Clifford is a 67 y.o. F with PMH significant for obesity, depression, headaches, osteoarthritis and tobacco use who presents to the ED with dizziness and low blood pressure.  She began feeling poorly about one week ago when she noticed a sore on her buttock, this has grown and redness and pain has spread to both buttocks with some purulent drainage.   She then developed a cough and was diagnosed with the flu on 3/17 and has felt short of breath with nausea and vomiting and very little po intake for the last few days.  She denies chest pain, leg edema or pain.     In the ED, she was given 3.5L IVF and broad spectrum abx.  CXR consistent with LLL pneumonia.  Labs showed lactic acid of 1.7, Na 127, creatinine 1.95, WBC 115, platelets 147, WBC 11.5.   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   Significant Hospital Events: Including procedures, antibiotic start and stop dates in addition to other pertinent events   3/20 presented with cellulitis, PNA and septic shock, PCCM admit, started on levophed  Interim History / Subjective:  CT abd/pelvis resulted, consistent with Fournier's Gangrene Cardiac POCUS with good EF   Objective   Blood pressure 118/71, pulse 92, temperature 98.7 F (37.1 C), temperature source Oral, resp. rate (!) 30, SpO2 94 %.        Intake/Output  Summary (Last 24 hours) at 02/20/2023 2219 Last data filed at 02/20/2023 2205 Gross per 24 hour  Intake 2933.3 ml  Output --  Net 2933.3 ml   There were no vitals filed for this visit.  General:  chronically and acutely ill-appearing F, in mild respiratory distress HEENT: MM pink/dry, sclera anicteric Neuro: awake, alert and oriented CV: s1s2 tachycardic, regualr, no m/r/g PULM:  on 2L O2 with tachypnea, no significant accessory muscle use, mild basilar crackles GI: soft, abdomen non-tender, bilateral medial gluteal erythema and induration with diffuse weeping  Extremities: warm/dry, no edema  Skin: no rashes or lesions   Resolved Hospital Problem list     Assessment & Plan:   Septic shock secondary to Fournier's Gangrene NAGMA Acidosis possibly secondary to diarrhea and renal failure -admit to ICU -clindamycin, vanc, cefepime given -blood cultures -general surgery consulted, going from ED directly to OR for debridement  -received 30cc/kg IVF, continue LR @75cc /hr -continue levophed, add vaso, will need CVC and arterial line -trend lactic acid and BMP   Acute Hypoxic Respiratory failure secondary to flu and LLL pneumonia Tobacco use -flu negative here, diagnosed outpatient, did not receive tamiflu -requiring 2L [place] O2 but at high risk for decompensation -broad spectrum abx as above -urine strep and legionella  AKI  Creatinine 1.95, <1 at baseline -likely secondary to pre-renal volume depletion and shock -IVF, follow BMP and electrolytes -adequate renal perfusion and avoid nephrotoxins   Hyponatremia Likely secondary to volume depletion -monitor BMP after IVF, goal 4-6 meq correction in first 24hrs   Thrombocytopenia  Platelets 146 -secondary to septic shock -follow CBC, transfuse prn, monitor for signs of bleeding  Best Practice (right click and "Reselect all SmartList Selections" daily)   Diet/type: NPO DVT prophylaxis: prophylactic heparin  GI  prophylaxis: N/A Lines: N/A Foley:  N/A Code Status:  full code Last date of multidisciplinary goals of care discussion [pending, family updated at the bedside]  Labs   CBC: Recent Labs  Lab 02/20/23 1806  WBC 11.5*  HGB 11.2*  HCT 33.0*  MCV 85.9  PLT 147*    Basic Metabolic Panel: Recent Labs  Lab 02/20/23 1806  NA 127*  K 3.6  CL 98  CO2 18*  GLUCOSE 122*  BUN 35*  CREATININE 1.95*  CALCIUM 7.9*   GFR: Estimated Creatinine Clearance: 31 mL/min (A) (by C-G formula based on SCr of 1.95 mg/dL (H)). Recent Labs  Lab 02/20/23 1806 02/20/23 1823  WBC 11.5*  --   LATICACIDVEN  --  1.7    Liver Function Tests: Recent Labs  Lab 02/20/23 1806  AST 20  ALT 13  ALKPHOS 87  BILITOT 1.0  PROT 5.6*  ALBUMIN 2.5*   No results for input(s): "LIPASE", "AMYLASE" in the last 168 hours. No results for input(s): "AMMONIA" in the last 168 hours.  ABG    Component Value Date/Time   TCO2 21 08/29/2010 1443     Coagulation Profile: Recent Labs  Lab 02/20/23 1823  INR 1.3*    Cardiac Enzymes: No results for input(s): "CKTOTAL", "CKMB", "CKMBINDEX", "TROPONINI" in the last 168 hours.  HbA1C: Hgb A1c MFr Bld  Date/Time Value Ref Range Status  01/21/2023 10:08 AM 5.8 (H) 4.8 - 5.6 % Final    Comment:             Prediabetes: 5.7 - 6.4          Diabetes: >6.4          Glycemic control for adults with diabetes: <7.0   07/26/2022 09:51 AM 5.9 (H) 4.8 - 5.6 % Final    Comment:             Prediabetes: 5.7 - 6.4          Diabetes: >6.4          Glycemic control for adults with diabetes: <7.0     CBG: No results for input(s): "GLUCAP" in the last 168 hours.  Review of Systems:   Please see the history of present illness. 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   Surgical History:   Past Surgical History:  Procedure Laterality Date   BREAST BIOPSY Right 1990   BREAST EXCISIONAL BIOPSY Right 1994   JOINT REPLACEMENT     TOTAL HIP ARTHROPLASTY Left 06/08/2016   Procedure: LEFT TOTAL HIP ARTHROPLASTY ANTERIOR APPROACH;  Surgeon: Mcarthur Rossetti, MD;  Location: [facility];  Service: Orthopedics;  Laterality: Left;   TOTAL KNEE ARTHROPLASTY Left 11/07/2018   Procedure: LEFT TOTAL KNEE ARTHROPLASTY;  Surgeon: Mcarthur Rossetti, MD;  Location: [facility];  Service: Orthopedics;  Laterality: Left;     Social History:   reports that she has been smoking cigarettes. She has a 21.00 pack-year smoking history. She has never used  smokeless tobacco. She reports that she does not drink alcohol and does not use drugs.   Family History:  Her family history includes Diabetes in her brother, father, and sister; Heart disease in her maternal grandfather and maternal grandmother; High Cholesterol in her mother; Hypertension in her mother; Obesity in her father; Stroke in her mother; Thyroid disease in her father. There is no history of Colon cancer.   Allergies Allergies  Allergen Reactions   Sulfa Antibiotics Itching   Sulfonamide Derivatives Itching     Home Medications  Prior to Admission medications   Medication Sig Start Date End Date Taking? Authorizing Provider  acetaminophen (TYLENOL) 500 MG tablet Take 1,000 mg by mouth 2 (two) times daily as needed for moderate pain or headache.     [provider]  atorvastatin (LIPITOR) 20 MG tablet TAKE 1 TABLET BY MOUTH EVERY DAY 09/14/22   [name], MD  buPROPion [facility] SR) 150 MG 12 hr tablet 1 po q am, and 1 po q mid-afternoon 02/14/23   [name], MD  citalopram (CELEXA) 20 MG tablet  Take 1 tablet (20 mg total) by mouth daily. 06/22/22   [name], MD  diltiazem (CARDIZEM CD) 180 MG 24 hr capsule TAKE 1 CAPSULE BY MOUTH EVERY DAY 07/10/22   [name], MD  Multiple Vitamins-Minerals (MULTIVITAMIN ADULT) CHEW Chew 2 each by mouth daily.    [provider]  naproxen sodium (ALEVE) 220 MG tablet Take 220 mg by mouth.    [provider]  traZODone (DESYREL) 100 MG tablet Take 1 tablet (100 mg total) by mouth at bedtime as needed for sleep. 12/31/22   Opalski, Neoma Laming, DO  Vitamin D, Ergocalciferol, (DRISDOL) 1.25 MG (50000 UNIT) CAPS capsule Take 1 capsule (50,000 Units total) by mouth every 7 (seven) days. 02/11/23   Mellody Dance, DO     Critical care time:  45 minutes    CRITICAL CARE Performed by: Otilio Carpen Catia Todorov   Total critical care time: 45 minutes  Critical care time was exclusive of separately billable procedures and treating other patients.  Critical care was necessary to treat or prevent imminent or life-threatening deterioration.  Critical care was time spent personally by me on the following activities: development of treatment plan with patient and/or surrogate as well as nursing, discussions with consultants, evaluation of patient's response to treatment, examination of patient, obtaining history from patient or surrogate, ordering and performing treatments and interventions, ordering and review of laboratory studies, ordering and review of radiographic studies, pulse oximetry and re-evaluation of patient's condition.  Otilio Carpen Zelma Mazariego, PA-C [facility] See Amion for pager If no response to pager , please call [phone] until 7pm After 7:00 pm call Elink  [phone]?[address]

## 2023-02-20 NOTE — ED Triage Notes (Signed)
Pt here from the UC with c/o dizzy and low b/p 80/40 . Got fluids with ems , b/p up on arrival , pt was dx with the flu on Sunday along with being placed on antibiotics for a wound on her buttox

## 2023-02-20 NOTE — Progress Notes (Signed)
Acute Office Visit  Subjective:     Patient ID: Wendy Crawford, female    DOB: [birth year], 67 y.o.   MRN: [id number]  Chief Complaint  Patient presents with   Acute Visit    having dizzy spells/since having flu    HPI Patient is in today for dizziness, fatigue, nausea, diarrhea, fevers, chills since Monday. Unable to keep food down. Diagnosed with flu Sunday. Is on antibiotic since Sunday for cyst on her buttock.  Review of Systems  All other systems reviewed and are negative.   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)    Colitis    ulcerative   Depression    Headache(784.0)    most severe for 2 months,cluster   Headaches, cluster    02 and medication   High cholesterol    Irritable bowel    Joint pain    Knee pain    Obesity    Osteoarthritis    Pneumonia YRS AGO   Primary snoring 06/17/2014   Psoriasis    Ulcerative colitis ([place])    Past Surgical History:  Procedure Laterality Date   BREAST BIOPSY Right 1990   BREAST EXCISIONAL BIOPSY Right 1994   JOINT REPLACEMENT     TOTAL HIP ARTHROPLASTY Left 06/08/2016   Procedure: LEFT TOTAL HIP ARTHROPLASTY ANTERIOR APPROACH;  Surgeon: Mcarthur Rossetti, MD;  Location: [facility];  Service: Orthopedics;  Laterality: Left;   TOTAL KNEE ARTHROPLASTY Left 11/07/2018   Procedure: LEFT TOTAL KNEE ARTHROPLASTY;  Surgeon: Mcarthur Rossetti, MD;  Location: [facility];  Service: Orthopedics;  Laterality: Left;   Current Outpatient Medications on File Prior to Visit  Medication Sig Dispense Refill   acetaminophen (TYLENOL) 500 MG tablet Take 1,000 mg by mouth 2 (two) times daily as needed for moderate pain or headache.      atorvastatin (LIPITOR) 20 MG tablet TAKE 1 TABLET BY MOUTH EVERY DAY 90 tablet 3   buPROPion (WELLBUTRIN SR) 150 MG 12 hr tablet 1 po q am, and 1 po q mid-afternoon 180 tablet 1   citalopram (CELEXA) 20 MG tablet  Take 1 tablet (20 mg total) by mouth daily. 90 tablet 3   diltiazem (CARDIZEM CD) 180 MG 24 hr capsule TAKE 1 CAPSULE BY MOUTH EVERY DAY 90 capsule 3   Multiple Vitamins-Minerals (MULTIVITAMIN ADULT) CHEW Chew 2 each by mouth daily.     naproxen sodium (ALEVE) 220 MG tablet Take 220 mg by mouth.     traZODone (DESYREL) 100 MG tablet Take 1 tablet (100 mg total) by mouth at bedtime as needed for sleep. 30 tablet 0   Vitamin D, Ergocalciferol, (DRISDOL) 1.25 MG (50000 UNIT) CAPS capsule Take 1 capsule (50,000 Units total) by mouth every 7 (seven) days. 5 capsule 0   No current facility-administered medications on file prior to visit.   Allergies  Allergen Reactions   Sulfa Antibiotics Itching   Sulfonamide Derivatives Itching        Objective:    BP (!) 84/42 (BP Location: Right Arm, Patient Position: Sitting, Cuff Size: Large)   Pulse (!) 115   Temp 100 F (37.8 C) (Oral)   Ht 5\' 1"  (1.549 m)   Wt 224 lb (101.6 kg)   SpO2 92%   BMI 42.32 kg/m    Physical Exam Vitals and nursing note reviewed.  Constitutional:      Appearance: Normal appearance. She  is obese. She is ill-appearing.  HENT:     Head: Normocephalic and atraumatic.  Cardiovascular:     Rate and Rhythm: Regular rhythm. Tachycardia present.     Pulses: Normal pulses.     Heart sounds: Normal heart sounds.  Pulmonary:     Effort: Tachypnea and accessory muscle usage present.     Breath sounds: Examination of the right-upper field reveals rhonchi. Examination of the right-lower field reveals rhonchi. Examination of the left-lower field reveals rhonchi. Rhonchi and rales present.  Skin:    General: Skin is warm and dry.  Neurological:     General: No focal deficit present.     Mental Status: She is alert and oriented to person, place, and time. Mental status is at baseline.  Psychiatric:        Mood and Affect: Mood normal.        Behavior: Behavior normal.        Thought Content: Thought content normal.         Judgment: Judgment normal.     No results found for any visits on 02/20/23.      Assessment & Plan:   Problem List Items Addressed This Visit       Cardiovascular and Mediastinum   Hypotension due to hypovolemia - Primary    Patient here is primary concern of dizziness. She was diagnosed with the flu this past weekend and has been unable to keep down food or drinks. She is alert and oriented on exam. She is pale, tachycardic, hypotensive, febrile, and tachypneic in office today. EMS called for transport to emergency room. IV started and 1 liter normal saline administered while EMS was en route.       No orders of the defined types were placed in this encounter.   No follow-ups on file.  Rubie Maid, FNP

## 2023-02-20 NOTE — Anesthesia Preprocedure Evaluation (Signed)
Anesthesia Evaluation  Patient identified by MRN, date of birth, ID band Patient awake    Reviewed: Allergy & Precautions, NPO status , Patient's Chart, lab work & pertinent test results  Airway Mallampati: II  TM Distance: >3 FB Neck ROM: Full    Dental  (+) Missing, Partial Lower, Upper Dentures   Pulmonary COPD, Current Smoker and Patient abstained from smoking.   Pulmonary exam normal        Cardiovascular negative cardio ROS Normal cardiovascular exam     Neuro/Psych  Headaches PSYCHIATRIC DISORDERS  Depression       GI/Hepatic negative GI ROS, Neg liver ROS,,,  Endo/Other    Morbid obesityPRE-DM hyponatremia  Renal/GU Renal disease     Musculoskeletal  (+) Arthritis ,    Abdominal  (+) + obese  Peds  Hematology  (+) Blood dyscrasia, anemia   Anesthesia Other Findings Fournier gangrene  Reproductive/Obstetrics                             Anesthesia Physical Anesthesia Plan  ASA: 4 and emergent  Anesthesia Plan: General   Post-op Pain Management:    Induction: Intravenous  PONV Risk Score and Plan: 2 and Ondansetron, Dexamethasone, Midazolam and Treatment may vary due to age or medical condition  Airway Management Planned: Oral ETT  Additional Equipment: Arterial line, CVP and Ultrasound Guidance Line Placement  Intra-op Plan:   Post-operative Plan: Possible Post-op intubation/ventilation  Informed Consent: I have reviewed the patients History and Physical, chart, labs and discussed the procedure including the risks, benefits and alternatives for the proposed anesthesia with the patient or authorized representative who has indicated his/her understanding and acceptance.     Dental advisory given  Plan Discussed with: CRNA  Anesthesia Plan Comments:         Anesthesia Quick Evaluation

## 2023-02-20 NOTE — ED Provider Notes (Signed)
[facility] Provider Note   CSN: [id number] Arrival date & time: 02/20/23  1734     History  No chief complaint on file.   Wendy Crawford is a 67 y.o. female.  Patient complains of a cough and congestion.  Patient reports that she was diagnosed with influenza on Sunday.  Patient reports that she also has an infected area on her bottom.  Patient reports it started out as a small area of redness and hardness and has increased in size.  Patient reports she was seen at urgent care and started on an antibiotic for infection.  Patient reports she saw her primary care physician today who sent her to the emergency department for evaluation. Pt  was given a liter of normal saline in the primary care doctor's office for low blood pressure.  Primary care called EMS to transport patient to the emergency department.  The history is provided by the patient. No language interpreter was used.       Home Medications Prior to Admission medications   Medication Sig Start Date End Date Taking? Authorizing Provider  acetaminophen (TYLENOL) 500 MG tablet Take 1,000 mg by mouth 2 (two) times daily as needed for moderate pain or headache.     [provider]  atorvastatin (LIPITOR) 20 MG tablet TAKE 1 TABLET BY MOUTH EVERY DAY 09/14/22   [name], MD  buPROPion [facility] SR) 150 MG 12 hr tablet 1 po q am, and 1 po q mid-afternoon 02/14/23   [name], MD  citalopram (CELEXA) 20 MG tablet Take 1 tablet (20 mg total) by mouth daily. 06/22/22   [name], MD  diltiazem (CARDIZEM CD) 180 MG 24 hr capsule TAKE 1 CAPSULE BY MOUTH EVERY DAY 07/10/22   [name], MD  Multiple Vitamins-Minerals (MULTIVITAMIN ADULT) CHEW Chew 2 each by mouth daily.    [provider]  naproxen sodium (ALEVE) 220 MG tablet Take 220 mg by mouth.    [provider]  traZODone (DESYREL) 100 MG tablet Take 1 tablet (100 mg total) by  mouth at bedtime as needed for sleep. 12/31/22   Opalski, Neoma Laming, DO  Vitamin D, Ergocalciferol, (DRISDOL) 1.25 MG (50000 UNIT) CAPS capsule Take 1 capsule (50,000 Units total) by mouth every 7 (seven) days. 02/11/23   Mellody Dance, DO      Allergies    Sulfa antibiotics and Sulfonamide derivatives    Review of Systems   Review of Systems  Constitutional:  Positive for fever.  Respiratory:  Positive for shortness of breath.   Skin:  Positive for wound.  All other systems reviewed and are negative.   Physical Exam Updated Vital Signs BP (!) 78/43   Pulse 91   Temp 98.7 F (37.1 C) (Oral)   Resp (!) 31   SpO2 91%  Physical Exam Vitals and nursing note reviewed.  Constitutional:      Appearance: She is well-developed.  HENT:     Head: Normocephalic.     Right Ear: External ear normal.     Left Ear: External ear normal.     Nose: Nose normal.     Mouth/Throat:     Mouth: Mucous membranes are moist.  Eyes:     Extraocular Movements: Extraocular movements intact.     Pupils: Pupils are equal, round, and reactive to light.  Cardiovascular:     Rate and Rhythm: Normal rate.  Pulmonary:     Effort: Pulmonary  effort is normal.     Breath sounds: Rhonchi present.  Abdominal:     General: There is no distension.  Musculoskeletal:        General: Normal range of motion.     Cervical back: Normal range of motion.  Skin:    Comments: Reddened firm area bilateral buttocks, 2 cm discolored area, drainage in pullup,   Neurological:     General: No focal deficit present.     Mental Status: She is alert and oriented to person, place, and time.     ED Results / Procedures / Treatments   Labs (all labs ordered are listed, but only abnormal results are displayed) Labs Reviewed  CBC - Abnormal; Notable for the following components:      Result Value   WBC 11.5 (*)    RBC 3.84 (*)    Hemoglobin 11.2 (*)    HCT 33.0 (*)    Platelets 147 (*)    All other components within  normal limits  COMPREHENSIVE METABOLIC PANEL - Abnormal; Notable for the following components:   Sodium 127 (*)    CO2 18 (*)    Glucose, Bld 122 (*)    BUN 35 (*)    Creatinine, Ser 1.95 (*)    Calcium 7.9 (*)    Total Protein 5.6 (*)    Albumin 2.5 (*)    GFR, Estimated 28 (*)    All other components within normal limits  PROTIME-INR - Abnormal; Notable for the following components:   Prothrombin Time 16.3 (*)    INR 1.3 (*)    All other components within normal limits  RESP PANEL BY RT-PCR (RSV, FLU A&B, COVID)  [id number]  CULTURE, BLOOD (ROUTINE X 2)  CULTURE, BLOOD (ROUTINE X 2)  LACTIC ACID, PLASMA  APTT  LACTIC ACID, PLASMA    EKG None  Radiology DG Chest Port 1 View  Result Date: 02/20/2023 CLINICAL DATA:  Sepsis? EXAM: PORTABLE CHEST 1 VIEW COMPARISON:  None Available. FINDINGS: Unremarkable cardiac silhouette. Normal pulmonary vasculature. No pneumothorax or pleural effusion. Alveolar opacity left base suggests a developing pneumonia versus volume loss. This can be followed up to confirm resolution as a precaution. IMPRESSION: Alveolar density left base suggest developing pneumonia versus volume loss. Electronically Signed   By: Sammie Bench M.D.   On: 02/20/2023 19:05    Procedures .Critical Care  Performed by: Fransico Meadow, PA-C Authorized by: Fransico Meadow, PA-C   Critical care provider statement:    Critical care time (minutes):  45   Critical care start time:  02/20/2023 7:00 PM   Critical care end time:  02/20/2023 9:31 PM   Critical care was necessary to treat or prevent imminent or life-threatening deterioration of the following conditions:  Sepsis, cardiac failure, circulatory failure and respiratory failure   Critical care was time spent personally by me on the following activities:  Development of treatment plan with patient or surrogate, discussions with consultants, evaluation of patient's response to treatment, examination of patient, ordering and  review of laboratory studies, ordering and review of radiographic studies, ordering and performing treatments and interventions, pulse oximetry, re-evaluation of patient's condition and review of old charts   Care discussed with: admitting provider       Medications Ordered in ED Medications  acetaminophen (TYLENOL) tablet 650 mg (650 mg Oral Given 02/20/23 1905)  lactated ringers infusion (has no administration in time range)  vancomycin (VANCOREADY) IVPB 2000 mg/400 mL (2,000 mg Intravenous New Bag/Given 02/20/23  1945)  vancomycin variable dose per unstable renal function (pharmacist dosing) (has no administration in time range)  norepinephrine (LEVOPHED) 4mg  in 224mL (0.016 mg/mL) premix infusion (has no administration in time range)  cefTRIAXone (ROCEPHIN) 2 g in sodium chloride 0.9 % 100 mL IVPB (0 g Intravenous Stopped 02/20/23 2000)  lactated ringers bolus 1,000 mL (0 mLs Intravenous Stopped 02/20/23 2000)    And  lactated ringers bolus 1,000 mL (0 mLs Intravenous Stopped 02/20/23 1924)    And  lactated ringers bolus 1,000 mL (0 mLs Intravenous Stopped 02/20/23 2109)    And  lactated ringers bolus 500 mL (0 mLs Intravenous Stopped 02/20/23 2047)    ED Course/ Medical Decision Making/ A&P                             Medical Decision Making Patient complains of a cough and congestion.  Patient was diagnosed with influenza on Saturday and started on antibiotics for an infection to her buttock.   Amount and/or Complexity of Data Reviewed External Data Reviewed: notes.    Details: Primary care notes reviewed patient was seen in her primary care doctor's office and had a low blood pressure.  Patient was sent to the emergency department after receiving a liter of fluids and continuing to have low blood pressure Labs: ordered. Decision-making details documented in ED Course.    Details: White blood cell count is 11.5.  Sodium is 127, BUN is 5 creatinine is 1.95 Radiology: ordered and  independent interpretation performed. Decision-making details documented in ED Course.    Details: Chest x-ray shows possible developing pneumonia. Ct abd and pelvis  shows gangrene/necrotizing fascitis.   ECG/medicine tests: ordered and independent interpretation performed. Discussion of management or test interpretation with external provider(s): Dr. Sherry Ruffing in to see and examine  Critical care paged to see pt for hypotension. Dr. Tacy Learn Critical care will see here.  PA Mickel Baas Gleason  saw and evaluated pt. Pt had delay getting to Ct due to hypotension Pt started on levofed drip,  blood pressures improved  Radiology called and advised gangrene/necrotizing fascitis to bottom.   Dr. Donne Hazel consulted and evaluated pt.  Pt will go to OR.   Risk OTC drugs. Prescription drug management. Decision regarding hospitalization. Risk Details: Emergency department course.  Patient has hypotension, cough and reported fever,  Sepsis orders placed,  Lactic acid normal,  Pt has an elevated wbc count of 11.5.  IV antibiotics started, patient given IV fluid bolus, chest x-ray shows alveolar density left base, possible pneumonia.            Final Clinical Impression(s) / ED Diagnoses Final diagnoses:  Hypotension, unspecified hypotension type  Necrotizing fasciitis [facility])    Rx / Ruso Orders ED Discharge Orders     None         Sidney Ace 02/20/23 2312    Tegeler, Gwenyth Allegra, MD 02/20/23 2352

## 2023-02-20 NOTE — Consult Note (Signed)
Reason for Consult:perineal infectoin Referring Physician: Dr Lorrin Mais is an 67 y.o. female.  HPI: 58 yof with pmh obesity, tobacco use presents with dizziness and low bp.  She noted a buttock lesion that hurt. This has gotten worse over the last week.  She also had a cough several days ago and has had sob as well as n/v, not taking po.  She was noted to have a lactic acid of 1.7, cr of 1.95, wbc 11.5. she has been on levophed here after getting some fliuds.  She has undergone a ct scan that shows soft tissue air and induration in the posterior right perineum, right perirectal and gluteus.  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)    Colitis    ulcerative   Depression    Headache(784.0)    most severe for 2 months,cluster   Headaches, cluster    02 and medication   High cholesterol    Irritable bowel    Joint pain    Knee pain    Obesity    Osteoarthritis    Pneumonia YRS AGO   Primary snoring 06/17/2014   Psoriasis    Ulcerative colitis ([place])     Past Surgical History:  Procedure Laterality Date   BREAST BIOPSY Right 1990   BREAST EXCISIONAL BIOPSY Right 1994   JOINT REPLACEMENT     TOTAL HIP ARTHROPLASTY Left 06/08/2016   Procedure: LEFT TOTAL HIP ARTHROPLASTY ANTERIOR APPROACH;  Surgeon: Mcarthur Rossetti, MD;  Location: [facility]S;  Service: Orthopedics;  Laterality: Left;   TOTAL KNEE ARTHROPLASTY Left 11/07/2018   Procedure: LEFT TOTAL KNEE ARTHROPLASTY;  Surgeon: Mcarthur Rossetti, MD;  Location: [facility] ORS;  Service: Orthopedics;  Laterality: Left;    Family History  Problem Relation Age of Onset   Hypertension Mother    High Cholesterol Mother    Stroke Mother    Diabetes Father    Thyroid disease Father    Obesity Father    Diabetes Sister    Diabetes Brother    Heart disease Maternal Grandmother    Heart disease Maternal  Grandfather    Colon cancer Neg Hx     Social History:  reports that she has been smoking cigarettes. She has a 21.00 pack-year smoking history. She has never used smokeless tobacco. She reports that she does not drink alcohol and does not use drugs.  Allergies:  Allergies  Allergen Reactions   Sulfa Antibiotics Itching   Sulfonamide Derivatives Itching    Medications: I have reviewed the patient's current medications.  Results for orders placed or performed during the hospital encounter of 02/20/23 (from the past 48 hour(s))  CBC     Status: Abnormal   Collection Time: 02/20/23  6:06 PM  Result Value Ref Range   WBC 11.5 (H) 4.0 - 10.5 K/uL   RBC 3.84 (L) 3.87 - 5.11 MIL/uL   Hemoglobin 11.2 (L) 12.0 - 15.0 g/dL   HCT 33.0 (L) 36.0 - 46.0 %   MCV 85.9 80.0 - 100.0 fL   MCH 29.2 26.0 - 34.0 pg   MCHC 33.9 30.0 - 36.0 g/dL   RDW 13.8 11.5 - 15.5 %   Platelets 147 (L) 150 - 400 K/uL   nRBC 0.0 0.0 - 0.2 %    Comment: Performed at [facility], [place]  [address] [place], [place] [address]  Comprehensive metabolic panel     Status: Abnormal   Collection Time: 02/20/23  6:06 PM  Result Value Ref Range   Sodium 127 (L) 135 - 145 mmol/L   Potassium 3.6 3.5 - 5.1 mmol/L   Chloride 98 98 - 111 mmol/L   CO2 18 (L) 22 - 32 mmol/L   Glucose, Bld 122 (H) 70 - 99 mg/dL    Comment: Glucose reference range applies only to samples taken after fasting for at least 8 hours.   BUN 35 (H) 8 - 23 mg/dL   Creatinine, Ser 1.95 (H) 0.44 - 1.00 mg/dL   Calcium 7.9 (L) 8.9 - 10.3 mg/dL   Total Protein 5.6 (L) 6.5 - 8.1 g/dL   Albumin 2.5 (L) 3.5 - 5.0 g/dL   AST 20 15 - 41 U/L   ALT 13 0 - 44 U/L   Alkaline Phosphatase 87 38 - 126 U/L   Total Bilirubin 1.0 0.3 - 1.2 mg/dL   GFR, Estimated 28 (L) >60 mL/min    Comment: (NOTE) Calculated using the CKD-EPI Creatinine Equation (2021)    Anion gap 11 5 - 15    Comment: Performed at [facility], [place] [address] [place], [place]  [address]  Resp panel by RT-PCR (RSV, Flu A&B, Covid) Anterior Nasal Swab     Status: None   Collection Time: 02/20/23  6:23 PM   Specimen: Anterior Nasal Swab  Result Value Ref Range   SARS Coronavirus 2 by RT PCR NEGATIVE NEGATIVE   Influenza A by PCR NEGATIVE NEGATIVE   Influenza B by PCR NEGATIVE NEGATIVE    Comment: (NOTE) The Xpert Xpress SARS-CoV-2/FLU/RSV plus assay is intended as an aid in the diagnosis of influenza from Nasopharyngeal swab specimens and should not be used as a sole basis for treatment. Nasal washings and aspirates are unacceptable for Xpert Xpress SARS-CoV-2/FLU/RSV testing.  Fact Sheet for Patients: EntrepreneurPulse.com.au  Fact Sheet for Healthcare Providers: IncredibleEmployment.be  This test is not yet approved or cleared by the [place] FDA and has been authorized for detection and/or diagnosis of SARS-CoV-2 by FDA under an Emergency Use Authorization (EUA). This EUA will remain in effect (meaning this test can be used) for the duration of the COVID-19 declaration under Section 564(b)(1) of the Act, 21 U.S.C. section 360bbb-3(b)(1), unless the authorization is terminated or revoked.     Resp Syncytial Virus by PCR NEGATIVE NEGATIVE    Comment: (NOTE) Fact Sheet for Patients: EntrepreneurPulse.com.au  Fact Sheet for Healthcare Providers: IncredibleEmployment.be  This test is not yet approved or cleared by the [place] FDA and has been authorized for detection and/or diagnosis of SARS-CoV-2 by FDA under an Emergency Use Authorization (EUA). This EUA will remain in effect (meaning this test can be used) for the duration of the COVID-19 declaration under Section 564(b)(1) of the Act, 21 U.S.C. section 360bbb-3(b)(1), unless the authorization is terminated or revoked.  Performed at [facility], [place] [address], [place], [place] [address]   Lactic acid, plasma      Status: None   Collection Time: 02/20/23  6:23 PM  Result Value Ref Range   Lactic Acid, Venous 1.7 0.5 - 1.9 mmol/L    Comment: Performed at [facility] [address], [place], [place] [address]  Protime-INR     Status: Abnormal   Collection Time: 02/20/23  6:23 PM  Result Value Ref Range   Prothrombin Time 16.3 (H) 11.4 -  15.2 seconds   INR 1.3 (H) 0.8 - 1.2    Comment: (NOTE) INR goal varies based on device and disease states. Performed at [facility], [place] [address], [place], [place] [address]   APTT     Status: None   Collection Time: 02/20/23  6:23 PM  Result Value Ref Range   aPTT 31 24 - 36 seconds    Comment: Performed at [facility] [address] [place], [place] [address]    CT ABDOMEN PELVIS WO CONTRAST  Result Date: 02/20/2023 CLINICAL DATA:  Dizziness and hypotension. EXAM: CT ABDOMEN AND PELVIS WITHOUT CONTRAST TECHNIQUE: Multidetector CT imaging of the abdomen and pelvis was performed following the standard protocol without IV contrast. RADIATION DOSE REDUCTION: This exam was performed according to the departmental dose-optimization program which includes automated exposure control, adjustment of the mA and/or kV according to patient size and/or use of iterative reconstruction technique. COMPARISON:  None Available. FINDINGS: Lower chest: Moderate severity atelectasis is seen within the bilateral lung bases. A small pericardial effusion is noted. Hepatobiliary: No focal liver abnormality is seen. No gallstones, gallbladder wall thickening, or biliary dilatation. Pancreas: Unremarkable. No pancreatic ductal dilatation or surrounding inflammatory changes. Spleen: Normal in size without focal abnormality. Adrenals/Urinary Tract: Adrenal glands are unremarkable. Kidneys are normal in size, without renal calculi, focal lesion, or hydronephrosis. There is mild, bilateral, nonspecific perinephric inflammatory fat stranding. The urinary bladder is limited  in evaluation secondary to overlying streak artifact. Stomach/Bowel: There is a small hiatal hernia. The appendix appears normal. No evidence of bowel wall thickening, distention, or inflammatory changes. Vascular/Lymphatic: Aortic atherosclerosis. No enlarged abdominal or pelvic lymph nodes. Reproductive: The uterus is unremarkable. A 3.6 cm x 2.2 cm mildly hyperdense area is seen within the posterior aspect of the left adnexa (approximately 39.27 Hounsfield units). Other: No abdominal wall hernia. No abdominopelvic ascites. Marked severity presacral inflammatory fat stranding is seen. Musculoskeletal: A marked amount of soft tissue air is seen within the posterior aspect of the perineum, right greater than left. This extends along the right perirectal and right posterior pelvic region (axial CT image 77, CT series 3) as well as the posteromedial aspects of the gluteal regions, right greater than left. A total left hip replacement is seen with associated streak artifact and subsequently limited evaluation of the adjacent osseous and soft tissue structures. Multilevel degenerative changes seen throughout the lumbar spine. IMPRESSION: 1. Findings, as described above, consistent with marked severity Fournier's gangrene. 2. Marked severity presacral inflammatory fat stranding, which may be infectious in etiology. 3. Small hiatal hernia. 4. Total left hip replacement. 5. Aortic atherosclerosis. 6. Small pericardial effusion. 7. Moderate severity bilateral lower lobe atelectasis. 8. Small, mildly hyperdense area within the posterior aspect of the left adnexa which may represent a hemorrhagic ovarian cyst. Correlation with nonemergent pelvic ultrasound is recommended. Aortic Atherosclerosis (ICD10-I70.0). Electronically Signed   By: Virgina Norfolk M.D.   On: 02/20/2023 22:37   DG Chest Port 1 View  Result Date: 02/20/2023 CLINICAL DATA:  Sepsis? EXAM: PORTABLE CHEST 1 VIEW COMPARISON:  None Available. FINDINGS:  Unremarkable cardiac silhouette. Normal pulmonary vasculature. No pneumothorax or pleural effusion. Alveolar opacity left base suggests a developing pneumonia versus volume loss. This can be followed up to confirm resolution as a precaution. IMPRESSION: Alveolar density left base suggest developing pneumonia versus volume loss. Electronically Signed   By: Sammie Bench M.D.   On: 02/20/2023 19:05    Review of Systems  Constitutional:  Positive for appetite change, fatigue and fever.  Respiratory:  Positive for shortness of breath.   Cardiovascular:  Negative for chest pain.  Gastrointestinal:  Negative for abdominal pain.  All other systems reviewed and are negative.  Blood pressure 108/62, pulse 87, temperature 98.7 F (37.1 C), temperature source Oral, resp. rate (!) 27, SpO2 95 %. Physical Exam Constitutional:      Appearance: She is obese. She is ill-appearing.  Eyes:     General: No scleral icterus. Cardiovascular:     Rate and Rhythm: Regular rhythm. Tachycardia present.  Pulmonary:     Effort: Pulmonary effort is normal.  Abdominal:     Palpations: Abdomen is soft.     Tenderness: There is no abdominal tenderness.  Genitourinary:      Comments: Indurated erythematous tender area right gluteus extending to perineum right greater than left  Musculoskeletal:     Cervical back: Neck supple.     Right lower leg: No edema.     Left lower leg: No edema.  Skin:    General: Skin is warm.     Capillary Refill: Capillary refill takes less than 2 seconds.  Neurological:     General: No focal deficit present.  Psychiatric:        Behavior: Behavior normal.     Assessment/Plan: NSTI -needs to go to OR urgently for debridement -admission to icu postop -plan for at least a second operation  I reviewed ED provider notes, last 24 h vitals and pain scores, last 24 h labs and trends, and last 24 h imaging results. I reviewed ct scan and noted the nsti. I discussed case with er  provider also  This care required high  level of medical decision making.    Rolm Bookbinder 02/20/2023, 10:54 PM

## 2023-02-20 NOTE — Progress Notes (Addendum)
Pharmacy Antibiotic Note  Wendy Crawford is a 67 y.o. female for which pharmacy has been consulted for vancomycin dosing for cellulitis.  Patient with a history of arthritis, back pain, UC, depression. Patient presenting from UC with hypotension. Recently placed on antibiotics for wound on buttox.  SCr 1.95 - AKI WBC 11.5; LA 1.7; T 100.6; HR 106; RR 15  Plan: Clindamycin once ordered by ED provider for concern for necrotizing fascitis  Rocephin broadened to Cefepime  --Will start cefepime 2g q12hr  Vancomycin 2000 mg once, subsequent dosing as indicated per random vancomycin level until renal function stable and/or improved, at which time scheduled dosing can be considered Trend WBC, Fever, Renal function F/u cultures, clinical course, WBC De-escalate when able     Temp (24hrs), Avg:100.3 F (37.9 C), Min:100 F (37.8 C), Max:100.6 F (38.1 C)  No results for input(s): "WBC", "CREATININE", "LATICACIDVEN", "VANCOTROUGH", "VANCOPEAK", "VANCORANDOM", "GENTTROUGH", "GENTPEAK", "GENTRANDOM", "TOBRATROUGH", "TOBRAPEAK", "TOBRARND", "AMIKACINPEAK", "AMIKACINTROU", "AMIKACIN" in the last 168 hours.  CrCl cannot be calculated (Patient's most recent lab result is older than the maximum 21 days allowed.).    Allergies  Allergen Reactions   Sulfa Antibiotics Itching   Sulfonamide Derivatives Itching   Microbiology results: Pending  Thank you for allowing pharmacy to be a part of this patient's care.  Lorelei Pont, PharmD, BCPS 02/20/2023 6:38 PM ED Clinical Pharmacist -  [phone]

## 2023-02-21 ENCOUNTER — Inpatient Hospital Stay (HOSPITAL_COMMUNITY): Payer: Medicare Other | Admitting: Registered Nurse

## 2023-02-21 ENCOUNTER — Inpatient Hospital Stay (HOSPITAL_COMMUNITY): Payer: Medicare Other

## 2023-02-21 ENCOUNTER — Encounter (HOSPITAL_COMMUNITY): Payer: Self-pay | Admitting: General Surgery

## 2023-02-21 ENCOUNTER — Other Ambulatory Visit: Payer: Self-pay

## 2023-02-21 DIAGNOSIS — R6521 Severe sepsis with septic shock: Secondary | ICD-10-CM | POA: Diagnosis not present

## 2023-02-21 DIAGNOSIS — F17210 Nicotine dependence, cigarettes, uncomplicated: Secondary | ICD-10-CM

## 2023-02-21 DIAGNOSIS — J449 Chronic obstructive pulmonary disease, unspecified: Secondary | ICD-10-CM | POA: Diagnosis not present

## 2023-02-21 DIAGNOSIS — M726 Necrotizing fasciitis: Secondary | ICD-10-CM

## 2023-02-21 DIAGNOSIS — D649 Anemia, unspecified: Secondary | ICD-10-CM | POA: Diagnosis not present

## 2023-02-21 DIAGNOSIS — A419 Sepsis, unspecified organism: Secondary | ICD-10-CM | POA: Diagnosis not present

## 2023-02-21 LAB — POCT I-STAT 7, (LYTES, BLD GAS, ICA,H+H)
Acid-base deficit: 8 mmol/L — ABNORMAL HIGH (ref 0.0–2.0)
Acid-base deficit: 6 mmol/L — ABNORMAL HIGH (ref 0.0–2.0)
Acid-base deficit: 6 mmol/L — ABNORMAL HIGH (ref 0.0–2.0)
Bicarbonate: 20.5 mmol/L (ref 20.0–28.0)
Bicarbonate: 19.9 mmol/L — ABNORMAL LOW (ref 20.0–28.0)
Bicarbonate: 20.9 mmol/L (ref 20.0–28.0)
Calcium, Ion: 1.16 mmol/L (ref 1.15–1.40)
Calcium, Ion: 1.04 mmol/L — ABNORMAL LOW (ref 1.15–1.40)
Calcium, Ion: 0.98 mmol/L — ABNORMAL LOW (ref 1.15–1.40)
HCT: 31 % — ABNORMAL LOW (ref 36.0–46.0)
HCT: 31 % — ABNORMAL LOW (ref 36.0–46.0)
HCT: 33 % — ABNORMAL LOW (ref 36.0–46.0)
Hemoglobin: 10.5 g/dL — ABNORMAL LOW (ref 12.0–15.0)
Hemoglobin: 10.5 g/dL — ABNORMAL LOW (ref 12.0–15.0)
Hemoglobin: 11.2 g/dL — ABNORMAL LOW (ref 12.0–15.0)
O2 Saturation: 94 %
O2 Saturation: 98 %
O2 Saturation: 99 %
Patient temperature: 96.5
Patient temperature: 98
Patient temperature: 98
Potassium: 3.5 mmol/L (ref 3.5–5.1)
Potassium: 3.9 mmol/L (ref 3.5–5.1)
Potassium: 4.1 mmol/L (ref 3.5–5.1)
Sodium: 132 mmol/L — ABNORMAL LOW (ref 135–145)
Sodium: 132 mmol/L — ABNORMAL LOW (ref 135–145)
Sodium: 132 mmol/L — ABNORMAL LOW (ref 135–145)
TCO2: 22 mmol/L (ref 22–32)
TCO2: 21 mmol/L — ABNORMAL LOW (ref 22–32)
TCO2: 22 mmol/L (ref 22–32)
pCO2 arterial: 53 mmHg — ABNORMAL HIGH (ref 32–48)
pCO2 arterial: 41.1 mmHg (ref 32–48)
pCO2 arterial: 46.9 mmHg (ref 32–48)
pH, Arterial: 7.188 — CL (ref 7.35–7.45)
pH, Arterial: 7.292 — ABNORMAL LOW (ref 7.35–7.45)
pH, Arterial: 7.255 — ABNORMAL LOW (ref 7.35–7.45)
pO2, Arterial: 83 mmHg (ref 83–108)
pO2, Arterial: 107 mmHg (ref 83–108)
pO2, Arterial: 133 mmHg — ABNORMAL HIGH (ref 83–108)

## 2023-02-21 LAB — HIV ANTIBODY (ROUTINE TESTING W REFLEX): HIV Screen 4th Generation wRfx: NONREACTIVE

## 2023-02-21 LAB — BLOOD CULTURE ID PANEL (REFLEXED) - BCID2

## 2023-02-21 LAB — CBC
HCT: 31.6 % — ABNORMAL LOW (ref 36.0–46.0)
Hemoglobin: 10.5 g/dL — ABNORMAL LOW (ref 12.0–15.0)
MCH: 29.3 pg (ref 26.0–34.0)
MCHC: 33.2 g/dL (ref 30.0–36.0)
MCV: 88.3 fL (ref 80.0–100.0)
Platelets: 150 10*3/uL (ref 150–400)
RBC: 3.58 MIL/uL — ABNORMAL LOW (ref 3.87–5.11)
RDW: 14.2 % (ref 11.5–15.5)
WBC: 15.9 10*3/uL — ABNORMAL HIGH (ref 4.0–10.5)
nRBC: 0 % (ref 0.0–0.2)

## 2023-02-21 LAB — TYPE AND SCREEN
ABO/RH(D): O POS
Antibody Screen: NEGATIVE

## 2023-02-21 LAB — BASIC METABOLIC PANEL
Anion gap: 12 (ref 5–15)
BUN: 36 mg/dL — ABNORMAL HIGH (ref 8–23)
CO2: 21 mmol/L — ABNORMAL LOW (ref 22–32)
Calcium: 7.8 mg/dL — ABNORMAL LOW (ref 8.9–10.3)
Chloride: 98 mmol/L (ref 98–111)
Creatinine, Ser: 1.52 mg/dL — ABNORMAL HIGH (ref 0.44–1.00)
GFR, Estimated: 38 mL/min — ABNORMAL LOW (ref >60)
Glucose, Bld: 169 mg/dL — ABNORMAL HIGH (ref 70–99)
Potassium: 3.7 mmol/L (ref 3.5–5.1)
Sodium: 131 mmol/L — ABNORMAL LOW (ref 135–145)

## 2023-02-21 LAB — STREP PNEUMONIAE URINARY ANTIGEN: Strep Pneumo Urinary Antigen: NEGATIVE

## 2023-02-21 LAB — GLUCOSE, CAPILLARY
Glucose-Capillary: 157 mg/dL — ABNORMAL HIGH (ref 70–99)
Glucose-Capillary: 185 mg/dL — ABNORMAL HIGH (ref 70–99)
Glucose-Capillary: 232 mg/dL — ABNORMAL HIGH (ref 70–99)
Glucose-Capillary: 133 mg/dL — ABNORMAL HIGH (ref 70–99)
Glucose-Capillary: 137 mg/dL — ABNORMAL HIGH (ref 70–99)
Glucose-Capillary: 166 mg/dL — ABNORMAL HIGH (ref 70–99)

## 2023-02-21 LAB — LACTIC ACID, PLASMA
Lactic Acid, Venous: 0.8 mmol/L (ref 0.5–1.9)
Lactic Acid, Venous: 1.1 mmol/L (ref 0.5–1.9)

## 2023-02-21 LAB — MAGNESIUM
Magnesium: 1.7 mg/dL (ref 1.7–2.4)
Magnesium: 2.6 mg/dL — ABNORMAL HIGH (ref 1.7–2.4)

## 2023-02-21 LAB — PHOSPHORUS
Phosphorus: 3.6 mg/dL (ref 2.5–4.6)
Phosphorus: 4.1 mg/dL (ref 2.5–4.6)

## 2023-02-21 LAB — MRSA NEXT GEN BY PCR, NASAL: MRSA by PCR Next Gen: NOT DETECTED

## 2023-02-21 LAB — SODIUM: Sodium: 129 mmol/L — ABNORMAL LOW (ref 135–145)

## 2023-02-21 LAB — PROCALCITONIN: Procalcitonin: 7.42 ng/mL

## 2023-02-21 MED ORDER — ORAL CARE MOUTH RINSE: 15.0000 mL | OROMUCOSAL | Status: DC | PRN

## 2023-02-21 MED ORDER — DEXAMETHASONE SODIUM PHOSPHATE 10 MG/ML IJ SOLN
INTRAMUSCULAR | Status: DC | PRN
  Administered 2023-02-21: 4 mg via INTRAVENOUS

## 2023-02-21 MED ORDER — SODIUM CHLORIDE 0.9% FLUSH
10.0000 mL | Freq: Two times a day (BID) | INTRAVENOUS | Status: DC
  Administered 2023-02-21 – 2023-02-25 (×11): 10 mL
  Administered 2023-02-26: 20 mL
  Administered 2023-02-26: 10 mL
  Administered 2023-02-27 – 2023-02-28 (×4): 20 mL
  Administered 2023-03-01: 10 mL

## 2023-02-21 MED ORDER — MAGNESIUM SULFATE 2 GM/50ML IV SOLN
2.0000 g | Freq: Once | INTRAVENOUS | Status: AC
  Administered 2023-02-21: 2 g via INTRAVENOUS
  Filled 2023-02-21: qty 50

## 2023-02-21 MED ORDER — SODIUM CHLORIDE 0.9% FLUSH: 10.0000 mL | INTRAVENOUS | Status: DC | PRN

## 2023-02-21 MED ORDER — FENTANYL BOLUS VIA INFUSION
25.0000 ug | INTRAVENOUS | Status: DC | PRN
  Administered 2023-02-21: 50 ug via INTRAVENOUS
  Administered 2023-02-23: 100 ug via INTRAVENOUS
  Administered 2023-02-23 (×2): 50 ug via INTRAVENOUS
  Administered 2023-02-23: 100 ug via INTRAVENOUS
  Administered 2023-02-25: 50 ug via INTRAVENOUS
  Administered 2023-02-25: 100 ug via INTRAVENOUS
  Administered 2023-02-25: 25 ug via INTRAVENOUS
  Administered 2023-02-25 (×2): 50 ug via INTRAVENOUS
  Administered 2023-02-25: 25 ug via INTRAVENOUS
  Administered 2023-02-26 (×4): 100 ug via INTRAVENOUS
  Administered 2023-02-26: 50 ug via INTRAVENOUS
  Administered 2023-02-26 (×2): 100 ug via INTRAVENOUS
  Administered 2023-02-26: 50 ug via INTRAVENOUS
  Administered 2023-02-27 – 2023-02-28 (×7): 100 ug via INTRAVENOUS
  Administered 2023-02-28: 50 ug via INTRAVENOUS
  Administered 2023-02-28 – 2023-03-01 (×12): 100 ug via INTRAVENOUS

## 2023-02-21 MED ORDER — SODIUM BICARBONATE 8.4 % IV SOLN
INTRAVENOUS | Status: DC
  Filled 2023-02-21: qty 1000

## 2023-02-21 MED ORDER — PROSOURCE TF20 ENFIT COMPATIBL EN LIQD: 60.0000 mL | Freq: Every day | ENTERAL | Status: DC

## 2023-02-21 MED ORDER — ORAL CARE MOUTH RINSE
15.0000 mL | OROMUCOSAL | Status: DC
  Administered 2023-02-21 – 2023-03-01 (×102): 15 mL via OROMUCOSAL

## 2023-02-21 MED ORDER — VITAL AF 1.2 CAL PO LIQD
1000.0000 mL | ORAL | Status: DC
  Administered 2023-02-21 – 2023-03-01 (×9): 1000 mL

## 2023-02-21 MED ORDER — POTASSIUM CHLORIDE 20 MEQ PO PACK
40.0000 meq | PACK | Freq: Once | ORAL | Status: AC
  Administered 2023-02-21: 40 meq
  Filled 2023-02-21: qty 2

## 2023-02-21 MED ORDER — CHLORHEXIDINE GLUCONATE CLOTH 2 % EX PADS: 6.0000 | MEDICATED_PAD | Freq: Every day | CUTANEOUS | Status: DC

## 2023-02-21 MED ORDER — LINEZOLID 600 MG/300ML IV SOLN
600.0000 mg | Freq: Two times a day (BID) | INTRAVENOUS | Status: DC
  Administered 2023-02-21 – 2023-02-23 (×6): 600 mg via INTRAVENOUS
  Filled 2023-02-21 (×9): qty 300

## 2023-02-21 MED ORDER — SODIUM CHLORIDE (PF) 0.9 % IJ SOLN
INTRAMUSCULAR | Status: AC
  Filled 2023-02-21: qty 10

## 2023-02-21 MED ORDER — MIDAZOLAM HCL 2 MG/2ML IJ SOLN
INTRAMUSCULAR | Status: DC | PRN
  Administered 2023-02-21: 2 mg via INTRAVENOUS

## 2023-02-21 MED ORDER — POLYETHYLENE GLYCOL 3350 17 G PO PACK: 17.0000 g | PACK | Freq: Every day | ORAL | Status: DC | PRN

## 2023-02-21 MED ORDER — SODIUM CHLORIDE 0.9% IV SOLUTION: Freq: Once | INTRAVENOUS | Status: DC

## 2023-02-21 MED ORDER — CLINDAMYCIN PHOSPHATE 600 MG/50ML IV SOLN
600.0000 mg | Freq: Three times a day (TID) | INTRAVENOUS | Status: DC
  Administered 2023-02-21: 600 mg via INTRAVENOUS
  Filled 2023-02-21 (×2): qty 50

## 2023-02-21 MED ORDER — DOCUSATE SODIUM 50 MG/5ML PO LIQD: 100.0000 mg | Freq: Two times a day (BID) | ORAL | Status: DC | PRN

## 2023-02-21 MED ORDER — PANTOPRAZOLE SODIUM 40 MG IV SOLR
40.0000 mg | INTRAVENOUS | Status: DC
  Administered 2023-02-21 – 2023-02-28 (×8): 40 mg via INTRAVENOUS
  Filled 2023-02-21 (×8): qty 10

## 2023-02-21 MED ORDER — PIPERACILLIN-TAZOBACTAM 3.375 G IVPB
3.3750 g | Freq: Three times a day (TID) | INTRAVENOUS | Status: DC
  Administered 2023-02-21 – 2023-03-01 (×24): 3.375 g via INTRAVENOUS
  Filled 2023-02-21 (×25): qty 50

## 2023-02-21 MED ORDER — PROPOFOL 10 MG/ML IV BOLUS
INTRAVENOUS | Status: DC | PRN
  Administered 2023-02-21: 50 mg via INTRAVENOUS

## 2023-02-21 MED ORDER — ACETAMINOPHEN 325 MG PO TABS
650.0000 mg | ORAL_TABLET | Freq: Four times a day (QID) | ORAL | Status: DC | PRN
  Administered 2023-02-27: 650 mg
  Filled 2023-02-21: qty 2

## 2023-02-21 MED ORDER — SODIUM CHLORIDE (PF) 0.9 % IJ SOLN
INTRAMUSCULAR | Status: AC
  Filled 2023-02-21: qty 20

## 2023-02-21 MED ORDER — 0.9 % SODIUM CHLORIDE (POUR BTL) OPTIME
TOPICAL | Status: DC | PRN
  Administered 2023-02-21: 1000 mL

## 2023-02-21 MED ORDER — CHLORHEXIDINE GLUCONATE CLOTH 2 % EX PADS
6.0000 | MEDICATED_PAD | Freq: Every day | CUTANEOUS | Status: DC
  Administered 2023-02-21: 6 via TOPICAL

## 2023-02-21 MED ORDER — FENTANYL 2500MCG IN NS 250ML (10MCG/ML) PREMIX INFUSION
0.0000 ug/h | INTRAVENOUS | Status: DC
  Administered 2023-02-21: 25 ug/h via INTRAVENOUS
  Administered 2023-02-22 – 2023-02-23 (×2): 100 ug/h via INTRAVENOUS
  Administered 2023-02-24: 125 ug/h via INTRAVENOUS
  Administered 2023-02-24: 175 ug/h via INTRAVENOUS
  Administered 2023-02-25: 125 ug/h via INTRAVENOUS
  Administered 2023-02-26 – 2023-02-27 (×3): 250 ug/h via INTRAVENOUS
  Administered 2023-02-27: 150 ug/h via INTRAVENOUS
  Administered 2023-02-28: 200 ug/h via INTRAVENOUS
  Administered 2023-02-28: 175 ug/h via INTRAVENOUS
  Administered 2023-02-28: 300 ug/h via INTRAVENOUS
  Administered 2023-03-01: 400 ug/h via INTRAVENOUS
  Administered 2023-03-01: 300 ug/h via INTRAVENOUS
  Filled 2023-02-21 (×16): qty 250

## 2023-02-21 MED ORDER — LACTATED RINGERS IV BOLUS
500.0000 mL | Freq: Once | INTRAVENOUS | Status: AC
  Administered 2023-02-21: 500 mL via INTRAVENOUS

## 2023-02-21 MED ORDER — PROPOFOL 500 MG/50ML IV EMUL
INTRAVENOUS | Status: DC | PRN
  Administered 2023-02-21: 40 ug/kg/min via INTRAVENOUS

## 2023-02-21 MED ORDER — VASOPRESSIN 20 UNIT/ML IV SOLN
INTRAVENOUS | Status: AC
  Filled 2023-02-21: qty 1

## 2023-02-21 MED ORDER — CHLORHEXIDINE GLUCONATE CLOTH 2 % EX PADS
6.0000 | MEDICATED_PAD | Freq: Every day | CUTANEOUS | Status: DC
  Administered 2023-02-21 – 2023-02-28 (×8): 6 via TOPICAL

## 2023-02-21 MED ORDER — ALBUTEROL SULFATE (2.5 MG/3ML) 0.083% IN NEBU
2.5000 mg | INHALATION_SOLUTION | RESPIRATORY_TRACT | Status: DC | PRN
  Administered 2023-02-21 – 2023-02-23 (×2): 2.5 mg via RESPIRATORY_TRACT
  Filled 2023-02-21 (×2): qty 3

## 2023-02-21 MED ORDER — PROPOFOL 1000 MG/100ML IV EMUL
5.0000 ug/kg/min | INTRAVENOUS | Status: DC
  Administered 2023-02-21: 35 ug/kg/min via INTRAVENOUS
  Administered 2023-02-21 (×3): 40 ug/kg/min via INTRAVENOUS
  Administered 2023-02-21: 35 ug/kg/min via INTRAVENOUS
  Administered 2023-02-21 – 2023-02-22 (×2): 40 ug/kg/min via INTRAVENOUS
  Administered 2023-02-22: 50 ug/kg/min via INTRAVENOUS
  Administered 2023-02-22 (×2): 20 ug/kg/min via INTRAVENOUS
  Administered 2023-02-22: 50 ug/kg/min via INTRAVENOUS
  Administered 2023-02-22: 40 ug/kg/min via INTRAVENOUS
  Administered 2023-02-22: 30 ug/kg/min via INTRAVENOUS
  Administered 2023-02-23: 40 ug/kg/min via INTRAVENOUS
  Administered 2023-02-23: 50 ug/kg/min via INTRAVENOUS
  Administered 2023-02-23: 30 ug/kg/min via INTRAVENOUS
  Administered 2023-02-23 (×2): 40 ug/kg/min via INTRAVENOUS
  Administered 2023-02-23: 80 ug/kg/min via INTRAVENOUS
  Administered 2023-02-23: 40 ug/kg/min via INTRAVENOUS
  Administered 2023-02-24 (×2): 50 ug/kg/min via INTRAVENOUS
  Administered 2023-02-24: 30 ug/kg/min via INTRAVENOUS
  Administered 2023-02-24: 50 ug/kg/min via INTRAVENOUS
  Administered 2023-02-24: 60 ug/kg/min via INTRAVENOUS
  Administered 2023-02-24: 50 ug/kg/min via INTRAVENOUS
  Administered 2023-02-24: 60 ug/kg/min via INTRAVENOUS
  Administered 2023-02-25: 20 ug/kg/min via INTRAVENOUS
  Administered 2023-02-25: 30 ug/kg/min via INTRAVENOUS
  Administered 2023-02-25: 15 ug/kg/min via INTRAVENOUS
  Administered 2023-02-26: 40 ug/kg/min via INTRAVENOUS
  Administered 2023-02-26 (×3): 20 ug/kg/min via INTRAVENOUS
  Administered 2023-02-27: 30 ug/kg/min via INTRAVENOUS
  Filled 2023-02-21 (×33): qty 100

## 2023-02-21 MED ORDER — PHENYLEPHRINE 80 MCG/ML (10ML) SYRINGE FOR IV PUSH (FOR BLOOD PRESSURE SUPPORT)
PREFILLED_SYRINGE | INTRAVENOUS | Status: DC | PRN
  Administered 2023-02-21 (×4): 80 ug via INTRAVENOUS

## 2023-02-21 MED ORDER — FENTANYL CITRATE (PF) 250 MCG/5ML IJ SOLN
INTRAMUSCULAR | Status: DC | PRN
  Administered 2023-02-21: 100 ug via INTRAVENOUS

## 2023-02-21 MED ORDER — VASOPRESSIN 20 UNIT/ML IV SOLN
INTRAVENOUS | Status: DC | PRN
  Administered 2023-02-21: 2 [IU] via INTRAVENOUS

## 2023-02-21 MED ORDER — ALBUMIN HUMAN 25 % IV SOLN
25.0000 g | Freq: Once | INTRAVENOUS | Status: AC
  Administered 2023-02-21: 25 g via INTRAVENOUS
  Filled 2023-02-21: qty 100

## 2023-02-21 MED ORDER — SODIUM CHLORIDE 0.9 % IV SOLN: INTRAVENOUS | Status: DC | PRN

## 2023-02-21 MED ORDER — LIDOCAINE 2% (20 MG/ML) 5 ML SYRINGE
INTRAMUSCULAR | Status: DC | PRN
  Administered 2023-02-21: 60 mg via INTRAVENOUS

## 2023-02-21 MED ORDER — ROCURONIUM BROMIDE 10 MG/ML (PF) SYRINGE
PREFILLED_SYRINGE | INTRAVENOUS | Status: DC | PRN
  Administered 2023-02-21: 50 mg via INTRAVENOUS

## 2023-02-21 NOTE — Anesthesia Procedure Notes (Signed)
Central Venous Catheter Insertion Performed by: Murvin Natal, MD, anesthesiologist Start/End3/21/2024 12:50 AM, 02/21/2023 1:10 AM Patient location: OR. Preanesthetic checklist: patient identified, IV checked, site marked, risks and benefits discussed, surgical consent, monitors and equipment checked, pre-op evaluation, timeout performed and anesthesia consent Position: Trendelenburg Patient sedated Hand hygiene performed , maximum sterile barriers used  and Seldinger technique used Catheter size: 8 Fr Total catheter length 16. Central line was placed.Double lumen Procedure performed using ultrasound guided technique. Ultrasound Notes:anatomy identified, needle tip was noted to be adjacent to the nerve/plexus identified, no ultrasound evidence of intravascular and/or intraneural injection and image(s) printed for medical record Attempts: 1 Following insertion, dressing applied, line sutured and Biopatch. Post procedure assessment: blood return through all ports, free fluid flow and no air  Patient tolerated the procedure well with no immediate complications.

## 2023-02-21 NOTE — Anesthesia Procedure Notes (Signed)
Arterial Line Insertion Start/End3/21/2024 12:35 AM, 02/21/2023 12:45 AM Performed by: Murvin Natal, MD, anesthesiologist  Patient location: OR. Preanesthetic checklist: patient identified, IV checked, site marked, risks and benefits discussed, surgical consent, monitors and equipment checked, pre-op evaluation, timeout performed and anesthesia consent Left, radial was placed Catheter size: 20 G Hand hygiene performed , maximum sterile barriers used  and Seldinger technique used  Attempts: 1 Procedure performed using ultrasound guided technique. Ultrasound Notes:anatomy identified, needle tip was noted to be adjacent to the nerve/plexus identified and no ultrasound evidence of intravascular and/or intraneural injection Following insertion, dressing applied and Biopatch. Post procedure assessment: normal and unchanged  Patient tolerated the procedure well with no immediate complications.

## 2023-02-21 NOTE — Anesthesia Procedure Notes (Signed)
Procedure Name: Intubation Date/Time: 02/21/2023 12:33 AM  Performed by: Trinna Post., CRNAPre-anesthesia Checklist: Patient identified, Emergency Drugs available, Suction available, Patient being monitored and Timeout performed Patient Re-evaluated:Patient Re-evaluated prior to induction Oxygen Delivery Method: Circle system utilized Preoxygenation: Pre-oxygenation with 100% oxygen Induction Type: IV induction Ventilation: Mask ventilation without difficulty Laryngoscope Size: Mac and 3 Grade View: Grade I Tube type: Oral Tube size: 7.0 mm Number of attempts: 1 Airway Equipment and Method: Stylet Placement Confirmation: ETT inserted through vocal cords under direct vision, positive ETCO2 and breath sounds checked- equal and bilateral Secured at: 22 cm Tube secured with: Tape Dental Injury: Teeth and Oropharynx as per pre-operative assessment

## 2023-02-21 NOTE — Anesthesia Postprocedure Evaluation (Signed)
Anesthesia Post Note  Patient: Wendy Crawford Kidney  Procedure(s) Performed: Debridement of Perineum     Patient location during evaluation: SICU Anesthesia Type: General Level of consciousness: sedated Pain management: pain level controlled Vital Signs Assessment: post-procedure vital signs reviewed and stable Respiratory status: patient remains intubated per anesthesia plan Cardiovascular status: stable Postop Assessment: no apparent nausea or vomiting Anesthetic complications: no   No notable events documented.  Last Vitals:  Vitals:   02/21/23 0615 02/21/23 0630  BP:    Pulse: 76 75  Resp: (!) 22 (!) 25  Temp:    SpO2: 93% 93%    Last Pain:  Vitals:   02/21/23 0400  TempSrc: Axillary  PainSc:                  Kerim Statzer P Nichalos Brenton

## 2023-02-21 NOTE — Progress Notes (Signed)
[facility] Progress Note  1 Day Post-Op  Subjective: CC-  Sedated on the vent. Weaning pressors. Daughter at bedside.  Objective: Vital signs in last 24 hours: Temp:  [96.5 F (35.8 C)-100.6 F (38.1 C)] 97.8 F (36.6 C) (03/21 1112) Pulse Rate:  [70-115] 70 (03/21 0800) Resp:  [15-31] 17 (03/21 0800) BP: (78-131)/(41-97) 99/53 (03/21 0744) SpO2:  [91 %-96 %] 96 % (03/21 0800) Arterial Line BP: (97-143)/(47-65) 107/62 (03/21 0800) FiO2 (%):  [70 %] 70 % (03/21 0744) Weight:  [101.6 kg] 101.6 kg (03/20 1604) Last BM Date :  (PTA)  Intake/Output from previous day: 03/20 0701 - 03/21 0700 In: 4712.6 [I.V.:1578.5; IV Piggyback:3134.2] Out: 325 [Urine:125; Blood:200] Intake/Output this shift: Total I/O In: 436.8 [I.V.:391.8; IV Piggyback:45] Out: 775 [Urine:775]  PE: Gen:  sedated on the vent Pulm:  mechanically ventilated GU: large bilateral buttock wounds clean without any purulent drainage, right side does track towards the labia with some induration at the very base of the right labia  Lab Results:  Recent Labs    02/20/23 2238 02/21/23 0306 02/21/23 0400 02/21/23 0839  WBC 13.5*  --  15.9*  --   HGB 10.4*   < > 10.5* 10.5*  HCT 32.5*   < > 31.6* 31.0*  PLT 151  --  150  --    < > = values in this interval not displayed.   BMET Recent Labs    02/20/23 1806 02/20/23 2238 02/21/23 0306 02/21/23 0400 02/21/23 0839 02/21/23 0846  NA 127*  --    < > 131* 132* 129*  K 3.6  --    < > 3.7 3.9  --   CL 98  --   --  98  --   --   CO2 18*  --   --  21*  --   --   GLUCOSE 122*  --   --  169*  --   --   BUN 35*  --   --  36*  --   --   CREATININE 1.95* 1.74*  --  1.52*  --   --   CALCIUM 7.9*  --   --  7.8*  --   --    < > = values in this interval not displayed.   PT/INR Recent Labs    02/20/23 1823  LABPROT 16.3*  INR 1.3*   CMP     Component Value Date/Time   NA 129 (L) 02/21/2023 0846   NA 141 01/21/2023 1008   K 3.9 02/21/2023 0839    CL 98 02/21/2023 0400   CO2 21 (L) 02/21/2023 0400   GLUCOSE 169 (H) 02/21/2023 0400   BUN 36 (H) 02/21/2023 0400   BUN 14 01/21/2023 1008   CREATININE 1.52 (H) 02/21/2023 0400   CREATININE 0.87 04/11/2021 0838   CALCIUM 7.8 (L) 02/21/2023 0400   PROT 5.6 (L) 02/20/2023 1806   PROT 7.1 01/21/2023 1008   ALBUMIN 2.5 (L) 02/20/2023 1806   ALBUMIN 4.8 01/21/2023 1008   AST 20 02/20/2023 1806   ALT 13 02/20/2023 1806   ALKPHOS 87 02/20/2023 1806   BILITOT 1.0 02/20/2023 1806   BILITOT 0.3 01/21/2023 1008   GFRNONAA 38 (L) 02/21/2023 0400   GFRNONAA 70 04/11/2021 0838   GFRAA 82 04/11/2021 0838   Lipase  No results found for: "LIPASE"     Studies/Results: DG CHEST PORT 1 VIEW  Result Date: 02/21/2023 CLINICAL DATA:  200808 with hypoxia and ventilator dependent respiratory  failure. EXAM: PORTABLE CHEST 1 VIEW COMPARISON:  Portable chest yesterday at 6:45 p.m. FINDINGS: 3:25 a.m. The patient is now intubated. The tip of the ETT abuts the carina and needs to be withdrawn 4 cm to a mid tracheal position. NGT has also been inserted and passes well into the stomach with the tip and side hole not in the film. A right IJ line has also been placed in terminates in the SVC just below the azygous confluence. There is increased opacity in the left lung base and development of a small left pleural effusion. This could be due to worsening of atelectasis, pneumonia or aspiration. The remaining lung fields are clear. There is stable cardiomegaly, mild central vascular fullness without overt edema. There is calcification in the proximal aorta and stable mediastinum with aortic tortuosity. No new osseous abnormality is seen. Thoracic spondylosis and degenerative disc disease. IMPRESSION: 1. The ETT abuts the carina and needs to be withdrawn 4 cm to a mid tracheal position. 2. Increased left lung base opacity and development of a small left pleural effusion. This could be due to worsening of atelectasis,  pneumonia or aspiration. 3. Stable cardiomegaly and mild central vascular fullness. 4. These results will be called to the ordering clinician or representative by the Radiologist Assistant, and communication documented in the PACS or [employer]. Electronically Signed   By: Telford Nab M.D.   On: 02/21/2023 06:28   CT ABDOMEN PELVIS WO CONTRAST  Result Date: 02/20/2023 CLINICAL DATA:  Dizziness and hypotension. EXAM: CT ABDOMEN AND PELVIS WITHOUT CONTRAST TECHNIQUE: Multidetector CT imaging of the abdomen and pelvis was performed following the standard protocol without IV contrast. RADIATION DOSE REDUCTION: This exam was performed according to the departmental dose-optimization program which includes automated exposure control, adjustment of the mA and/or kV according to patient size and/or use of iterative reconstruction technique. COMPARISON:  None Available. FINDINGS: Lower chest: Moderate severity atelectasis is seen within the bilateral lung bases. A small pericardial effusion is noted. Hepatobiliary: No focal liver abnormality is seen. No gallstones, gallbladder wall thickening, or biliary dilatation. Pancreas: Unremarkable. No pancreatic ductal dilatation or surrounding inflammatory changes. Spleen: Normal in size without focal abnormality. Adrenals/Urinary Tract: Adrenal glands are unremarkable. Kidneys are normal in size, without renal calculi, focal lesion, or hydronephrosis. There is mild, bilateral, nonspecific perinephric inflammatory fat stranding. The urinary bladder is limited in evaluation secondary to overlying streak artifact. Stomach/Bowel: There is a small hiatal hernia. The appendix appears normal. No evidence of bowel wall thickening, distention, or inflammatory changes. Vascular/Lymphatic: Aortic atherosclerosis. No enlarged abdominal or pelvic lymph nodes. Reproductive: The uterus is unremarkable. A 3.6 cm x 2.2 cm mildly hyperdense area is seen within the posterior aspect of the  left adnexa (approximately 39.27 Hounsfield units). Other: No abdominal wall hernia. No abdominopelvic ascites. Marked severity presacral inflammatory fat stranding is seen. Musculoskeletal: A marked amount of soft tissue air is seen within the posterior aspect of the perineum, right greater than left. This extends along the right perirectal and right posterior pelvic region (axial CT image 77, CT series 3) as well as the posteromedial aspects of the gluteal regions, right greater than left. A total left hip replacement is seen with associated streak artifact and subsequently limited evaluation of the adjacent osseous and soft tissue structures. Multilevel degenerative changes seen throughout the lumbar spine. IMPRESSION: 1. Findings, as described above, consistent with marked severity Fournier's gangrene. 2. Marked severity presacral inflammatory fat stranding, which may be infectious in etiology. 3. Small  hiatal hernia. 4. Total left hip replacement. 5. Aortic atherosclerosis. 6. Small pericardial effusion. 7. Moderate severity bilateral lower lobe atelectasis. 8. Small, mildly hyperdense area within the posterior aspect of the left adnexa which may represent a hemorrhagic ovarian cyst. Correlation with nonemergent pelvic ultrasound is recommended. Aortic Atherosclerosis (ICD10-I70.0). Electronically Signed   By: Virgina Norfolk M.D.   On: 02/20/2023 22:37   DG Chest Port 1 View  Result Date: 02/20/2023 CLINICAL DATA:  Sepsis? EXAM: PORTABLE CHEST 1 VIEW COMPARISON:  None Available. FINDINGS: Unremarkable cardiac silhouette. Normal pulmonary vasculature. No pneumothorax or pleural effusion. Alveolar opacity left base suggests a developing pneumonia versus volume loss. This can be followed up to confirm resolution as a precaution. IMPRESSION: Alveolar density left base suggest developing pneumonia versus volume loss. Electronically Signed   By: Sammie Bench M.D.   On: 02/20/2023 19:05     Anti-infectives: Anti-infectives (From admission, onward)    Start     Dose/Rate Route Frequency Ordered Stop   02/21/23 1000  linezolid (ZYVOX) IVPB 600 mg        600 mg 300 mL/hr over 60 Minutes Intravenous Every 12 hours 02/21/23 0902     02/21/23 1000  piperacillin-tazobactam (ZOSYN) IVPB 3.375 g        3.375 g 12.5 mL/hr over 240 Minutes Intravenous Every 8 hours 02/21/23 0902     02/21/23 0600  clindamycin (CLEOCIN) IVPB 600 mg  Status:  Discontinued        600 mg 100 mL/hr over 30 Minutes Intravenous Every 8 hours 02/21/23 0237 02/21/23 0902   02/20/23 2300  ceFEPIme (MAXIPIME) 2 g in sodium chloride 0.9 % 100 mL IVPB  Status:  Discontinued        2 g 200 mL/hr over 30 Minutes Intravenous Every 12 hours 02/20/23 2222 02/21/23 0902   02/20/23 2230  clindamycin (CLEOCIN) IVPB 600 mg        600 mg 100 mL/hr over 30 Minutes Intravenous  Once 02/20/23 2216 02/20/23 2345   02/20/23 1942  vancomycin variable dose per unstable renal function (pharmacist dosing)  Status:  Discontinued         Does not apply See admin instructions 02/20/23 1942 02/21/23 0902   02/20/23 1845  vancomycin (VANCOREADY) IVPB 2000 mg/400 mL        2,000 mg 200 mL/hr over 120 Minutes Intravenous  Once 02/20/23 1838 02/20/23 2153   02/20/23 1830  vancomycin (VANCOCIN) IVPB 1000 mg/200 mL premix  Status:  Discontinued        1,000 mg 200 mL/hr over 60 Minutes Intravenous  Once 02/20/23 1824 02/20/23 1838   02/20/23 1830  cefTRIAXone (ROCEPHIN) 2 g in sodium chloride 0.9 % 100 mL IVPB        2 g 200 mL/hr over 30 Minutes Intravenous  Once 02/20/23 1824 02/20/23 2000        Assessment/Plan POD#1 s/p Excisional debridement NSTI bilateral buttocks 3/20 Dr. Donne Hazel - gram stain few GPC and GNR, culture pending. Continue broad spectrum abx and follow cultures - Plan for repeat debridement in the OR tomorrow 3/22. This was discussed with the patient's daughter at bedside.  ID - zosyn, linezolid FEN -  NPO VTE - SCDs, sq heparin Foley - in place  Septic shock - weaning pressors Acute Hypoxic Respiratory failure secondary to flu (tested positive outpatient) and LLL pneumonia Tobacco use AKI Inflammatory bowel disease HTN HLD Anxiety    LOS: 1 day    Wendy Hampshire, PA-C [facility] 02/21/2023, 11:19 AM Please see Amion for pager number during day hours 7:00am-4:30pm

## 2023-02-21 NOTE — Progress Notes (Signed)
NAME:  Wendy Crawford, MRN:  [id number], DOB:  1956-04-12, LOS: 1 ADMISSION DATE:  02/20/2023, CONSULTATION DATE:  02/20/23 REFERRING MD:  EDP, CHIEF COMPLAINT:  hypotension   History of Present Illness:  Wendy Crawford is a 67 y.o. F with PMH significant for obesity, depression, headaches, osteoarthritis and tobacco use who presents to the ED with dizziness and low blood pressure.  She began feeling poorly about one week ago when she noticed a sore on her buttock, this has grown and redness and pain has spread to both buttocks with some purulent drainage.   She then developed a cough and was diagnosed with the flu on 3/17 and has felt short of breath with nausea and vomiting and very little po intake for the last few days.  She denies chest pain, leg edema or pain.      In the ED, she was given 3.5L IVF and broad spectrum abx.  CXR consistent with LLL pneumonia.  Labs showed lactic acid of 1.7, Na 127, creatinine 1.95, WBC 115, platelets 147, WBC 11.5.   She required levophed for hypotension despite IVF, therefore PCCM consulted   Pertinent  Medical History  Osteoarthritis Cluster headaches Depression Hyperlipidemia IBS Psoriasis  Significant Hospital Events: Including procedures, antibiotic start and stop dates in addition to other pertinent events   3/20 presented with cellulitis, PNA and septic shock, PCCM admit, started on levophed  3/21 intubated, CVC line placed 3/21 debridement of wounds  Interim History / Subjective:  Overnight patient admitted and underwent first debridement of wounds. ABG revealed mixed picture acidosis, RR increased and sodium bicarb gtt initiated.   Objective   Blood pressure (!) 99/53, pulse 70, temperature 98.1 F (36.7 C), temperature source Oral, resp. rate 17, SpO2 96 %.    Vent Mode: PRVC FiO2 (%):  [70 %] 70 % Set Rate:  [18 bmp-25 bmp] 25 bmp Vt Set:  [380 mL] 380 mL PEEP:  [5 cmH20-8 cmH20] 8 cmH20 Plateau Pressure:  [17 cmH20-21 cmH20] 19  cmH20   Intake/Output Summary (Last 24 hours) at 02/21/2023 0839 Last data filed at 02/21/2023 0800 Gross per 24 hour  Intake 4852.08 ml  Output 1100 ml  Net 3752.08 ml   There were no vitals filed for this visit.  Examination: General: Ill-appearing person laying in bed, sedated HENT: [place]/AT. ETT in place. Anicteric sclerae.  Lungs: Normal work of breathing on vent. Clear to auscultation bilaterally.  Cardiovascular: Regular rate, rhythm. No murmurs appreciated. Distal pulses 2+ bilaterally.  Abdomen: Soft, non-distended. Normoactive bowel sounds.  Extremities: Warm, dry. No peripheral edema noted.  Neuro: Sedated, not arousable.   Resolved Hospital Problem list   N/A  Assessment & Plan:  #Septic shock due to Fournier's gangrene Underwent debridement overnight with general surgery. Broad-spectrum antibiotics were initiated, we will continue with these. Blood cultures negative thus far. Still requiring levophed. - Continue with linezolid, Zosyn - Follow-up blood cultures - Continue levophed for MAP goal >65 - Insert OGT to initiate feeds for adequate wound healing - Insert rectal tube to avoid wound contamination - Follow general surgery, appreciate recommendations - Fentanyl bolus PRN for agitation, pain  #Acute hypoxic respiratory failure 2/2 influenza, LLL pneumonia We will continue with mechanical intubation as patient will likely need second debridement for her wounds. We are continuing with broad-spectrum antibiotics as well. - Continue broad spectrum antibiotics as above - Continue propofol, fentanyl for sedation, RASS goal -4 during acute illness - Start stress ulcer prophylaxis, VAP bundle  #Acute  kidney injury #Anion gap metabolic acidosis Overnight pH 7.1 consistent with mixed respiratory and metabolic acidosis. Repeat ABG this AM improved to pH 7.29 more consistent with metabolic acidosis with compensated respirations. Bicarb on lab work this AM 21, most likely  related to acute renal failure. Lactic normal this AM. Cr improved from initial lab work, down to 1.5. - D/C sodium bicarb infusion - Ensure adequate renal perfusion with MAP >65 - Daily renal function panel - Strict I/O  #Hyponatremia Sodium has improved to 132 this AM. Most likely initial hyponatremia 2/2 volume depletion.  #Tobacco use disorder Holding nicotine patch for now, can re-start once ready to initiate extubation.   #Depression Holding home citalopram.  Best Practice (right click and "Reselect all SmartList Selections" daily)   Diet/type: tubefeeds DVT prophylaxis: prophylactic heparin  GI prophylaxis: PPI Lines: Central line and Arterial Line Foley:  Yes, and it is still needed Code Status:  full code Last date of multidisciplinary goals of care discussion [family updated at bedside overnight, will discuss with them again today]  Labs   CBC: Recent Labs  Lab 02/20/23 1806 02/20/23 2238 02/21/23 0306 02/21/23 0400  WBC 11.5* 13.5*  --  15.9*  HGB 11.2* 10.4* 10.5* 10.5*  HCT 33.0* 32.5* 31.0* 31.6*  MCV 85.9 88.8  --  88.3  PLT 147* 151  --  [id number]    Basic Metabolic Panel: Recent Labs  Lab 02/20/23 1806 02/20/23 2238 02/21/23 0306 02/21/23 0400  NA 127*  --  132* 131*  K 3.6  --  3.5 3.7  CL 98  --   --  98  CO2 18*  --   --  21*  GLUCOSE 122*  --   --  169*  BUN 35*  --   --  36*  CREATININE 1.95* 1.74*  --  1.52*  CALCIUM 7.9*  --   --  7.8*  MG  --   --   --  1.7  PHOS  --   --   --  3.6   GFR: Estimated Creatinine Clearance: 39.8 mL/min (A) (by C-G formula based on SCr of 1.52 mg/dL (H)). Recent Labs  Lab 02/20/23 1806 02/20/23 1823 02/20/23 2238 02/21/23 0400  PROCALCITON  --   --  7.42  --   WBC 11.5*  --  13.5* 15.9*  LATICACIDVEN  --  1.7  --  0.8    Liver Function Tests: Recent Labs  Lab 02/20/23 1806  AST 20  ALT 13  ALKPHOS 87  BILITOT 1.0  PROT 5.6*  ALBUMIN 2.5*   No results for input(s): "LIPASE", "AMYLASE" in  the last 168 hours. No results for input(s): "AMMONIA" in the last 168 hours.  ABG    Component Value Date/Time   PHART 7.188 (LL) 02/21/2023 0306   PCO2ART 53.0 (H) 02/21/2023 0306   PO2ART 83 02/21/2023 0306   HCO3 20.5 02/21/2023 0306   TCO2 22 02/21/2023 0306   ACIDBASEDEF 8.0 (H) 02/21/2023 0306   O2SAT 94 02/21/2023 0306     Coagulation Profile: Recent Labs  Lab 02/20/23 1823  INR 1.3*    Cardiac Enzymes: No results for input(s): "CKTOTAL", "CKMB", "CKMBINDEX", "TROPONINI" in the last 168 hours.  HbA1C: Hgb A1c MFr Bld  Date/Time Value Ref Range Status  01/21/2023 10:08 AM 5.8 (H) 4.8 - 5.6 % Final    Comment:             Prediabetes: 5.7 - 6.4  Diabetes: >6.4          Glycemic control for adults with diabetes: <7.0   07/26/2022 09:51 AM 5.9 (H) 4.8 - 5.6 % Final    Comment:             Prediabetes: 5.7 - 6.4          Diabetes: >6.4          Glycemic control for adults with diabetes: <7.0     CBG: Recent Labs  Lab 02/21/23 0227 02/21/23 0712  GLUCAP 157* 185*   Sanjuan Dame, MD Internal Medicine Resident PGY-3 Pager: [phone]

## 2023-02-21 NOTE — Transfer of Care (Signed)
Immediate Anesthesia Transfer of Care Note  Patient: Wendy Crawford  Procedure(s) Performed: Debridement of Perineum  Patient Location: ICU  Anesthesia Type:General  Level of Consciousness: sedated and Patient remains intubated per anesthesia plan  Airway & Oxygen Therapy: Patient remains intubated per anesthesia plan and Patient placed on Ventilator (see vital sign flow sheet for setting)  Post-op Assessment: Report given to RN and Post -op Vital signs reviewed and stable  Post vital signs: Reviewed and stable  Last Vitals:  Vitals Value Taken Time  BP    Temp    Pulse 97 02/21/23 0229  Resp 18 02/21/23 0229  SpO2 89 % 02/21/23 0229  Vitals shown include unvalidated device data.  Last Pain:  Vitals:   02/20/23 2047  TempSrc:   PainSc: 5          Complications: No notable events documented.

## 2023-02-21 NOTE — Op Note (Signed)
Excisional debridement:  1.  Progress note or procedure note with a detailed description of the procedure.  Preoperative diagnosis: NSTI bilateral buttocks Postoperative diagnosis: saa Procedure:  Excision of right buttock NSTI 13x8x5 cm Excision of left buttock 14x9x9 cm Anesthesia general EBL 100 cc Specimens cultures to micro Sponge and needle count correct Dispo icu  Indications: This is a 61 yof with NSTI of bilateral buttocks and perineum. I discussed proceeding to OR urgently for debridement  Procedure: After informed consent obtained from husband she was taken to OR. She had received antibiotics.  She was placed under general anesthesia. She had a foley, central line and arterial line placed.  She was then rolled prone and appropriately padded. She was prepped and draped in standard fshion. Surgical timeout was performed.  I made similar elliptical incisions on both buttocks. The anus was preserved. I removed a large amount of necrotic tissue consistent with NSTI.  The right side tracked into the perineum. Both sides were then packed with betadine guaze. She was transferred to icu.  2.  Tool used for debridement (curette, scapel, etc.)  cautery, curette  3.  Frequency of surgical debridement.   First time  4.  Measurement of total devitalized tissue (wound surface) before and after surgical debridement.   Excision of right buttock NSTI 13x8x5 cm Excision of left buttock 14x9x9 cm  5.  Area and depth of devitalized tissue removed from wound.  Excision of right buttock NSTI 13x8x5 cm Excision of left buttock 14x9x9 cm  6.  Blood loss and description of tissue removed.  100 cc, necrotic  7.  Evidence of the progress of the wound's response to treatment.  A.  Current wound volume (current dimensions and depth).  Excision of right buttock NSTI 13x8x5 cm Excision of left buttock 14x9x9 cm  B.  Presence (and extent of) of infection.  yes  C.  Presence (and extent of) of non  viable tissue.  Yes   D.  Other material in the wound that is expected to inhibit healing.  none  8.  Was there any viable tissue removed (measurements): no

## 2023-02-21 NOTE — Progress Notes (Addendum)
PHARMACY - PHYSICIAN COMMUNICATION CRITICAL VALUE ALERT - BLOOD CULTURE IDENTIFICATION (BCID)  Wendy Crawford is an 67 y.o. female who presented to [facility] on 02/20/2023 with a chief complaint of dizziness and hypotension with buttock wound with CT consistent with Fournier's Gangrene   Assessment:  E. Coli 1/4 bottles, no resistance mechanisms detected, possible source of large bilateral buttock wounds/Fournier's Gangrene   Name of physician (or Provider) Contacted: Dr. Collene Gobble   Current antibiotics: linezolid + piperacillin/tazobactam   Changes to prescribed antibiotics recommended:  Planning to continue current antibiotics until OR cultures result   Results for orders placed or performed during the hospital encounter of 02/20/23  Blood Culture ID Panel (Reflexed) (Collected: 02/20/2023  6:23 PM)  Result Value Ref Range   Enterococcus faecalis NOT DETECTED NOT DETECTED   Enterococcus Faecium NOT DETECTED NOT DETECTED   Listeria monocytogenes NOT DETECTED NOT DETECTED   Staphylococcus species NOT DETECTED NOT DETECTED   Staphylococcus aureus (BCID) NOT DETECTED NOT DETECTED   Staphylococcus epidermidis NOT DETECTED NOT DETECTED   Staphylococcus lugdunensis NOT DETECTED NOT DETECTED   Streptococcus species NOT DETECTED NOT DETECTED   Streptococcus agalactiae NOT DETECTED NOT DETECTED   Streptococcus pneumoniae NOT DETECTED NOT DETECTED   Streptococcus pyogenes NOT DETECTED NOT DETECTED   A.calcoaceticus-baumannii NOT DETECTED NOT DETECTED   Bacteroides fragilis NOT DETECTED NOT DETECTED   Enterobacterales DETECTED (A) NOT DETECTED   Enterobacter cloacae complex NOT DETECTED NOT DETECTED   Escherichia coli DETECTED (A) NOT DETECTED   Klebsiella aerogenes NOT DETECTED NOT DETECTED   Klebsiella oxytoca NOT DETECTED NOT DETECTED   Klebsiella pneumoniae NOT DETECTED NOT DETECTED   Proteus species NOT DETECTED NOT DETECTED   Salmonella species NOT DETECTED NOT DETECTED   Serratia  marcescens NOT DETECTED NOT DETECTED   Haemophilus influenzae NOT DETECTED NOT DETECTED   Neisseria meningitidis NOT DETECTED NOT DETECTED   Pseudomonas aeruginosa NOT DETECTED NOT DETECTED   Stenotrophomonas maltophilia NOT DETECTED NOT DETECTED   Candida albicans NOT DETECTED NOT DETECTED   Candida auris NOT DETECTED NOT DETECTED   Candida glabrata NOT DETECTED NOT DETECTED   Candida krusei NOT DETECTED NOT DETECTED   Candida parapsilosis NOT DETECTED NOT DETECTED   Candida tropicalis NOT DETECTED NOT DETECTED   Cryptococcus neoformans/gattii NOT DETECTED NOT DETECTED   CTX-M ESBL NOT DETECTED NOT DETECTED   Carbapenem resistance IMP NOT DETECTED NOT DETECTED   Carbapenem resistance KPC NOT DETECTED NOT DETECTED   Carbapenem resistance NDM NOT DETECTED NOT DETECTED   Carbapenem resist OXA 48 LIKE NOT DETECTED NOT DETECTED   Carbapenem resistance VIM NOT DETECTED NOT DETECTED    Eliseo Gum, PharmD PGY1 Pharmacy Resident   02/21/2023  1:27 PM

## 2023-02-21 NOTE — Progress Notes (Signed)
eLink Physician-Brief Progress Note Patient Name: Wendy Crawford DOB: 1956-10-17 MRN: [id number]   Date of Service  02/21/2023  HPI/Events of Note  Shortness of breath and wheezing  eICU Interventions  Add albuterol as needed     Intervention Category Minor Interventions: Routine modifications to care plan (e.g. PRN medications for pain, fever)  Gerardo Territo 02/21/2023, 10:34 PM

## 2023-02-21 NOTE — Progress Notes (Addendum)
eLink Physician-Brief Progress Note Patient Name: Wendy Crawford DOB: Apr 13, 1956 MRN: [id number]   Date of Service  02/21/2023  HPI/Events of Note  67 year old female with a history of tobacco use disorder, dyslipidemia and obesity was initially brought into the emergency department with increasing shortness of breath and pain of her right buttock found to have Fournier's gangrene with septic shock.  She had a 13 x 8 x 5 cm excision of the right buttock and 14 x 9 x 9 cm excision of the left buttock with a total EBL of 100 cc.  Central venous catheter was placed by anesthesia.  Arterial line was also placed by anesthesia.  eICU Interventions  Will order chest radiograph to verify line/tube placement.  Patient was maintained on vancomycin and.  Clindamycin was administered once, ordered discontinue.  Will renew the order for ongoing infusion.  Ordered lactic acid with morning labs.  Given mild hypoxemia on 50-70%, increase PEEP to 8.  Postop ABG pending.    0321 - Sodium went from 127 -> 132. Trend q6H. Abg back, mixed resp and metabolic acidosis. Increase RR from 18 to 25. Short duration bicarb infusion  0655 -chest radiograph reviewed, endotracheal tube sitting at the carina.  Agree with radiologist recommendation to retract ET tube, 3 cm. Discussed with oncoming RT   Intervention Category Evaluation Type: New Patient Evaluation  Demarrius Guerrero 02/21/2023, 2:33 AM

## 2023-02-21 NOTE — Anesthesia Preprocedure Evaluation (Addendum)
Anesthesia Evaluation  Patient identified by MRN, date of birth, ID band Patient awake    Reviewed: Allergy & Precautions, NPO status , Patient's Chart, lab work & pertinent test results  Airway Mallampati: Intubated       Dental  (+) Partial Lower, Upper Dentures   Pulmonary COPD, Current Smoker and Patient abstained from smoking.    + decreased breath sounds  + intubated    Cardiovascular Normal cardiovascular exam Rhythm:Regular Rate:Normal     Neuro/Psych  Headaches PSYCHIATRIC DISORDERS  Depression       GI/Hepatic PUD,,, UC    Endo/Other  Hypothyroidism  Morbid obesity PRE-DM Hyponatremia  Renal/GU Renal disease     Musculoskeletal  (+) Arthritis ,    Abdominal  (+) + obese  Peds  Hematology  (+) Blood dyscrasia, anemia   Anesthesia Other Findings Fournier's gangrene  Reproductive/Obstetrics                              Anesthesia Physical Anesthesia Plan  ASA: 4  Anesthesia Plan: General   Post-op Pain Management:    Induction: Inhalational  PONV Risk Score and Plan: 2 and Treatment may vary due to age or medical condition  Airway Management Planned: Oral ETT  Additional Equipment:   Intra-op Plan:   Post-operative Plan: Post-operative intubation/ventilation  Informed Consent:   Plan Discussed with: CRNA and Anesthesiologist  Anesthesia Plan Comments:         Anesthesia Quick Evaluation

## 2023-02-21 NOTE — Progress Notes (Signed)
Initial Nutrition Assessment  DOCUMENTATION CODES:  Obesity unspecified  INTERVENTION:  Initiate tube feeding via OGT: Vital AF at 60 ml/h (1440 ml per day) Start at 27mL and advance by 10mL q6h to goal of 60 Provides 1728 kcal, 108 gm protein, 1168 ml free water daily  NUTRITION DIAGNOSIS:  Increased nutrient needs related to wound healing as evidenced by estimated needs.  GOAL:  Patient will meet greater than or equal to 90% of their needs  MONITOR:  Vent status, Labs, TF tolerance, Skin  REASON FOR ASSESSMENT:  Ventilator, Consult Enteral/tube feeding initiation and management  ASSESSMENT:  Pt with hx of ulcerative colitis, IBS, and tobacco use presented to ED from urgent care with dizziness and hypotension. Septic on arrival due to wounds to her buttocks. Imaging showed soft tissue air and induration in the posterior right perineum, right perirectal and gluteus. Pt was taken to OR urgently for debridement.  3/21 - Op, debridement of the bilateral buttocks, returned to ICU on vent   Patient is currently intubated on ventilator support. Family at bedside state that pt has been trying to lose weight PTA so that she can have knee surgery. States that generally, pt has a good appetite and eats 2 larger meals (breakfast and dinner) but will have a smaller meal at lunch. Appetite has been poor for almost a week due to her flu dx and then later related to her sepsis. States energy has bene poor as well.   Discussed nutrition plan and all questions were answered.   Pt is scheduled to return to the OR tomorrow and then will be possibly be extubated. Pt would likely benefit from cortrak placement to aid in meeting elevated needs. Will discuss with provider after OR tomorrow.  MV: 6.9 L/min Temp (24hrs), Avg:98.6 F (37 C), Min:96.5 F (35.8 C), Max:100.6 F (38.1 C)  Propofol: 24.4 ml/hr (644kcal/d)   Intake/Output Summary (Last 24 hours) at 02/21/2023 1337 Last data filed at  02/21/2023 1300 Gross per 24 hour  Intake 5610.13 ml  Output 1190 ml  Net 4420.13 ml  Net IO Since Admission: 4,420.13 mL [02/21/23 1337]  Nutritionally Relevant Medications: Scheduled Meds:  insulin aspart  0-9 Units Subcutaneous Q4H   vancomycin    Does not apply See admin instructions   Continuous Infusions:  ceFEPime (MAXIPIME) IV Stopped (02/20/23 2315)   clindamycin (CLEOCIN) IV 600 mg (02/21/23 0515)   magnesium sulfate bolus IVPB 2 g (02/21/23 0651)   norepinephrine (LEVOPHED) Adult infusion 10 mcg/min (02/21/23 0600)   propofol (DIPRIVAN) infusion 40 mcg/kg/min (02/21/23 0600)   sodium bicarbonate 150 mEq in dextrose 5 % 1,150 mL infusion 75 mL/hr at 02/21/23 0600   PRN Meds: docusate sodium, polyethylene glycol  Labs Reviewed: Na 131 BUN 36, creatinine 1.52 CBG ranges from 157-185 mg/dL over the last 24 hours  NUTRITION - FOCUSED PHYSICAL EXAM: Flowsheet Row Most Recent Value  Orbital Region No depletion  Upper Arm Region No depletion  Thoracic and Lumbar Region No depletion  Buccal Region No depletion  Temple Region Mild depletion  Clavicle Bone Region No depletion  Clavicle and Acromion Bone Region No depletion  Scapular Bone Region No depletion  Dorsal Hand No depletion  Patellar Region No depletion  Anterior Thigh Region No depletion  Posterior Calf Region No depletion  Edema (RD Assessment) Mild  [hands, BLE]  Hair Reviewed  Eyes Reviewed  Mouth Reviewed  Skin Reviewed  Nails Reviewed   Diet Order:   Diet Order  Diet NPO time specified  Diet effective now                   EDUCATION NEEDS:  Not appropriate for education at this time  Skin:  Skin Assessment: Reviewed RN Assessment (wounds to the bilateral buttocks, s/p debridement) right buttock 13 x 8 x 5 cm excision  left buttock 14 x 9 x 9 cm excision  Last BM:  unsure  Height:  Ht Readings from Last 1 Encounters:  02/20/23 5\' 1"  (1.549 m)    Weight:  Wt Readings  from Last 1 Encounters:  02/20/23 101.6 kg    Ideal Body Weight:  47.7 kg  BMI:  There is no height or weight on file to calculate BMI.  Estimated Nutritional Needs:  Kcal:  1700-1900 kcal/d Protein:  95-120g/d Fluid:  >1.8L/d    Ranell Patrick, RD, LDN Clinical Dietitian RD pager # available in [facility]  After hours/weekend pager # available in [facility]

## 2023-02-21 NOTE — Progress Notes (Signed)
[facility] MD called requested ET tube be retracted 3 cm per x-ray results.  Tube was retracted from 23 to now 20 cm.  Pt tolerating well.

## 2023-02-21 NOTE — Progress Notes (Signed)
[facility] ADULT ICU REPLACEMENT PROTOCOL   The patient does apply for the [facility] Adult ICU Electrolyte Replacment Protocol based on the criteria listed below:   1.Exclusion criteria: TCTS, ECMO, Dialysis, and Myasthenia Gravis patients 2. Is GFR >/= 30 ml/min? Yes.    Patient's GFR today is 38 3. Is SCr </= 2? Yes.   Patient's SCr is 1.52 mg/dL 4. Did SCr increase >/= 0.5 in 24 hours? No 5.Pt's weight >40kg  Yes.   6. Abnormal electrolyte(s): mag 1.7, potassium 3.7  7. Electrolytes replaced per protocol 8.  Call MD STAT for K+ </= 2.5, Phos </= 1, or Mag </= 1 Physician:  n/a  Wendy Crawford 02/21/2023 5:47 AM

## 2023-02-22 ENCOUNTER — Inpatient Hospital Stay (HOSPITAL_COMMUNITY): Payer: Medicare Other | Admitting: Anesthesiology

## 2023-02-22 ENCOUNTER — Other Ambulatory Visit: Payer: Self-pay

## 2023-02-22 ENCOUNTER — Inpatient Hospital Stay (HOSPITAL_COMMUNITY): Payer: Medicare Other

## 2023-02-22 ENCOUNTER — Encounter (HOSPITAL_COMMUNITY): Admission: EM | Disposition: E | Payer: Self-pay | Source: Ambulatory Visit | Attending: Pulmonary Disease

## 2023-02-22 DIAGNOSIS — E039 Hypothyroidism, unspecified: Secondary | ICD-10-CM

## 2023-02-22 DIAGNOSIS — M726 Necrotizing fasciitis: Secondary | ICD-10-CM | POA: Diagnosis not present

## 2023-02-22 DIAGNOSIS — J449 Chronic obstructive pulmonary disease, unspecified: Secondary | ICD-10-CM

## 2023-02-22 DIAGNOSIS — F17210 Nicotine dependence, cigarettes, uncomplicated: Secondary | ICD-10-CM

## 2023-02-22 DIAGNOSIS — A419 Sepsis, unspecified organism: Secondary | ICD-10-CM | POA: Diagnosis not present

## 2023-02-22 DIAGNOSIS — R6521 Severe sepsis with septic shock: Secondary | ICD-10-CM | POA: Diagnosis not present

## 2023-02-22 DIAGNOSIS — D638 Anemia in other chronic diseases classified elsewhere: Secondary | ICD-10-CM

## 2023-02-22 HISTORY — PX: INCISION AND DRAINAGE PERIRECTAL ABSCESS: SHX1804

## 2023-02-22 LAB — MAGNESIUM
Magnesium: 2.5 mg/dL — ABNORMAL HIGH (ref 1.7–2.4)
Magnesium: 2.7 mg/dL — ABNORMAL HIGH (ref 1.7–2.4)

## 2023-02-22 LAB — GLUCOSE, CAPILLARY
Glucose-Capillary: 145 mg/dL — ABNORMAL HIGH (ref 70–99)
Glucose-Capillary: 139 mg/dL — ABNORMAL HIGH (ref 70–99)
Glucose-Capillary: 156 mg/dL — ABNORMAL HIGH (ref 70–99)
Glucose-Capillary: 92 mg/dL (ref 70–99)
Glucose-Capillary: 82 mg/dL (ref 70–99)
Glucose-Capillary: 82 mg/dL (ref 70–99)

## 2023-02-22 LAB — PHOSPHORUS
Phosphorus: 4.4 mg/dL (ref 2.5–4.6)
Phosphorus: 3.6 mg/dL (ref 2.5–4.6)

## 2023-02-22 LAB — RENAL FUNCTION PANEL
Albumin: 2.2 g/dL — ABNORMAL LOW (ref 3.5–5.0)
Anion gap: 11 (ref 5–15)
BUN: 48 mg/dL — ABNORMAL HIGH (ref 8–23)
CO2: 19 mmol/L — ABNORMAL LOW (ref 22–32)
Calcium: 7.3 mg/dL — ABNORMAL LOW (ref 8.9–10.3)
Chloride: 100 mmol/L (ref 98–111)
Creatinine, Ser: 1.79 mg/dL — ABNORMAL HIGH (ref 0.44–1.00)
GFR, Estimated: 31 mL/min — ABNORMAL LOW (ref >60)
Glucose, Bld: 145 mg/dL — ABNORMAL HIGH (ref 70–99)
Phosphorus: 4.3 mg/dL (ref 2.5–4.6)
Potassium: 3.6 mmol/L (ref 3.5–5.1)
Sodium: 130 mmol/L — ABNORMAL LOW (ref 135–145)

## 2023-02-22 LAB — LEGIONELLA PNEUMOPHILA SEROGP 1 UR AG: L. pneumophila Serogp 1 Ur Ag: NEGATIVE

## 2023-02-22 LAB — TRIGLYCERIDES: Triglycerides: 253 mg/dL — ABNORMAL HIGH (ref <150)

## 2023-02-22 SURGERY — INCISION AND DRAINAGE, ABSCESS, PERIRECTAL
Anesthesia: General | Laterality: Bilateral

## 2023-02-22 MED ORDER — PROPOFOL 10 MG/ML IV BOLUS
INTRAVENOUS | Status: AC
  Filled 2023-02-22: qty 20

## 2023-02-22 MED ORDER — 0.9 % SODIUM CHLORIDE (POUR BTL) OPTIME
TOPICAL | Status: DC | PRN
  Administered 2023-02-22: 1000 mL

## 2023-02-22 MED ORDER — SODIUM CHLORIDE 0.9 % IR SOLN
Status: DC | PRN
  Administered 2023-02-22: 3000 mL

## 2023-02-22 MED ORDER — LACTATED RINGERS IV SOLN: INTRAVENOUS | Status: DC | PRN

## 2023-02-22 MED ORDER — ROCURONIUM BROMIDE 10 MG/ML (PF) SYRINGE
PREFILLED_SYRINGE | INTRAVENOUS | Status: DC | PRN
  Administered 2023-02-22: 60 mg via INTRAVENOUS

## 2023-02-22 MED ORDER — FENTANYL CITRATE (PF) 250 MCG/5ML IJ SOLN
INTRAMUSCULAR | Status: AC
  Filled 2023-02-22: qty 5

## 2023-02-22 MED ORDER — POTASSIUM CHLORIDE 20 MEQ PO PACK
40.0000 meq | PACK | Freq: Once | ORAL | Status: AC
  Administered 2023-02-22: 40 meq
  Filled 2023-02-22: qty 2

## 2023-02-22 MED ORDER — ROCURONIUM BROMIDE 10 MG/ML (PF) SYRINGE
PREFILLED_SYRINGE | INTRAVENOUS | Status: AC
  Filled 2023-02-22: qty 10

## 2023-02-22 MED ORDER — MIDAZOLAM HCL 2 MG/2ML IJ SOLN
INTRAMUSCULAR | Status: AC
  Filled 2023-02-22: qty 2

## 2023-02-22 MED ORDER — CEFAZOLIN SODIUM 1 G IJ SOLR
INTRAMUSCULAR | Status: AC
  Filled 2023-02-22: qty 20

## 2023-02-22 SURGICAL SUPPLY — 30 items
BAG COUNTER SPONGE SURGICOUNT (BAG) ×2 IMPLANT
BAG SPNG CNTER NS LX DISP (BAG) ×1
BNDG GAUZE DERMACEA FLUFF 4 (GAUZE/BANDAGES/DRESSINGS) IMPLANT
BNDG GZE DERMACEA 4 6PLY (GAUZE/BANDAGES/DRESSINGS) ×2
COVER MAYO STAND STRL (DRAPES) ×2 IMPLANT
COVER SURGICAL LIGHT HANDLE (MISCELLANEOUS) ×2 IMPLANT
ELECT CAUTERY BLADE 6.4 (BLADE) ×2 IMPLANT
ELECT REM PT RETURN 9FT ADLT (ELECTROSURGICAL) ×1
ELECTRODE REM PT RTRN 9FT ADLT (ELECTROSURGICAL) ×2 IMPLANT
GAUZE SPONGE 4X4 12PLY STRL (GAUZE/BANDAGES/DRESSINGS) IMPLANT
GLOVE BIO SURGEON STRL SZ7 (GLOVE) ×2 IMPLANT
GLOVE BIOGEL PI IND STRL 7.5 (GLOVE) ×2 IMPLANT
GOWN STRL REUS W/ TWL LRG LVL3 (GOWN DISPOSABLE) ×4 IMPLANT
GOWN STRL REUS W/TWL LRG LVL3 (GOWN DISPOSABLE) ×1
HANDPIECE INTERPULSE COAX TIP (DISPOSABLE) ×1
KIT BASIN OR (CUSTOM PROCEDURE TRAY) ×2 IMPLANT
KIT TURNOVER KIT B (KITS) ×2 IMPLANT
NS IRRIG 1000ML POUR BTL (IV SOLUTION) ×2 IMPLANT
PACK GENERAL/GYN (CUSTOM PROCEDURE TRAY) IMPLANT
PACK LITHOTOMY IV (CUSTOM PROCEDURE TRAY) IMPLANT
PAD ARMBOARD 7.5X6 YLW CONV (MISCELLANEOUS) ×2 IMPLANT
PENCIL SMOKE EVACUATOR (MISCELLANEOUS) ×2 IMPLANT
SET HNDPC FAN SPRY TIP SCT (DISPOSABLE) IMPLANT
SPONGE T-LAP 18X18 ~~LOC~~+RFID (SPONGE) IMPLANT
SURGILUBE 2OZ TUBE FLIPTOP (MISCELLANEOUS) ×2 IMPLANT
SYR BULB EAR ULCER 3OZ GRN STR (SYRINGE) ×2 IMPLANT
TOWEL GREEN STERILE (TOWEL DISPOSABLE) ×2 IMPLANT
TOWEL GREEN STERILE FF (TOWEL DISPOSABLE) ×2 IMPLANT
TUBE CONNECTING 12X1/4 (SUCTIONS) ×2 IMPLANT
YANKAUER SUCT BULB TIP NO VENT (SUCTIONS) ×2 IMPLANT

## 2023-02-22 NOTE — Anesthesia Postprocedure Evaluation (Signed)
Anesthesia Post Note  Patient: Wendy Crawford  Procedure(s) Performed: IRRIGATION AND DEBRIDEMENT OF BUTTOCK (Bilateral)     Patient location during evaluation: ICU Anesthesia Type: General Level of consciousness: sedated and patient remains intubated per anesthesia plan Pain management: pain level controlled Vital Signs Assessment: post-procedure vital signs reviewed and stable Respiratory status: patient remains intubated per anesthesia plan Cardiovascular status: stable Postop Assessment: no apparent nausea or vomiting Anesthetic complications: no   No notable events documented.  Last Vitals:  Vitals:   02/22/23 1115 02/22/23 1130  BP:    Pulse: (!) 57 (!) 54  Resp: (!) 25 (!) 25  Temp:    SpO2: 98% 99%    Last Pain:  Vitals:   02/22/23 1104  TempSrc: Axillary  PainSc:                  Audry Pili

## 2023-02-22 NOTE — Progress Notes (Signed)
Patient has been resting on vent throughout the night without any signs of distress. Vent settings have not been changed and remain on PRVC 380/f25/+8/70%. ETT remains at 20cm @ lips and has been rotated. All vent checks completed. Pt will continue to be monitored.

## 2023-02-22 NOTE — Procedures (Signed)
Cortrak  Person Inserting Tube:  Smrithi Pigford D, RD Tube Type:  Cortrak - 43 inches Tube Size:  10 Tube Location:  Left nare Secured by: Bridle Technique Used to Measure Tube Placement:  Marking at nare/corner of mouth Cortrak Secured At:  67 cm Procedure Comments:  Cortrak Tube Team Note:  Consult received to place a Cortrak feeding tube.   X-ray is required, abdominal x-ray has been ordered by the Cortrak team. Please confirm tube placement before using the Cortrak tube.   If the tube becomes dislodged please keep the tube and contact the Cortrak team at [email or web address] for replacement.  If after hours and replacement cannot be delayed, place a NG tube and confirm placement with an abdominal x-ray.    Truth Wolaver, RD, LDN Clinical Dietitian RD pager # available in [facility]  After hours/weekend pager # available in [facility]    

## 2023-02-22 NOTE — Op Note (Signed)
Pre-op diagnosis: Necrotizing soft tissue infection bilateral gluteal regions and perineum Postop diagnosis: Same Procedure performed: Debridement of soft tissue infection bilateral gluteal regions and perineum Surgeon:Domonic Kimball K Kannon Baum Anesthesia: General endotracheal Indications: This is a 67 year old female with obesity and tobacco abuse who presented on 02/20/2023 with sepsis.  She had a necrotizing soft tissue infection involving both gluteal regions extending into her perineum and perirectal regions.  She underwent debridement emergently early in the morning of 02/20/2021.  She returns today for further debridement and examination of the wound.  Operative findings: The left gluteal wound measures approximately 15 x 10 cm and extends down to the muscle.  Medially it comes right against the rectal wall.  The right gluteal wound measures 18 x 12 cm.  This also extends medially to the rectal wall.  Superiorly there is a deep area that tunnels towards the pelvis.  No stool was noted in the wound.  We debrided small sections of necrotic tissue from all portions of both wounds.  There were no large areas of necrosis.  Description of procedure: The patient is brought to the operating room intubated and sedated on her ICU bed.  She was moved to a prone position on the operating table.  The dressings were removed.  Her buttocks lower back and upper posterior thighs as well as the perineum were prepped with Betadine and draped in sterile fashion.  A timeout was taken to ensure the proper patient and proper procedure.  I examined both wounds.  The inferior portion of the wounds extend towards the perineum.  However there does not seem to be any tunneling anteriorly towards the labia.  The anus and the rectal wall are in the center of these 2 large wounds with minimal surrounding skin.  Superiorly on the right there is a deeper area that seems to track towards the pelvis.  However I can see the bottom of this wound  and there does not seem to be any necrotic tissue within the wound.  There are patches of necrotic appearing adipose tissue scattered throughout both wounds.  I spent some time using cautery to debride the smaller areas.  All of the pieces are sent for pathologic examination as a collective specimen.  We did have to trim small areas (less than a centimeter) of some ischemic appearing skin at the edges of the wounds.  Once we had completed our debridement and no other ischemic or necrotic tissue was noted, I irrigated the wound thoroughly with pulse lavage using 3 L of saline.  Hemostasis was obtained with cautery.  We then dressed the wounds by packing a roll of saline moistened Kerlix in each side.  ABD dressings were applied and secured with tape.  The patient was moved back to a supine position on the ICU bed.  She was then transported back to the intensive care unit in critical condition.  All sponge, instrument, and needle counts are correct.  Imogene Burn. Georgette Dover, MD, [facility]  General Surgery   02/22/2023 8:53 AM

## 2023-02-22 NOTE — Transfer of Care (Signed)
Immediate Anesthesia Transfer of Care Note  Patient: Wendy Crawford  Procedure(s) Performed: IRRIGATION AND DEBRIDEMENT OF BUTTOCK (Bilateral)  Patient Location: ICU  Anesthesia Type:General  Level of Consciousness: sedated and Patient remains intubated per anesthesia plan  Airway & Oxygen Therapy: Patient remains intubated per anesthesia plan and Patient placed on Ventilator (see vital sign flow sheet for setting)  Post-op Assessment: Report given to RN and Post -op Vital signs reviewed and stable  Post vital signs: Reviewed and stable  Last Vitals:  Vitals Value Taken Time  BP 86/55 02/22/23 0847  Temp    Pulse 59 02/22/23 0857  Resp 25 02/22/23 0857  SpO2 93 % 02/22/23 0857  Vitals shown include unvalidated device data.  Last Pain:  Vitals:   02/22/23 0727  TempSrc: Axillary  PainSc:          Complications: No notable events documented.

## 2023-02-22 NOTE — Progress Notes (Signed)
NAME:  Wendy Crawford, MRN:  [id number], DOB:  Mar 18, 1956, LOS: 2 ADMISSION DATE:  02/20/2023, CONSULTATION DATE:  02/20/23 REFERRING MD:  EDP, CHIEF COMPLAINT:  hypotension   History of Present Illness:  Wendy Crawford is a 67 y.o. F with PMH significant for obesity, depression, headaches, osteoarthritis and tobacco use who presents to the ED with dizziness and low blood pressure.  She began feeling poorly about one week ago when she noticed a sore on her buttock, this has grown and redness and pain has spread to both buttocks with some purulent drainage.   She then developed a cough and was diagnosed with the flu on 3/17 and has felt short of breath with nausea and vomiting and very little po intake for the last few days.  She denies chest pain, leg edema or pain.      In the ED, she was given 3.5L IVF and broad spectrum abx.  CXR consistent with LLL pneumonia.  Labs showed lactic acid of 1.7, Na 127, creatinine 1.95, WBC 115, platelets 147, WBC 11.5.   She required levophed for hypotension despite IVF, therefore PCCM consulted   Pertinent  Medical History  Osteoarthritis Cluster headaches Depression Hyperlipidemia IBS Psoriasis  Significant Hospital Events: Including procedures, antibiotic start and stop dates in addition to other pertinent events   3/20 presented with cellulitis, PNA and septic shock, PCCM admit, started on levophed  3/21 intubated, CVC line placed 3/21 debridement of wounds 3/22 debridement of wounds  Interim History / Subjective:  No acute events overnight. This morning plan for another debridement with OR.   Objective   Blood pressure (!) 145/56, pulse (!) 59, temperature 97.6 F (36.4 C), temperature source Axillary, resp. rate 16, weight 106.7 kg, SpO2 98 %.    Vent Mode: PRVC FiO2 (%):  [60 %-70 %] 60 % Set Rate:  [25 bmp] 25 bmp Vt Set:  [380 mL] 380 mL PEEP:  [8 cmH20] 8 cmH20 Plateau Pressure:  [16 cmH20-21 cmH20] 20 cmH20   Intake/Output Summary  (Last 24 hours) at 02/22/2023 0851 Last data filed at 02/22/2023 [id number] Gross per 24 hour  Intake 3395.84 ml  Output 1190 ml  Net 2205.84 ml    Filed Weights   02/22/23 0500  Weight: 106.7 kg    Examination: General: Ill-appearing person laying in bed, sedated HENT: [place]/AT. ETT in place. Anicteric sclerae.  Lungs: Normal work of breathing on vent. Clear to auscultation bilaterally.  Cardiovascular: Regular rate, rhythm. No murmurs appreciated. Distal pulses 2+ bilaterally.  Abdomen: Soft, non-distended. Normoactive bowel sounds.  Extremities: Warm, dry. No peripheral edema noted.  Neuro: Sedated, not arousable.   Resolved Hospital Problem list   N/A  Assessment & Plan:  #Septic shock due to skin/soft tissue infection of gluteal region and perineum Planning for another debridement of wounds this morning with general surgery. Still requiring some Levophed for MAP goal. Blood cultures with 1/4 bottles E. Coli, we will await wound cultures prior to de-escalating. Re-start tube feeds after surgery today to promote wound healing.   - Continue with linezolid, Zosyn - Follow-up blood, wound cultures - Continue levophed for MAP goal >65 - Re-start tube feeds post-operatively - Continue rectal tube to avoid wound contamination - Follow general surgery, appreciate recommendations - Fentanyl bolus PRN for agitation, pain  #Acute hypoxic respiratory failure 2/2 influenza, LLL pneumonia Will touch base with surgery regarding need for further debridements. We are continuing with broad-spectrum antibiotics as well. Post-op will try to wean sedation.  -  Continue broad spectrum antibiotics as above - Continue propofol, fentanyl for sedation, wean to RASS goal -1/-2 post-operatively - Continue stress ulcer prophylaxis, VAP bundle  #Acute kidney injury #Mixed respiratory/metabolic acidosis Renal function slightly worse this morning compared to previous, Cr 1.79 from 1.5 yesterday. Bicarb slightly  down to 19, no anion gap. RR 25, CO2 46 yesterday on ABG. Having good urine output. Does not appear significantly hypervolemic or hypovolemic on exam.  - Re-start tube feeds post-operatively - Ensure adequate renal perfusion with MAP >65 - Daily renal function panel - Strict I/O  #Hyponatremia Sodium stable, 131 this AM.   #Pre-diabetes Sugars at goal. Continue with SSI  #Tobacco use disorder Holding nicotine patch for now, can re-start once ready to initiate extubation.   #Depression Holding home citalopram.  Best Practice (right click and "Reselect all SmartList Selections" daily)   Diet/type: tubefeeds DVT prophylaxis: prophylactic heparin  GI prophylaxis: PPI Lines: Central line and Arterial Line Foley:  Yes, and it is still needed Code Status:  full code Last date of multidisciplinary goals of care discussion [family updated at bedside overnight, will discuss with them again today]  Labs   CBC: Recent Labs  Lab 02/20/23 1806 02/20/23 2238 02/21/23 0306 02/21/23 0400 02/21/23 0839 02/21/23 1611  WBC 11.5* 13.5*  --  15.9*  --   --   HGB 11.2* 10.4* 10.5* 10.5* 10.5* 11.2*  HCT 33.0* 32.5* 31.0* 31.6* 31.0* 33.0*  MCV 85.9 88.8  --  88.3  --   --   PLT 147* 151  --  150  --   --      Basic Metabolic Panel: Recent Labs  Lab 02/20/23 1806 02/20/23 2238 02/21/23 0306 02/21/23 0400 02/21/23 0839 02/21/23 0846 02/21/23 1611 02/21/23 1612 02/22/23 0312 02/22/23 0720  NA 127*  --  132* 131* 132* 129* 132*  --   --  130*  K 3.6  --  3.5 3.7 3.9  --  4.1  --   --  3.6  CL 98  --   --  98  --   --   --   --   --  100  CO2 18*  --   --  21*  --   --   --   --   --  19*  GLUCOSE 122*  --   --  169*  --   --   --   --   --  145*  BUN 35*  --   --  36*  --   --   --   --   --  48*  CREATININE 1.95* 1.74*  --  1.52*  --   --   --   --   --  1.79*  CALCIUM 7.9*  --   --  7.8*  --   --   --   --   --  7.3*  MG  --   --   --  1.7  --   --   --  2.6* 2.5*  --    PHOS  --   --   --  3.6  --   --   --  4.1 4.4 4.3    GFR: Estimated Creatinine Clearance: 34.8 mL/min (A) (by C-G formula based on SCr of 1.79 mg/dL (H)). Recent Labs  Lab 02/20/23 1806 02/20/23 1823 02/20/23 2238 02/21/23 0400 02/21/23 0846  PROCALCITON  --   --  7.42  --   --   WBC  11.5*  --  13.5* 15.9*  --   LATICACIDVEN  --  1.7  --  0.8 1.1     Liver Function Tests: Recent Labs  Lab 02/20/23 1806 02/22/23 0720  AST 20  --   ALT 13  --   ALKPHOS 87  --   BILITOT 1.0  --   PROT 5.6*  --   ALBUMIN 2.5* 2.2*    No results for input(s): "LIPASE", "AMYLASE" in the last 168 hours. No results for input(s): "AMMONIA" in the last 168 hours.  ABG    Component Value Date/Time   PHART 7.255 (L) 02/21/2023 1611   PCO2ART 46.9 02/21/2023 1611   PO2ART 133 (H) 02/21/2023 1611   HCO3 20.9 02/21/2023 1611   TCO2 22 02/21/2023 1611   ACIDBASEDEF 6.0 (H) 02/21/2023 1611   O2SAT 99 02/21/2023 1611     Coagulation Profile: Recent Labs  Lab 02/20/23 1823  INR 1.3*     Cardiac Enzymes: No results for input(s): "CKTOTAL", "CKMB", "CKMBINDEX", "TROPONINI" in the last 168 hours.  HbA1C: Hgb A1c MFr Bld  Date/Time Value Ref Range Status  01/21/2023 10:08 AM 5.8 (H) 4.8 - 5.6 % Final    Comment:             Prediabetes: 5.7 - 6.4          Diabetes: >6.4          Glycemic control for adults with diabetes: <7.0   07/26/2022 09:51 AM 5.9 (H) 4.8 - 5.6 % Final    Comment:             Prediabetes: 5.7 - 6.4          Diabetes: >6.4          Glycemic control for adults with diabetes: <7.0     CBG: Recent Labs  Lab 02/21/23 1610 02/21/23 1909 02/21/23 2309 02/22/23 0309 02/22/23 0706  GLUCAP 133* 137* 166* 145* 139*    Sanjuan Dame, MD Internal Medicine Resident PGY-3 Pager: [phone]

## 2023-02-22 NOTE — Anesthesia Procedure Notes (Signed)
Date/Time: 02/22/2023 7:40 AM  Performed by: Leonor Liv, CRNAPre-anesthesia Checklist: Patient identified, Emergency Drugs available, Suction available, Patient being monitored and Timeout performed Patient Re-evaluated:Patient Re-evaluated prior to induction Oxygen Delivery Method: Circle system utilized Induction Type: Inhalational induction with existing ETT Placement Confirmation: positive ETCO2 Dental Injury: Teeth and Oropharynx as per pre-operative assessment

## 2023-02-22 NOTE — Progress Notes (Signed)
Pharmacy Electrolyte Replacement  Recent Labs:  Recent Labs    02/22/23 0312 02/22/23 0720  K  --  3.6  MG 2.5*  --   PHOS 4.4 4.3  CREATININE  --  1.79*    Low Critical Values (K </= 2.5, Phos </= 1, Mg </= 1) Present: None K: 3.6   MD Contacted: Dr. Silas Flood  Plan: Initiate potassium 70mEq per tube x1

## 2023-02-23 DIAGNOSIS — M726 Necrotizing fasciitis: Secondary | ICD-10-CM

## 2023-02-23 LAB — CBC
HCT: 26.6 % — ABNORMAL LOW (ref 36.0–46.0)
Hemoglobin: 9.1 g/dL — ABNORMAL LOW (ref 12.0–15.0)
MCH: 29.7 pg (ref 26.0–34.0)
MCHC: 34.2 g/dL (ref 30.0–36.0)
MCV: 86.9 fL (ref 80.0–100.0)
Platelets: 155 10*3/uL (ref 150–400)
RBC: 3.06 MIL/uL — ABNORMAL LOW (ref 3.87–5.11)
RDW: 15 % (ref 11.5–15.5)
WBC: 8 10*3/uL (ref 4.0–10.5)
nRBC: 0 % (ref 0.0–0.2)

## 2023-02-23 LAB — BASIC METABOLIC PANEL
Anion gap: 11 (ref 5–15)
BUN: 42 mg/dL — ABNORMAL HIGH (ref 8–23)
CO2: 20 mmol/L — ABNORMAL LOW (ref 22–32)
Calcium: 7.7 mg/dL — ABNORMAL LOW (ref 8.9–10.3)
Chloride: 101 mmol/L (ref 98–111)
Creatinine, Ser: 1.21 mg/dL — ABNORMAL HIGH (ref 0.44–1.00)
GFR, Estimated: 49 mL/min — ABNORMAL LOW (ref >60)
Glucose, Bld: 102 mg/dL — ABNORMAL HIGH (ref 70–99)
Potassium: 3.3 mmol/L — ABNORMAL LOW (ref 3.5–5.1)
Sodium: 132 mmol/L — ABNORMAL LOW (ref 135–145)

## 2023-02-23 LAB — CULTURE, BLOOD (ROUTINE X 2)

## 2023-02-23 LAB — TRIGLYCERIDES: Triglycerides: 260 mg/dL — ABNORMAL HIGH (ref <150)

## 2023-02-23 LAB — GLUCOSE, CAPILLARY
Glucose-Capillary: 98 mg/dL (ref 70–99)
Glucose-Capillary: 101 mg/dL — ABNORMAL HIGH (ref 70–99)
Glucose-Capillary: 151 mg/dL — ABNORMAL HIGH (ref 70–99)
Glucose-Capillary: 109 mg/dL — ABNORMAL HIGH (ref 70–99)
Glucose-Capillary: 113 mg/dL — ABNORMAL HIGH (ref 70–99)
Glucose-Capillary: 105 mg/dL — ABNORMAL HIGH (ref 70–99)

## 2023-02-23 LAB — MAGNESIUM: Magnesium: 2.4 mg/dL (ref 1.7–2.4)

## 2023-02-23 LAB — PHOSPHORUS: Phosphorus: 2.6 mg/dL (ref 2.5–4.6)

## 2023-02-23 MED ORDER — QUETIAPINE FUMARATE 50 MG PO TABS
50.0000 mg | ORAL_TABLET | Freq: Every day | ORAL | Status: DC
  Administered 2023-02-23 – 2023-02-24 (×2): 50 mg
  Filled 2023-02-23 (×2): qty 1

## 2023-02-23 MED ORDER — POTASSIUM CHLORIDE 10 MEQ/50ML IV SOLN
10.0000 meq | INTRAVENOUS | Status: AC
  Administered 2023-02-23 (×4): 10 meq via INTRAVENOUS
  Filled 2023-02-23 (×3): qty 50

## 2023-02-23 MED ORDER — MIDAZOLAM HCL 2 MG/2ML IJ SOLN
2.0000 mg | INTRAMUSCULAR | Status: DC | PRN
  Administered 2023-02-23 – 2023-03-01 (×25): 2 mg via INTRAVENOUS
  Filled 2023-02-23 (×27): qty 2

## 2023-02-23 MED ORDER — POTASSIUM CHLORIDE 20 MEQ PO PACK
20.0000 meq | PACK | ORAL | Status: AC
  Administered 2023-02-23 (×2): 20 meq
  Filled 2023-02-23 (×2): qty 1

## 2023-02-23 NOTE — Progress Notes (Addendum)
NAME:  Wendy Crawford, MRN:  [id number], DOB:  1956/10/20, LOS: 3 ADMISSION DATE:  02/20/2023, CONSULTATION DATE:  02/20/23 REFERRING MD:  EDP, CHIEF COMPLAINT:  hypotension   History of Present Illness:  Wendy Crawford is a 67 y.o. F with PMH significant for obesity, depression, headaches, osteoarthritis and tobacco use who presents to the ED with dizziness and low blood pressure.  She began feeling poorly about one week ago when she noticed a sore on her buttock, this has grown and redness and pain has spread to both buttocks with some purulent drainage.   She then developed a cough and was diagnosed with the flu on 3/17 and has felt short of breath with nausea and vomiting and very little po intake for the last few days.  She denies chest pain, leg edema or pain.      In the ED, she was given 3.5L IVF and broad spectrum abx.  CXR consistent with LLL pneumonia.  Labs showed lactic acid of 1.7, Na 127, creatinine 1.95, WBC 115, platelets 147, WBC 11.5.   She required levophed for hypotension despite IVF, therefore PCCM consulted   Pertinent  Medical History  Osteoarthritis Cluster headaches Depression Hyperlipidemia IBS Psoriasis  Significant Hospital Events: Including procedures, antibiotic start and stop dates in addition to other pertinent events   3/20 presented with cellulitis, PNA and septic shock, PCCM admit, started on levophed  3/21 intubated, CVC line placed 3/21 debridement of wounds 3/22 debridement of wounds  Interim History / Subjective:   No acute events overnight. Tried weaning sedation this morning, became more agitated and dyssynchronous with vent.   Objective   Blood pressure 104/63, pulse 67, temperature 98.8 F (37.1 C), temperature source Oral, resp. rate (!) 25, weight 110 kg, SpO2 92 %.    Vent Mode: PRVC FiO2 (%):  [50 %-60 %] 50 % Set Rate:  [25 bmp] 25 bmp Vt Set:  [380 mL] 380 mL PEEP:  [5 cmH20-8 cmH20] 5 cmH20 Plateau Pressure:  [17 cmH20-20 cmH20]  17 cmH20   Intake/Output Summary (Last 24 hours) at 02/23/2023 0714 Last data filed at 02/23/2023 [id number] Gross per 24 hour  Intake 1787.43 ml  Output 2190 ml  Net -402.57 ml    Filed Weights   02/22/23 0500 02/23/23 0433  Weight: 106.7 kg 110 kg    Examination: General: Ill-appearing person laying in bed, sedated HENT: [place]/AT. ETT in place. Anicteric sclerae.  Lungs: Normal work of breathing on vent. Clear to auscultation bilaterally.  Cardiovascular: Regular rate, rhythm. No murmurs appreciated. Distal pulses 2+ bilaterally.  Abdomen: Soft, non-distended. Normoactive bowel sounds.  Extremities: Warm, dry. No peripheral edema noted.  Neuro: Sedated, will slowly open eyes to command but otherwise will not follow commands  Resolved Hospital Problem list   N/A  Assessment & Plan:  #Septic shock due to skin/soft tissue infection of gluteal region and perineum No further debridement of wounds needed at this time per surgery. Only on low dose Levophed after bolus of fentanyl.  On fentanyl and propofol for sedation, will try to wean this as able. Bcx 1/4 growing E. Coli, wound cultures with GPCs and GNRs, will continue broad spectrum until sensitivities result.  - Continue with linezolid, Zosyn - Follow-up blood, wound cultures - Continue levophed for MAP goal >65 - Continue tube feeds - Continue rectal tube to avoid wound contamination - Follow general surgery, appreciate recommendations - Fentanyl bolus PRN for agitation, pain - Start Seroquel 50mg  QHS for agitation, can re-start  home Celexa once off linezolid  #Acute hypoxic respiratory failure 2/2 influenza, LLL pneumonia Today will try to wean sedation, RASS goal -1 / -2.  - Continue broad spectrum antibiotics as above - Continue propofol, fentanyl for sedation, wean to RASS goal -1/-2 post-operatively - Continue stress ulcer prophylaxis, VAP bundle  #Acute kidney injury #Mixed respiratory/metabolic acidosis Renal function  improving, sCr down to 1.2 this morning from 1.79 yesterday. Bicarb steady at 20.  - Re-start tube feeds post-operatively - Ensure adequate renal perfusion with MAP >65 - Daily renal function panel - Strict I/O  #Hypokalemia K 3.3, getting supplementation. Mag ok.  #Hyponatremia Sodium stable, 132 this AM.   #Pre-diabetes Sugars at goal. Continue with SSI  #Tobacco use disorder Holding nicotine patch for now, can re-start once ready to initiate extubation.   #Depression Holding home citalopram.  Best Practice (right click and "Reselect all SmartList Selections" daily)   Diet/type: tubefeeds DVT prophylaxis: prophylactic heparin  GI prophylaxis: PPI Lines: Central line and Arterial Line Foley:  Yes, and it is still needed Code Status:  full code Last date of multidisciplinary goals of care discussion [family updated at bedside 3/23]  Labs   CBC: Recent Labs  Lab 02/20/23 1806 02/20/23 2238 02/21/23 0306 02/21/23 0400 02/21/23 0839 02/21/23 1611 02/23/23 0159  WBC 11.5* 13.5*  --  15.9*  --   --  8.0  HGB 11.2* 10.4* 10.5* 10.5* 10.5* 11.2* 9.1*  HCT 33.0* 32.5* 31.0* 31.6* 31.0* 33.0* 26.6*  MCV 85.9 88.8  --  88.3  --   --  86.9  PLT 147* 151  --  150  --   --  155     Basic Metabolic Panel: Recent Labs  Lab 02/20/23 1806 02/20/23 2238 02/21/23 0306 02/21/23 0400 02/21/23 0839 02/21/23 0846 02/21/23 1611 02/21/23 1612 02/22/23 0312 02/22/23 0720 02/22/23 1539 02/23/23 0159  NA 127*  --    < > 131* 132* 129* 132*  --   --  130*  --  132*  K 3.6  --    < > 3.7 3.9  --  4.1  --   --  3.6  --  3.3*  CL 98  --   --  98  --   --   --   --   --  100  --  101  CO2 18*  --   --  21*  --   --   --   --   --  19*  --  20*  GLUCOSE 122*  --   --  169*  --   --   --   --   --  145*  --  102*  BUN 35*  --   --  36*  --   --   --   --   --  48*  --  42*  CREATININE 1.95* 1.74*  --  1.52*  --   --   --   --   --  1.79*  --  1.21*  CALCIUM 7.9*  --   --  7.8*   --   --   --   --   --  7.3*  --  7.7*  MG  --   --   --  1.7  --   --   --  2.6* 2.5*  --  2.7* 2.4  PHOS  --   --    < > 3.6  --   --   --  4.1 4.4 4.3 3.6 2.6   < > = values in this interval not displayed.    GFR: Estimated Creatinine Clearance: 52.5 mL/min (A) (by C-G formula based on SCr of 1.21 mg/dL (H)). Recent Labs  Lab 02/20/23 1806 02/20/23 1823 02/20/23 2238 02/21/23 0400 02/21/23 0846 02/23/23 0159  PROCALCITON  --   --  7.42  --   --   --   WBC 11.5*  --  13.5* 15.9*  --  8.0  LATICACIDVEN  --  1.7  --  0.8 1.1  --      Liver Function Tests: Recent Labs  Lab 02/20/23 1806 02/22/23 0720  AST 20  --   ALT 13  --   ALKPHOS 87  --   BILITOT 1.0  --   PROT 5.6*  --   ALBUMIN 2.5* 2.2*    No results for input(s): "LIPASE", "AMYLASE" in the last 168 hours. No results for input(s): "AMMONIA" in the last 168 hours.  ABG    Component Value Date/Time   PHART 7.255 (L) 02/21/2023 1611   PCO2ART 46.9 02/21/2023 1611   PO2ART 133 (H) 02/21/2023 1611   HCO3 20.9 02/21/2023 1611   TCO2 22 02/21/2023 1611   ACIDBASEDEF 6.0 (H) 02/21/2023 1611   O2SAT 99 02/21/2023 1611     Coagulation Profile: Recent Labs  Lab 02/20/23 1823  INR 1.3*     Cardiac Enzymes: No results for input(s): "CKTOTAL", "CKMB", "CKMBINDEX", "TROPONINI" in the last 168 hours.  HbA1C: Hgb A1c MFr Bld  Date/Time Value Ref Range Status  01/21/2023 10:08 AM 5.8 (H) 4.8 - 5.6 % Final    Comment:             Prediabetes: 5.7 - 6.4          Diabetes: >6.4          Glycemic control for adults with diabetes: <7.0   07/26/2022 09:51 AM 5.9 (H) 4.8 - 5.6 % Final    Comment:             Prediabetes: 5.7 - 6.4          Diabetes: >6.4          Glycemic control for adults with diabetes: <7.0     CBG: Recent Labs  Lab 02/22/23 1102 02/22/23 1536 02/22/23 1907 02/22/23 2305 02/23/23 0324  GLUCAP 156* 92 82 82 98    Sanjuan Dame, MD Internal Medicine Resident PGY-3 Pager:  [phone]

## 2023-02-23 NOTE — Progress Notes (Signed)
[facility] ADULT ICU REPLACEMENT PROTOCOL   The patient does apply for the [facility] Adult ICU Electrolyte Replacment Protocol based on the criteria listed below:   1.Exclusion criteria: TCTS, ECMO, Dialysis, and Myasthenia Gravis patients 2. Is GFR >/= 30 ml/min? Yes.    Patient's GFR today is 49 3. Is SCr </= 2? Yes.   Patient's SCr is 1.21 mg/dL 4. Did SCr increase >/= 0.5 in 24 hours? No. 5.Pt's weight >40kg  Yes.   6. Abnormal electrolyte(s): K  7. Electrolytes replaced per protocol 8.  Call MD STAT for K+ </= 2.5, Phos </= 1, or Mag </= 1 Physician:  Tera Helper E Yorley Buch 02/23/2023 5:06 AM

## 2023-02-23 NOTE — Progress Notes (Signed)
[facility] Progress Note  1 Day Post-Op  Subjective: CC-  On the vent. Weaning pressors, on levo 1 mcg WBC WNL, afebrile  Objective: Vital signs in last 24 hours: Temp:  [97.6 F (36.4 C)-98.9 F (37.2 C)] 98.9 F (37.2 C) (03/23 0725) Pulse Rate:  [53-70] 70 (03/23 0700) Resp:  [19-25] 22 (03/23 0700) BP: (104)/(63) 104/63 (03/22 0900) SpO2:  [92 %-100 %] 92 % (03/23 0700) Arterial Line BP: (101-136)/(50-68) 122/54 (03/23 0700) FiO2 (%):  [50 %-60 %] 50 % (03/23 0400) Weight:  [110 kg] 110 kg (03/23 0433) Last BM Date :  (PTA)  Intake/Output from previous day: 03/22 0701 - 03/23 0700 In: 1871.7 [I.V.:1154.9; NG/GT:542; IV Piggyback:174.9] Out: 2190 [Urine:1885; Blood:5] Intake/Output this shift: No intake/output data recorded.  PE: Gen:  on the vent HEENT: [place] in place Pulm:  mechanically ventilated GU: large bilateral buttock wounds pictured below - clean without any purulent drainage or induration, no bleeding    Lab Results:  Recent Labs    02/21/23 0400 02/21/23 0839 02/21/23 1611 02/23/23 0159  WBC 15.9*  --   --  8.0  HGB 10.5*   < > 11.2* 9.1*  HCT 31.6*   < > 33.0* 26.6*  PLT 150  --   --  155   < > = values in this interval not displayed.   BMET Recent Labs    02/22/23 0720 02/23/23 0159  NA 130* 132*  K 3.6 3.3*  CL 100 101  CO2 19* 20*  GLUCOSE 145* 102*  BUN 48* 42*  CREATININE 1.79* 1.21*  CALCIUM 7.3* 7.7*   PT/INR Recent Labs    02/20/23 1823  LABPROT 16.3*  INR 1.3*   CMP     Component Value Date/Time   NA 132 (L) 02/23/2023 0159   NA 141 01/21/2023 1008   K 3.3 (L) 02/23/2023 0159   CL 101 02/23/2023 0159   CO2 20 (L) 02/23/2023 0159   GLUCOSE 102 (H) 02/23/2023 0159   BUN 42 (H) 02/23/2023 0159   BUN 14 01/21/2023 1008   CREATININE 1.21 (H) 02/23/2023 0159   CREATININE 0.87 04/11/2021 0838   CALCIUM 7.7 (L) 02/23/2023 0159   PROT 5.6 (L) 02/20/2023 1806   PROT 7.1 01/21/2023 1008   ALBUMIN  2.2 (L) 02/22/2023 0720   ALBUMIN 4.8 01/21/2023 1008   AST 20 02/20/2023 1806   ALT 13 02/20/2023 1806   ALKPHOS 87 02/20/2023 1806   BILITOT 1.0 02/20/2023 1806   BILITOT 0.3 01/21/2023 1008   GFRNONAA 49 (L) 02/23/2023 0159   GFRNONAA 70 04/11/2021 0838   GFRAA 82 04/11/2021 0838   Lipase  No results found for: "LIPASE"     Studies/Results: DG Abd Portable 1V  Result Date: 02/22/2023 CLINICAL DATA:  Feeding tube placement EXAM: PORTABLE ABDOMEN - 1 VIEW COMPARISON:  CT abdomen/pelvis 2 days prior FINDINGS: The enteric catheter tip is likely in the distal stomach. There is a paucity of bowel gas in the abdomen without evidence of mechanical obstruction. IMPRESSION: Enteric catheter tip likely in the distal stomach. Electronically Signed   By: Valetta Mole M.D.   On: 02/22/2023 11:51    Anti-infectives: Anti-infectives (From admission, onward)    Start     Dose/Rate Route Frequency Ordered Stop   02/21/23 1000  linezolid (ZYVOX) IVPB 600 mg        600 mg 300 mL/hr over 60 Minutes Intravenous Every 12 hours 02/21/23 0902     02/21/23 1000  piperacillin-tazobactam (ZOSYN) IVPB 3.375 g        3.375 g 12.5 mL/hr over 240 Minutes Intravenous Every 8 hours 02/21/23 0902     02/21/23 0600  clindamycin (CLEOCIN) IVPB 600 mg  Status:  Discontinued        600 mg 100 mL/hr over 30 Minutes Intravenous Every 8 hours 02/21/23 0237 02/21/23 0902   02/20/23 2300  ceFEPIme (MAXIPIME) 2 g in sodium chloride 0.9 % 100 mL IVPB  Status:  Discontinued        2 g 200 mL/hr over 30 Minutes Intravenous Every 12 hours 02/20/23 2222 02/21/23 0902   02/20/23 2230  clindamycin (CLEOCIN) IVPB 600 mg        600 mg 100 mL/hr over 30 Minutes Intravenous  Once 02/20/23 2216 02/20/23 2345   02/20/23 1942  vancomycin variable dose per unstable renal function (pharmacist dosing)  Status:  Discontinued         Does not apply See admin instructions 02/20/23 1942 02/21/23 0902   02/20/23 1845  vancomycin  (VANCOREADY) IVPB 2000 mg/400 mL        2,000 mg 200 mL/hr over 120 Minutes Intravenous  Once 02/20/23 1838 02/20/23 2153   02/20/23 1830  vancomycin (VANCOCIN) IVPB 1000 mg/200 mL premix  Status:  Discontinued        1,000 mg 200 mL/hr over 60 Minutes Intravenous  Once 02/20/23 1824 02/20/23 1838   02/20/23 1830  cefTRIAXone (ROCEPHIN) 2 g in sodium chloride 0.9 % 100 mL IVPB        2 g 200 mL/hr over 30 Minutes Intravenous  Once 02/20/23 1824 02/20/23 2000        Assessment/Plan -POD#3 s/p Excisional debridement NSTI bilateral buttocks 3/20 Dr. Donne Hazel -POD#1 s/p Debridement of soft tissue infection bilateral gluteal regions and perineum  3/22 Dr. Georgette Dover - gram stain few GPC and GNR, culture pending. Continue broad spectrum abx and follow cultures - Wounds are very clean, no indication for further surgical debridement. BID wet to dry dressing changes - pack each wound with saline moistened kerlex.  - Consider flexiseal if she starts having loose stool. May need to consider diverting colostomy if stool contamination of the wounds becomes an issue We will see again on Monday, call sooner with any issues   ID - zosyn, linezolid FEN - TF VTE - SCDs, sq heparin Foley - in place   Septic shock - weaning pressors Acute Hypoxic Respiratory failure secondary to flu (tested positive outpatient) and LLL pneumonia - weaning vent Tobacco use AKI Inflammatory bowel disease HTN HLD Anxiety    LOS: 3 days    Wellington Hampshire, [facility] 02/23/2023, 8:11 AM Please see Amion for pager number during day hours 7:00am-4:30pm

## 2023-02-23 NOTE — Progress Notes (Signed)
eLink Physician-Brief Progress Note Patient Name: JING AYRE DOB: 05/24/1956 MRN: [id number]   Date of Service  02/23/2023  HPI/Events of Note  Notified of vent asynchrony despite fentanyl and propofol.   eICU Interventions  Increase fentanyl gtt upto 292mcg/hr.  Add versed IV PRN .         Elsie Lincoln 02/23/2023, 11:05 PM

## 2023-02-24 ENCOUNTER — Inpatient Hospital Stay (HOSPITAL_COMMUNITY): Payer: Medicare Other

## 2023-02-24 ENCOUNTER — Encounter (HOSPITAL_COMMUNITY): Payer: Self-pay | Admitting: Surgery

## 2023-02-24 DIAGNOSIS — R6521 Severe sepsis with septic shock: Secondary | ICD-10-CM | POA: Diagnosis not present

## 2023-02-24 DIAGNOSIS — A419 Sepsis, unspecified organism: Secondary | ICD-10-CM | POA: Diagnosis not present

## 2023-02-24 LAB — AEROBIC/ANAEROBIC CULTURE W GRAM STAIN (SURGICAL/DEEP WOUND)

## 2023-02-24 LAB — GLUCOSE, CAPILLARY
Glucose-Capillary: 112 mg/dL — ABNORMAL HIGH (ref 70–99)
Glucose-Capillary: 108 mg/dL — ABNORMAL HIGH (ref 70–99)
Glucose-Capillary: 102 mg/dL — ABNORMAL HIGH (ref 70–99)
Glucose-Capillary: 112 mg/dL — ABNORMAL HIGH (ref 70–99)
Glucose-Capillary: 119 mg/dL — ABNORMAL HIGH (ref 70–99)
Glucose-Capillary: 131 mg/dL — ABNORMAL HIGH (ref 70–99)

## 2023-02-24 LAB — BASIC METABOLIC PANEL
Anion gap: 10 (ref 5–15)
BUN: 40 mg/dL — ABNORMAL HIGH (ref 8–23)
CO2: 20 mmol/L — ABNORMAL LOW (ref 22–32)
Calcium: 7.6 mg/dL — ABNORMAL LOW (ref 8.9–10.3)
Chloride: 104 mmol/L (ref 98–111)
Creatinine, Ser: 1.15 mg/dL — ABNORMAL HIGH (ref 0.44–1.00)
GFR, Estimated: 53 mL/min — ABNORMAL LOW (ref >60)
Glucose, Bld: 119 mg/dL — ABNORMAL HIGH (ref 70–99)
Potassium: 4.1 mmol/L (ref 3.5–5.1)
Sodium: 134 mmol/L — ABNORMAL LOW (ref 135–145)

## 2023-02-24 LAB — CBC
HCT: 31.3 % — ABNORMAL LOW (ref 36.0–46.0)
Hemoglobin: 9.8 g/dL — ABNORMAL LOW (ref 12.0–15.0)
MCH: 28.6 pg (ref 26.0–34.0)
MCHC: 31.3 g/dL (ref 30.0–36.0)
MCV: 91.3 fL (ref 80.0–100.0)
Platelets: 178 10*3/uL (ref 150–400)
RBC: 3.43 MIL/uL — ABNORMAL LOW (ref 3.87–5.11)
RDW: 15.6 % — ABNORMAL HIGH (ref 11.5–15.5)
WBC: 11.5 10*3/uL — ABNORMAL HIGH (ref 4.0–10.5)
nRBC: 0 % (ref 0.0–0.2)

## 2023-02-24 MED ORDER — ONDANSETRON HCL 4 MG/2ML IJ SOLN
4.0000 mg | Freq: Four times a day (QID) | INTRAMUSCULAR | Status: DC | PRN
  Administered 2023-02-24: 4 mg via INTRAVENOUS
  Filled 2023-02-24: qty 2

## 2023-02-24 MED ORDER — FUROSEMIDE 10 MG/ML IJ SOLN
40.0000 mg | Freq: Once | INTRAMUSCULAR | Status: AC
  Administered 2023-02-24: 40 mg via INTRAVENOUS
  Filled 2023-02-24: qty 4

## 2023-02-24 NOTE — Progress Notes (Signed)
eLink Physician-Brief Progress Note Patient Name: Wendy Crawford DOB: [birth year] MRN: [id number]   Date of Service  02/24/2023  HPI/Events of Note  Notified of episode of emesis.  Pt remains intubated.   eICU Interventions  Zofran IV PRN ordered.      Intervention Category Minor Interventions: Other:  Elsie Lincoln 02/24/2023, 5:57 AM

## 2023-02-24 NOTE — Progress Notes (Signed)
NAME:  Wendy Crawford, MRN:  [id number], DOB:  [birth year], LOS: 4 ADMISSION DATE:  02/20/2023, CONSULTATION DATE:  02/20/23 REFERRING MD:  EDP, CHIEF COMPLAINT:  hypotension   History of Present Illness:  Wendy Crawford is a 67 y.o. F with PMH significant for obesity, depression, headaches, osteoarthritis and tobacco use who presents to the ED with dizziness and low blood pressure.  She began feeling poorly about one week ago when she noticed a sore on her buttock, this has grown and redness and pain has spread to both buttocks with some purulent drainage.   She then developed a cough and was diagnosed with the flu on 3/17 and has felt short of breath with nausea and vomiting and very little po intake for the last few days.  She denies chest pain, leg edema or pain.      In the ED, she was given 3.5L IVF and broad spectrum abx.  CXR consistent with LLL pneumonia.  Labs showed lactic acid of 1.7, Na 127, creatinine 1.95, WBC 115, platelets 147, WBC 11.5.   She required levophed for hypotension despite IVF, therefore PCCM consulted   Pertinent  Medical History  Osteoarthritis Cluster headaches Depression Hyperlipidemia IBS Psoriasis  Significant Hospital Events: Including procedures, antibiotic start and stop dates in addition to other pertinent events   3/20 presented with cellulitis, PNA and septic shock, PCCM admit, started on levophed  3/21 intubated, CVC line placed 3/21 debridement of wounds 3/22 debridement of wounds  Interim History / Subjective:  Episode of vomiting overnight.  Back on levophed.  Increased FiO2/PEEP.  Objective   Blood pressure (!) 105/53, pulse 68, temperature 99.1 F (37.3 C), temperature source Oral, resp. rate (!) 25, weight 111.5 kg, SpO2 94 %.    Vent Mode: PRVC FiO2 (%):  [50 %-70 %] 70 % Set Rate:  [25 bmp] 25 bmp Vt Set:  [380 mL] 380 mL PEEP:  [2 cmH20-8 cmH20] 8 cmH20 Plateau Pressure:  [9 [id number] cmH20] 9 cmH20   Intake/Output Summary (Last  24 hours) at 02/24/2023 [id number] Last data filed at 02/24/2023 0700 Gross per 24 hour  Intake 4312.32 ml  Output 1700 ml  Net 2612.32 ml   Filed Weights   02/22/23 0500 02/23/23 0433 02/24/23 0359  Weight: 106.7 kg 110 kg 111.5 kg    Examination:  General - sedated Eyes - pupils reactive ENT - ETT in place Cardiac - regular rate/rhythm, no murmur Chest - scattered rhonchi Abdomen - soft, non tender, + bowel sounds Extremities - 2+ edema Skin - wound dressing clean Neuro - RASS -2    Resolved Hospital Problem list   AKI from ATN 2nd to sepsis, Anion gap metabolic acidosis with lactic acidosis  Assessment & Plan:   Acute hypoxic respiratory failure from Influenza infection, LLL pneumonia, and pulmonary edema. - goal SpO2 > 92% - f/u CXR intermittently - lasix 40 mg IV x one on 3/24  Septic shock from necrotizing soft tissue infection of b/l buttock and perineum. E coli in wound and blood. - pressors to keep MAP > 65 - wound care per surgery - day 5 of Abx >> d/c linezolid and continue zosyn  Hyperglycemia. - SSI  Acute metabolic encephalopathy from sepsis. Depression. - RASS goal 0 to -1 - hold outpt citalopram - continue seroquel qhs  Anemia of critical illness and chronic disease. - f/u CBC  Vomiting. - hold tube feeds in AM of 3/24 and try to resume in afternoon - prn zofran  Best Practice (right click and "Reselect all SmartList Selections" daily)   Diet/type: tubefeeds DVT prophylaxis: prophylactic heparin  GI prophylaxis: PPI Lines: Central line and Arterial Line Foley:  Yes, and it is still needed Code Status:  full code Last date of multidisciplinary goals of care discussion [family updated at bedside 3/23]  Labs   CBC: Recent Labs  Lab 02/20/23 1806 02/20/23 2238 02/21/23 0306 02/21/23 0400 02/21/23 0839 02/21/23 1611 02/23/23 0159 02/24/23 0400  WBC 11.5* 13.5*  --  15.9*  --   --  8.0 11.5*  HGB 11.2* 10.4*   < > 10.5* 10.5* 11.2*  9.1* 9.8*  HCT 33.0* 32.5*   < > 31.6* 31.0* 33.0* 26.6* 31.3*  MCV 85.9 88.8  --  88.3  --   --  86.9 91.3  PLT 147* 151  --  150  --   --  155 178   < > = values in this interval not displayed.    Basic Metabolic Panel: Recent Labs  Lab 02/20/23 1806 02/20/23 2238 02/21/23 0306 02/21/23 0400 02/21/23 0839 02/21/23 0846 02/21/23 1611 02/21/23 1612 02/22/23 0312 02/22/23 0720 02/22/23 1539 02/23/23 0159 02/24/23 0400  NA 127*  --    < > 131* 132* 129* 132*  --   --  130*  --  132* 134*  K 3.6  --    < > 3.7 3.9  --  4.1  --   --  3.6  --  3.3* 4.1  CL 98  --   --  98  --   --   --   --   --  100  --  101 104  CO2 18*  --   --  21*  --   --   --   --   --  19*  --  20* 20*  GLUCOSE 122*  --   --  169*  --   --   --   --   --  145*  --  102* 119*  BUN 35*  --   --  36*  --   --   --   --   --  48*  --  42* 40*  CREATININE 1.95* 1.74*  --  1.52*  --   --   --   --   --  1.79*  --  1.21* 1.15*  CALCIUM 7.9*  --   --  7.8*  --   --   --   --   --  7.3*  --  7.7* 7.6*  MG  --   --   --  1.7  --   --   --  2.6* 2.5*  --  2.7* 2.4  --   PHOS  --   --    < > 3.6  --   --   --  4.1 4.4 4.3 3.6 2.6  --    < > = values in this interval not displayed.   GFR: Estimated Creatinine Clearance: 55.7 mL/min (A) (by C-G formula based on SCr of 1.15 mg/dL (H)). Recent Labs  Lab 02/20/23 1823 02/20/23 2238 02/21/23 0400 02/21/23 0846 02/23/23 0159 02/24/23 0400  PROCALCITON  --  7.42  --   --   --   --   WBC  --  13.5* 15.9*  --  8.0 11.5*  LATICACIDVEN 1.7  --  0.8 1.1  --   --     Liver Function Tests: Recent Labs  Lab  02/20/23 1806 02/22/23 0720  AST 20  --   ALT 13  --   ALKPHOS 87  --   BILITOT 1.0  --   PROT 5.6*  --   ALBUMIN 2.5* 2.2*   No results for input(s): "LIPASE", "AMYLASE" in the last 168 hours. No results for input(s): "AMMONIA" in the last 168 hours.  ABG    Component Value Date/Time   PHART 7.255 (L) 02/21/2023 1611   PCO2ART 46.9 02/21/2023 1611    PO2ART 133 (H) 02/21/2023 1611   HCO3 20.9 02/21/2023 1611   TCO2 22 02/21/2023 1611   ACIDBASEDEF 6.0 (H) 02/21/2023 1611   O2SAT 99 02/21/2023 1611     Coagulation Profile: Recent Labs  Lab 02/20/23 1823  INR 1.3*    Cardiac Enzymes: No results for input(s): "CKTOTAL", "CKMB", "CKMBINDEX", "TROPONINI" in the last 168 hours.  HbA1C: Hgb A1c MFr Bld  Date/Time Value Ref Range Status  01/21/2023 10:08 AM 5.8 (H) 4.8 - 5.6 % Final    Comment:             Prediabetes: 5.7 - 6.4          Diabetes: >6.4          Glycemic control for adults with diabetes: <7.0   07/26/2022 09:51 AM 5.9 (H) 4.8 - 5.6 % Final    Comment:             Prediabetes: 5.7 - 6.4          Diabetes: >6.4          Glycemic control for adults with diabetes: <7.0     CBG: Recent Labs  Lab 02/23/23 1547 02/23/23 1902 02/23/23 2300 02/24/23 0316 02/24/23 0720  GLUCAP 109* 113* 105* 112* 108*   Critical care time 34 minutes  Chesley Mires, MD [place] Pager - [phone] or [phone] 02/24/2023, 8:15 AM

## 2023-02-25 ENCOUNTER — Inpatient Hospital Stay (HOSPITAL_COMMUNITY): Payer: Medicare Other

## 2023-02-25 ENCOUNTER — Ambulatory Visit (INDEPENDENT_AMBULATORY_CARE_PROVIDER_SITE_OTHER): Payer: Medicare Other | Admitting: Family Medicine

## 2023-02-25 DIAGNOSIS — R6521 Severe sepsis with septic shock: Secondary | ICD-10-CM | POA: Diagnosis not present

## 2023-02-25 DIAGNOSIS — A419 Sepsis, unspecified organism: Secondary | ICD-10-CM | POA: Diagnosis not present

## 2023-02-25 LAB — BASIC METABOLIC PANEL
Anion gap: 12 (ref 5–15)
BUN: 34 mg/dL — ABNORMAL HIGH (ref 8–23)
CO2: 22 mmol/L (ref 22–32)
Calcium: 7.8 mg/dL — ABNORMAL LOW (ref 8.9–10.3)
Chloride: 101 mmol/L (ref 98–111)
Creatinine, Ser: 0.94 mg/dL (ref 0.44–1.00)
GFR, Estimated: >60 mL/min (ref >60)
Glucose, Bld: 124 mg/dL — ABNORMAL HIGH (ref 70–99)
Potassium: 3.6 mmol/L (ref 3.5–5.1)
Sodium: 135 mmol/L (ref 135–145)

## 2023-02-25 LAB — CBC
HCT: 28.8 % — ABNORMAL LOW (ref 36.0–46.0)
Hemoglobin: 9 g/dL — ABNORMAL LOW (ref 12.0–15.0)
MCH: 28.4 pg (ref 26.0–34.0)
MCHC: 31.3 g/dL (ref 30.0–36.0)
MCV: 90.9 fL (ref 80.0–100.0)
Platelets: 211 10*3/uL (ref 150–400)
RBC: 3.17 MIL/uL — ABNORMAL LOW (ref 3.87–5.11)
RDW: 15.8 % — ABNORMAL HIGH (ref 11.5–15.5)
WBC: 9.4 10*3/uL (ref 4.0–10.5)
nRBC: 0 % (ref 0.0–0.2)

## 2023-02-25 LAB — POCT I-STAT 7, (LYTES, BLD GAS, ICA,H+H)
Acid-base deficit: 2 mmol/L (ref 0.0–2.0)
Bicarbonate: 23.3 mmol/L (ref 20.0–28.0)
Calcium, Ion: 1.14 mmol/L — ABNORMAL LOW (ref 1.15–1.40)
HCT: 27 % — ABNORMAL LOW (ref 36.0–46.0)
Hemoglobin: 9.2 g/dL — ABNORMAL LOW (ref 12.0–15.0)
O2 Saturation: 96 %
Patient temperature: 100.3
Potassium: 3.8 mmol/L (ref 3.5–5.1)
Sodium: 137 mmol/L (ref 135–145)
TCO2: 25 mmol/L (ref 22–32)
pCO2 arterial: 44.7 mmHg (ref 32–48)
pH, Arterial: 7.329 — ABNORMAL LOW (ref 7.35–7.45)
pO2, Arterial: 96 mmHg (ref 83–108)

## 2023-02-25 LAB — GLUCOSE, CAPILLARY
Glucose-Capillary: 125 mg/dL — ABNORMAL HIGH (ref 70–99)
Glucose-Capillary: 112 mg/dL — ABNORMAL HIGH (ref 70–99)
Glucose-Capillary: 122 mg/dL — ABNORMAL HIGH (ref 70–99)
Glucose-Capillary: 144 mg/dL — ABNORMAL HIGH (ref 70–99)
Glucose-Capillary: 118 mg/dL — ABNORMAL HIGH (ref 70–99)
Glucose-Capillary: 112 mg/dL — ABNORMAL HIGH (ref 70–99)

## 2023-02-25 LAB — SURGICAL PATHOLOGY

## 2023-02-25 LAB — TRIGLYCERIDES: Triglycerides: 214 mg/dL — ABNORMAL HIGH (ref <150)

## 2023-02-25 LAB — MAGNESIUM: Magnesium: 2.4 mg/dL (ref 1.7–2.4)

## 2023-02-25 LAB — PHOSPHORUS: Phosphorus: 3.5 mg/dL (ref 2.5–4.6)

## 2023-02-25 MED ORDER — CITALOPRAM HYDROBROMIDE 20 MG PO TABS
20.0000 mg | ORAL_TABLET | Freq: Every day | ORAL | Status: DC
  Administered 2023-02-25 – 2023-02-28 (×4): 20 mg
  Filled 2023-02-25 (×5): qty 1

## 2023-02-25 MED ORDER — FUROSEMIDE 10 MG/ML IJ SOLN
40.0000 mg | Freq: Two times a day (BID) | INTRAMUSCULAR | Status: AC
  Administered 2023-02-25 (×2): 40 mg via INTRAVENOUS
  Filled 2023-02-25 (×2): qty 4

## 2023-02-25 MED ORDER — POTASSIUM CHLORIDE 20 MEQ PO PACK
40.0000 meq | PACK | Freq: Once | ORAL | Status: AC
  Administered 2023-02-25: 40 meq
  Filled 2023-02-25: qty 2

## 2023-02-25 MED ORDER — ENOXAPARIN SODIUM 60 MG/0.6ML IJ SOSY
50.0000 mg | PREFILLED_SYRINGE | INTRAMUSCULAR | Status: DC
  Administered 2023-02-25 – 2023-02-28 (×4): 50 mg via SUBCUTANEOUS
  Filled 2023-02-25 (×5): qty 0.6

## 2023-02-25 NOTE — Progress Notes (Signed)
NAME:  Wendy Crawford, MRN:  [id number], DOB:  October 03, 1956, LOS: 5 ADMISSION DATE:  02/20/2023, CONSULTATION DATE:  02/20/23 REFERRING MD:  EDP, CHIEF COMPLAINT:  hypotension   History of Present Illness:  Wendy Crawford is a 67 y.o. F with PMH significant for obesity, depression, headaches, osteoarthritis and tobacco use who presents to the ED with dizziness and low blood pressure.  She began feeling poorly about one week ago when she noticed a sore on her buttock, this has grown and redness and pain has spread to both buttocks with some purulent drainage.   She then developed a cough and was diagnosed with the flu on 3/17 and has felt short of breath with nausea and vomiting and very little po intake for the last few days.  She denies chest pain, leg edema or pain.      In the ED, she was given 3.5L IVF and broad spectrum abx.  CXR consistent with LLL pneumonia.  Labs showed lactic acid of 1.7, Na 127, creatinine 1.95, WBC 115, platelets 147, WBC 11.5.   She required levophed for hypotension despite IVF, therefore PCCM consulted   Pertinent  Medical History  Osteoarthritis Cluster headaches Depression Hyperlipidemia IBS Psoriasis  Significant Hospital Events: Including procedures, antibiotic start and stop dates in addition to other pertinent events   3/20 presented with cellulitis, PNA and septic shock, PCCM admit, started on levophed  3/21 intubated, CVC line placed 3/21 debridement of wounds 3/22 debridement of wounds  Interim History / Subjective:  Patient is seen at bedside with her daughter.  She is sedated but opens eyes to verbal stimulation.  Objective   Blood pressure (!) 105/53, pulse 81, temperature 100.1 F (37.8 C), temperature source Oral, resp. rate 18, weight 111.2 kg, SpO2 95 %.    Vent Mode: PRVC FiO2 (%):  [50 %-60 %] 50 % Set Rate:  [25 bmp] 25 bmp Vt Set:  [380 mL] 380 mL PEEP:  [5 cmH20-8 cmH20] 5 cmH20 Plateau Pressure:  [18 cmH20-21 cmH20] 18 cmH20    Intake/Output Summary (Last 24 hours) at 02/25/2023 0736 Last data filed at 02/25/2023 0600 Gross per 24 hour  Intake 2839.06 ml  Output 2865 ml  Net -25.94 ml    Filed Weights   02/23/23 0433 02/24/23 0359 02/25/23 0500  Weight: 110 kg 111.5 kg 111.2 kg    Examination: Physical Exam Constitutional:      Comments: Sedated.  Open eyes to verbal stimulation  HENT:     Head: Normocephalic.  Eyes:     General:        Right eye: No discharge.        Left eye: No discharge.     Conjunctiva/sclera: Conjunctivae normal.  Cardiovascular:     Rate and Rhythm: Normal rate and regular rhythm.     Comments: Trace edema bilateral lower and upper extremities Pulmonary:     Effort: Pulmonary effort is normal.     Comments: Crackles heard at lung bases Abdominal:     General: Bowel sounds are normal. There is no distension.     Palpations: Abdomen is soft.  Skin:    General: Skin is warm.  Neurological:     Comments: [facility] Problem list   AKI from ATN 2nd to sepsis, Anion gap metabolic acidosis with lactic acidosis  Assessment & Plan:  Acute hypoxic respiratory failure from LLL pneumonia, and pulmonary edema, requiring mechanical ventilator Chest x-ray yesterday showed interval development  of the right base atelectasis with similar left basilar consolidation.  She is currently on Zosyn that should cover for her pneumonia.  No wheezing heard on exam so we will hold off on adding steroids. Daughter reports history of COPD from her smoking history but no PFT found in chart.  Patient is not using any inhalers at home. -Patient is net +8 L.  IV Lasix 40 mg twice daily.  Kidney function is good. -Weaning trial as able.  She could not tolerate SBT yesterday.  Repeat chest x-ray today. -VAP protocol -Wean down sedation.  Will try to come down on propofol today  Septic shock from necrotizing soft tissue infection of b/l buttock and perineum. E. coli  bacteremia Underwent debridement on 3/21 and 3/22.  We can try extubation if no more debridement surgery is needed.  Her fever curve is trending upward but WBC is trending down.  -Continue IV Zosyn (day 6) -Appreciate surgery team's assistance -Wound care per surgery  Hyperglycemia due to acute illness - SSI  Anemia of critical illness and chronic disease. She may has a component of iron deficiency anemia from her surgeries as well.  So far her hemoglobin is stable around 9's. -Will check iron storage when she is no longer bacteremic -Follow CBC daily  Depression. -hold outpt citalopram -continue seroquel qhs  Vomiting. - hold tube feeds in AM of 3/24 and try to resume in afternoon - prn zofran  Best Practice (right click and "Reselect all SmartList Selections" daily)   Diet/type: tubefeeds DVT prophylaxis: prophylactic heparin  GI prophylaxis: PPI Lines: Central line and Arterial Line Foley:  Yes, and it is still needed Code Status:  full code Last date of multidisciplinary goals of care discussion [family updated at bedside 3/25]  Labs   CBC: Recent Labs  Lab 02/20/23 2238 02/21/23 0306 02/21/23 0400 02/21/23 0839 02/21/23 1611 02/23/23 0159 02/24/23 0400 02/25/23 0317 02/25/23 0323  WBC 13.5*  --  15.9*  --   --  8.0 11.5* 9.4  --   HGB 10.4*   < > 10.5*   < > 11.2* 9.1* 9.8* 9.0* 9.2*  HCT 32.5*   < > 31.6*   < > 33.0* 26.6* 31.3* 28.8* 27.0*  MCV 88.8  --  88.3  --   --  86.9 91.3 90.9  --   PLT 151  --  150  --   --  155 178 211  --    < > = values in this interval not displayed.     Basic Metabolic Panel: Recent Labs  Lab 02/21/23 0400 02/21/23 0839 02/21/23 1612 02/22/23 0312 02/22/23 0720 02/22/23 1539 02/23/23 0159 02/24/23 0400 02/25/23 0317 02/25/23 0323  NA 131*   < >  --   --  130*  --  132* 134* 135 137  K 3.7   < >  --   --  3.6  --  3.3* 4.1 3.6 3.8  CL 98  --   --   --  100  --  101 104 101  --   CO2 21*  --   --   --  19*  --   20* 20* 22  --   GLUCOSE 169*  --   --   --  145*  --  102* 119* 124*  --   BUN 36*  --   --   --  48*  --  42* 40* 34*  --   CREATININE 1.52*  --   --   --  1.79*  --  1.21* 1.15* 0.94  --   CALCIUM 7.8*  --   --   --  7.3*  --  7.7* 7.6* 7.8*  --   MG 1.7  --  2.6* 2.5*  --  2.7* 2.4  --  2.4  --   PHOS 3.6  --  4.1 4.4 4.3 3.6 2.6  --  3.5  --    < > = values in this interval not displayed.    GFR: Estimated Creatinine Clearance: 68 mL/min (by C-G formula based on SCr of 0.94 mg/dL). Recent Labs  Lab 02/20/23 1823 02/20/23 2238 02/21/23 0400 02/21/23 0846 02/23/23 0159 02/24/23 0400 02/25/23 0317  PROCALCITON  --  7.42  --   --   --   --   --   WBC  --  13.5* 15.9*  --  8.0 11.5* 9.4  LATICACIDVEN 1.7  --  0.8 1.1  --   --   --      Liver Function Tests: Recent Labs  Lab 02/20/23 1806 02/22/23 0720  AST 20  --   ALT 13  --   ALKPHOS 87  --   BILITOT 1.0  --   PROT 5.6*  --   ALBUMIN 2.5* 2.2*    No results for input(s): "LIPASE", "AMYLASE" in the last 168 hours. No results for input(s): "AMMONIA" in the last 168 hours.  ABG    Component Value Date/Time   PHART 7.329 (L) 02/25/2023 0323   PCO2ART 44.7 02/25/2023 0323   PO2ART 96 02/25/2023 0323   HCO3 23.3 02/25/2023 0323   TCO2 25 02/25/2023 0323   ACIDBASEDEF 2.0 02/25/2023 0323   O2SAT 96 02/25/2023 0323     Coagulation Profile: Recent Labs  Lab 02/20/23 1823  INR 1.3*     Cardiac Enzymes: No results for input(s): "CKTOTAL", "CKMB", "CKMBINDEX", "TROPONINI" in the last 168 hours.  HbA1C: Hgb A1c MFr Bld  Date/Time Value Ref Range Status  01/21/2023 10:08 AM 5.8 (H) 4.8 - 5.6 % Final    Comment:             Prediabetes: 5.7 - 6.4          Diabetes: >6.4          Glycemic control for adults with diabetes: <7.0   07/26/2022 09:51 AM 5.9 (H) 4.8 - 5.6 % Final    Comment:             Prediabetes: 5.7 - 6.4          Diabetes: >6.4          Glycemic control for adults with diabetes: <7.0      CBG: Recent Labs  Lab 02/24/23 1506 02/24/23 1903 02/24/23 2305 02/25/23 0305 02/25/23 0715  GLUCAP [place], DO Internal Medicine Residency My pager: [phone]

## 2023-02-25 NOTE — Progress Notes (Signed)
3 Days Post-Op  Subjective: CC: Seen with RN. Patient intubated and sedated.  Tmax 100.3. Off pressors this am. WBC normalized at 9.4.   No further vomiting. Tolerating TF's.  Flexiseal in place with liquid stool in bag.  Foley in place with good uop.   Objective: Vital signs in last 24 hours: Temp:  [98.5 F (36.9 C)-100.3 F (37.9 C)] 100.1 F (37.8 C) (03/25 0716) Pulse Rate:  [59-81] 77 (03/25 0749) Resp:  [18-27] 27 (03/25 0749) BP: (136)/(56) 136/56 (03/25 0749) SpO2:  [93 %-99 %] 95 % (03/25 0749) Arterial Line BP: (97-141)/(43-91) 129/53 (03/25 0600) FiO2 (%):  [50 %-60 %] 50 % (03/25 0749) Weight:  [111.2 kg] 111.2 kg (03/25 0500) Last BM Date : 02/24/23  Intake/Output from previous day: 03/24 0701 - 03/25 0700 In: 2839.1 [I.V.:1166.1; NG/GT:1120; IV Piggyback:512.9] Out: 2865 [Urine:2825; Stool:40] Intake/Output this shift: No intake/output data recorded.  PE: Gen:  on the vent HEENT: Cortrak in place Pulm:  mechanically ventilated Abd: Soft, ND, no rigidity or guarding.  GU: Seen with RN. Large bilateral buttock wounds pictured below. Flexiseal in place with liquid stool in bag.  R buttock wound with area of tunneling ~6cm on the medial aspect where I was able to bluntly break loculations and drain bloody purulent fluid. Remainder of the wound appears clean with mixture of granulation tissue, adipose tissue and fibrinous tissue.  L buttock wound without any significant tunneling/undermining. No purulent drainage, induration or bleeding. There is more dense and somewhat darker fibrinous tissue on the cephalad portion of the wound. The remainder is with mixture of granulation tissue, adipose tissue and fibrinous tissue.    Lab Results:  Recent Labs    02/24/23 0400 02/25/23 0317 02/25/23 0323  WBC 11.5* 9.4  --   HGB 9.8* 9.0* 9.2*  HCT 31.3* 28.8* 27.0*  PLT 178 211  --    BMET Recent Labs    02/24/23 0400 02/25/23 0317 02/25/23 0323  NA 134*  135 137  K 4.1 3.6 3.8  CL 104 101  --   CO2 20* 22  --   GLUCOSE 119* 124*  --   BUN 40* 34*  --   CREATININE 1.15* 0.94  --   CALCIUM 7.6* 7.8*  --    PT/INR No results for input(s): "LABPROT", "INR" in the last 72 hours. CMP     Component Value Date/Time   NA 137 02/25/2023 0323   NA 141 01/21/2023 1008   K 3.8 02/25/2023 0323   CL 101 02/25/2023 0317   CO2 22 02/25/2023 0317   GLUCOSE 124 (H) 02/25/2023 0317   BUN 34 (H) 02/25/2023 0317   BUN 14 01/21/2023 1008   CREATININE 0.94 02/25/2023 0317   CREATININE 0.87 04/11/2021 0838   CALCIUM 7.8 (L) 02/25/2023 0317   PROT 5.6 (L) 02/20/2023 1806   PROT 7.1 01/21/2023 1008   ALBUMIN 2.2 (L) 02/22/2023 0720   ALBUMIN 4.8 01/21/2023 1008   AST 20 02/20/2023 1806   ALT 13 02/20/2023 1806   ALKPHOS 87 02/20/2023 1806   BILITOT 1.0 02/20/2023 1806   BILITOT 0.3 01/21/2023 1008   GFRNONAA >60 02/25/2023 0317   GFRNONAA 70 04/11/2021 0838   GFRAA 82 04/11/2021 0838   Lipase  No results found for: "LIPASE"  Studies/Results: DG Chest Port 1 View  Result Date: 02/24/2023 CLINICAL DATA:  Respiratory failure. EXAM: PORTABLE CHEST 1 VIEW COMPARISON:  02/21/2023 FINDINGS: Low volume film. Endotracheal tube tip is approximately 3.8 cm  above the base of the carina. A feeding tube passes into the stomach although the distal tip position is not included on the film. Right IJ central line tip overlies the distal SVC level. Interval development of right base atelectasis or infiltrate with similar left base collapse/consolidation. Probable tiny bilateral pleural effusions. Telemetry leads overlie the chest. IMPRESSION: 1. Interval development of right base atelectasis or infiltrate with similar left base collapse/consolidation. 2. Probable tiny bilateral pleural effusions. Electronically Signed   By: Misty Stanley M.D.   On: 02/24/2023 06:46    Anti-infectives: Anti-infectives (From admission, onward)    Start     Dose/Rate Route Frequency  Ordered Stop   02/21/23 1000  linezolid (ZYVOX) IVPB 600 mg  Status:  Discontinued        600 mg 300 mL/hr over 60 Minutes Intravenous Every 12 hours 02/21/23 0902 02/24/23 0817   02/21/23 1000  piperacillin-tazobactam (ZOSYN) IVPB 3.375 g        3.375 g 12.5 mL/hr over 240 Minutes Intravenous Every 8 hours 02/21/23 0902     02/21/23 0600  clindamycin (CLEOCIN) IVPB 600 mg  Status:  Discontinued        600 mg 100 mL/hr over 30 Minutes Intravenous Every 8 hours 02/21/23 0237 02/21/23 0902   02/20/23 2300  ceFEPIme (MAXIPIME) 2 g in sodium chloride 0.9 % 100 mL IVPB  Status:  Discontinued        2 g 200 mL/hr over 30 Minutes Intravenous Every 12 hours 02/20/23 2222 02/21/23 0902   02/20/23 2230  clindamycin (CLEOCIN) IVPB 600 mg        600 mg 100 mL/hr over 30 Minutes Intravenous  Once 02/20/23 2216 02/20/23 2345   02/20/23 1942  vancomycin variable dose per unstable renal function (pharmacist dosing)  Status:  Discontinued         Does not apply See admin instructions 02/20/23 1942 02/21/23 0902   02/20/23 1845  vancomycin (VANCOREADY) IVPB 2000 mg/400 mL        2,000 mg 200 mL/hr over 120 Minutes Intravenous  Once 02/20/23 1838 02/20/23 2153   02/20/23 1830  vancomycin (VANCOCIN) IVPB 1000 mg/200 mL premix  Status:  Discontinued        1,000 mg 200 mL/hr over 60 Minutes Intravenous  Once 02/20/23 1824 02/20/23 1838   02/20/23 1830  cefTRIAXone (ROCEPHIN) 2 g in sodium chloride 0.9 % 100 mL IVPB        2 g 200 mL/hr over 30 Minutes Intravenous  Once 02/20/23 1824 02/20/23 2000        Assessment/Plan -POD#5 s/p Excisional debridement NSTI bilateral buttocks 3/20 Dr. Donne Hazel -POD#3 s/p Debridement of soft tissue infection bilateral gluteal regions and perineum  3/22 Dr. Georgette Dover - Cx with E. Cli, Bacteroides Fragilis and Beta Lactamase positive. Cont abx. With patient also having E. Coli Bacteremia will defer narrowing to primary  - No indication for further surgical debridement -  Will start Hydrotherapy.  - Cont BID WTD dressing changes.  - Cont Flexiseal. May need to consider diverting colostomy if stool contamination of the wounds becomes an issue. Do not think she needs this currently.    ID - Zosyn FEN - NPO, TF VTE - SCDs, sq heparin Foley - in place   Septic shock - off pressors Acute Hypoxic Respiratory failure secondary to flu (tested positive outpatient) and LLL pneumonia - weaning vent Tobacco use AKI Inflammatory bowel disease HTN HLD Anxiety  I reviewed nursing notes, hospitalist notes, last 50  h vitals and pain scores, last 48 h intake and output, last 24 h labs and trends, and last 24 h imaging results.    LOS: 5 days    Jillyn Ledger , [facility] 02/25/2023, 8:21 AM Please see Amion for pager number during day hours 7:00am-4:30pm

## 2023-02-25 NOTE — Progress Notes (Incomplete)
Wendy Crawford, D.O.  Wendy Crawford, Wendy Crawford Specializing in Clinical Bariatric Medicine Office located at: [address], [place]  [address]     Initial Bariatric Medicine Consultation Visit  Dear Wendy Crawford, Wendy Mcgee, MD or Wendy Frizzle, MD,   Thank you for referring Wendy Crawford to our clinic today for evaluation.  We performed a consultation to discuss her options for treatment and educate the patient on her disease state.  The following note includes my evaluation and treatment recommendations.   Please Wendy Crawford not hesitate to reach out to me directly if you have any further concerns.    Assessment and Plan:   No orders of the defined types were placed in this encounter.   There are no discontinued medications.   No orders of the defined types were placed in this encounter.    ***  1) Fatigue Assessment: Condition is {EWCOURSE:24262}.  Wendy Crawford {DOES_DOES [id number] feel that her weight is causing her energy to be lower than it should be. Fatigue may be related to obesity, depression or many other causes. she does not appear to have any red flag symptoms and this appears to most likely related to her current lifestyle habits and dietary intake.  Plan: Labs will be ordered and reviewed with her at their next office visit in two weeks In the meanwhile, Wendy Crawford will focus on self care including making healthy food choices by following their meal plan, improving sleep quality and focusing on stress reduction.  Once we are assured she is on an appropriate meal plan, we will start discussing exercise to increase cardiovascular fitness levels.    2) Shortness of breath on exertion Assessment: Condition is {EWCOURSE:24262}. Wendy Crawford {DOES_DOES [id number] feel that she gets out of breath more easily than she used to when she exercises and seems to be worsening over time with weight gain.  This {HAS HAS CG:8705835 gotten worse recently. Wendy Crawford denies shortness of breath at rest or  orthopnea. Wendy Crawford's shortness of breath appears to be obesity related and exercise induced, as they Wendy Crawford not appear to have any "red flag" symptoms/ concerns today.  Also, this condition appears to be related to a state of poor cardiovascular conditioning   Plan: Patient agreed to work on weight loss at this time.  As Wendy Crawford progresses through our weight loss program, we will gradually increase exercise as tolerated to treat her current condition.   If Wendy Crawford follows our recommendations and loses 5-10% of their weight without improvement of her shortness of breath or if at any time, symptoms become more concerning, they agree to urgently follow up with their PCP/ specialist for further consideration/ evaluation.   Wendy Crawford verbalizes agreement with this plan.    ***   TREATMENT PLAN FOR OBESITY: *** Assessment: Condition is {EWCOURSE:24262}. {BiometricData (Optional):29179} Fat mass has {DID:29233} by ***lb. Muscle mass has {DID:29233} by ***lb. Total body water has {DID:29233} by ***lb.   Plan: Wendy Crawford will work on healthier eating habits and try their best to follow the {mealplan:29239} best they can.  ***   Recommended Dietary Goals Wendy Crawford is currently in the action stage of change. As such, her goal is to continue weight management plan. She has agreed to {MWMwtlossportion/plan2:23431}.  Behavioral Intervention Additional resources provided today: {weightresources:29185} Evidence-based interventions for health behavior change were utilized today including the discussion of self monitoring techniques, problem-solving barriers and SMART goal setting techniques.   Regarding patient's less desirable eating habits and patterns, we employed the technique of small changes.  Pt will specifically work on: *** for next visit.    Recommended Physical Activity Goals Wendy Crawford has been advised to work up to 150 minutes of moderate intensity aerobic activity a week and strengthening exercises 2-3 times per  week for cardiovascular health, weight loss maintenance and preservation of muscle mass.  She has agreed to continue their current level of activity  FOLLOW UP: Follow up in 2 weeks. She was informed of the importance of frequent follow up visits to maximize her success with intensive lifestyle modifications for her multiple health conditions. Wendy Crawford is aware that we will review all of her lab results at our next visit.  She is aware that if anything is critical/ life threatening with the results, we will be contacting her via [place] prior to the office visit to discuss management.     Chief Complaint:   OBESITY Wendy Crawford (MR# [id number]) is a 67 y.o. female who presents for evaluation and treatment of obesity and related comorbidities. Current BMI is There is no height or weight on file to calculate BMI. Wendy Crawford has been struggling with her weight for many years and has been unsuccessful in either losing weight, maintaining weight loss, or reaching her healthy weight goal.  Wendy Ryder Wendy Crawford works as a ***. Patient {relationshipstatus:29186} and {HAS/DOES NOT QU:4564275 children. She lives with ***.  Wendy Crawford is currently in the action stage of change and ready to dedicate time achieving and maintaining a healthier weight. Wendy Crawford is interested in becoming our patient and working on intensive lifestyle modifications including (but not limited to) diet and exercise for weight loss.  Wendy P Wendy Crawford habits were reviewed today and are as follows: {MWM WT HABITS:23461}.  ***  Depression Screen Her Food and Mood (modified PHQ-9) score was ***.  Sleep habits Her ESS score is ***.  Wendy Crawford {Actions; denies/reports/admits to:19208} daytime somnolence and {Actions; denies/reports/admits to:19208} waking up still tired. Patient has a history of symptoms of {OSA Sx:17850}. Wendy Crawford generally gets {numbers (fuzzy):14653} hours of sleep per night, and states that she has {sleep  quality:17851}. Snoring {is/are not:32546} present. Apneic episodes {is/are not:32546} present.    Subjective:   This is the patient's first visit at [facility].  The patient's NEW PATIENT PACKET that they filled out prior to today's office visit was reviewed at length and information from that paperwork was included within the following office visit note.    Included in the packet: current and past health history, medications, allergies, ROS, gynecologic history (women only), surgical history, family history, social history, weight history, weight loss surgery history (for those that have had weight loss surgery), nutritional evaluation, mood and food questionnaire along with a depression screening (PHQ9) on all patients, an Epworth questionnaire, sleep habits questionnaire, patient life and health improvement goals questionnaire. These will all be scanned into the patient's chart under the "media" tab.   Review of Systems: Please refer to new patient packet scanned into media. Pertinent positives were addressed with patient today.   Objective:   PHYSICAL EXAM: There were no vitals taken for this visit. There is no height or weight on file to calculate BMI. General: Well Developed, well nourished, and in no acute distress.  HEENT: Normocephalic, atraumatic Skin: Warm and dry, cap RF less 2 sec, good turgor Chest:  Normal excursion, shape, no gross abn Respiratory: speaking in full sentences, no conversational dyspnea NeuroM-Sk: Ambulates w/o assistance, moves * 4 Psych: A and O *3, insight good, mood-full  EKG: Normal sinus rhythm, rate ***bpm.  Indirect Calorimeter completed today shows a VO2 of *** and a REE of ***.  Her calculated basal metabolic rate is *** thus her measured basal metabolic rate is {DESC; [id number] than expected.  Anthropometric Measurements Weight: 245 lb 2.4 oz (111.2 kg) BMI (Calculated): 46.34   No data recorded No data  recorded  DIAGNOSTIC DATA REVIEWED:  BMET    Component Value Date/Time   NA 137 02/25/2023 0323   NA 141 01/21/2023 1008   K 3.8 02/25/2023 0323   CL 101 02/25/2023 0317   CO2 22 02/25/2023 0317   GLUCOSE 124 (H) 02/25/2023 0317   BUN 34 (H) 02/25/2023 0317   BUN 14 01/21/2023 1008   CREATININE 0.94 02/25/2023 0317   CREATININE 0.87 04/11/2021 0838   CALCIUM 7.8 (L) 02/25/2023 0317   GFRNONAA >60 02/25/2023 0317   GFRNONAA 70 04/11/2021 0838   GFRAA 82 04/11/2021 0838   Lab Results  Component Value Date   HGBA1C 5.8 (H) 01/21/2023   HGBA1C 5.9 (H) 03/20/2022   Lab Results  Component Value Date   INSULIN 11.0 01/21/2023   INSULIN 13.9 03/20/2022   Lab Results  Component Value Date   TSH 4.150 01/21/2023   CBC    Component Value Date/Time   WBC 9.4 02/25/2023 0317   RBC 3.17 (L) 02/25/2023 0317   HGB 9.2 (L) 02/25/2023 0323   HGB 14.1 01/21/2023 1008   HCT 27.0 (L) 02/25/2023 0323   HCT 42.6 01/21/2023 1008   PLT 211 02/25/2023 0317   PLT 228 01/21/2023 1008   MCV 90.9 02/25/2023 0317   MCV 88 01/21/2023 1008   MCH 28.4 02/25/2023 0317   MCHC 31.3 02/25/2023 0317   RDW 15.8 (H) 02/25/2023 0317   RDW 13.5 01/21/2023 1008   Iron Studies No results found for: "IRON", "TIBC", "FERRITIN", "IRONPCTSAT" Lipid Panel     Component Value Date/Time   CHOL 187 01/21/2023 1008   TRIG 214 (H) 02/25/2023 0317   HDL 68 01/21/2023 1008   CHOLHDL 2.8 01/21/2023 1008   CHOLHDL 3.1 04/11/2021 0838   LDLCALC 97 01/21/2023 1008   LDLCALC 106 (H) 04/11/2021 0838   LDLDIRECT 143 (H) 03/15/2020 1643   Hepatic Function Panel     Component Value Date/Time   PROT 5.6 (L) 02/20/2023 1806   PROT 7.1 01/21/2023 1008   ALBUMIN 2.2 (L) 02/22/2023 0720   ALBUMIN 4.8 01/21/2023 1008   AST 20 02/20/2023 1806   ALT 13 02/20/2023 1806   ALKPHOS 87 02/20/2023 1806   BILITOT 1.0 02/20/2023 1806   BILITOT 0.3 01/21/2023 1008   BILIDIR 0.1 06/05/2007 1051      Component Value  Date/Time   TSH 4.150 01/21/2023 1008   Nutritional Lab Results  Component Value Date   VD25OH 50.7 01/21/2023   VD25OH 48.0 07/26/2022   VD25OH 28.0 (L) 03/20/2022    Attestation Statements:   Reviewed by clinician on day of visit: allergies, medications, problem list, medical history, surgical history, family history, social history, and previous encounter notes.  During the visit, I independently reviewed the patient's EKG, bioimpedance scale results, and indirect calorimeter results. I used this information to tailor a meal plan for the patient that will help [place] to lose weight and will improve her obesity-related conditions going forward.  I performed a medically necessary appropriate examination and/or evaluation. I discussed the assessment and treatment plan with the patient. The patient was provided an opportunity to ask questions  and all were answered. The patient agreed with the plan and demonstrated an understanding of the instructions. Labs were ordered today (unless patient declined them) and will be reviewed with the patient at our next visit unless more critical results need to be addressed immediately. Clinical information was updated and documented in the EMR.  Time spent on visit including pre-visit chart review and post-visit care was estimated to be 60 *** minutes.    I,Special Puri,acting as a Education administrator for [employer], Wendy Crawford.,have documented all relevant documentation on the behalf of Mellody Dance, Wendy Crawford,as directed by  Mellody Dance, Wendy Crawford while in the presence of Mellody Dance, Wendy Crawford.   I, Mellody Dance, Wendy Crawford, have reviewed all documentation for this visit. The documentation on 02/25/23 for the exam, diagnosis, procedures, and orders are all accurate and complete.

## 2023-02-25 NOTE — Progress Notes (Signed)
Patient admitted for Septic shock from necrotizing soft tissue infection of b/l buttock and perineum. Patient currently intubated.  TOC following.

## 2023-02-25 NOTE — Progress Notes (Signed)
[facility] ADULT ICU REPLACEMENT PROTOCOL   The patient does apply for the [facility] Adult ICU Electrolyte Replacment Protocol based on the criteria listed below:   1.Exclusion criteria: TCTS, ECMO, Dialysis, and Myasthenia Gravis patients 2. Is GFR >/= 30 ml/min? Yes.    Patient's GFR today is >60 3. Is SCr </= 2? Yes.   Patient's SCr is 0.94 mg/dL 4. Did SCr increase >/= 0.5 in 24 hours? No. 5.Pt's weight >40kg  Yes.   6. Abnormal electrolyte(s): K  7. Electrolytes replaced per protocol 8.  Call MD STAT for K+ </= 2.5, Phos </= 1, or Mag </= 1 Physician:  Sherlene Shams [facility] 02/25/2023 4:24 AM

## 2023-02-26 ENCOUNTER — Inpatient Hospital Stay (HOSPITAL_COMMUNITY): Payer: Medicare Other

## 2023-02-26 ENCOUNTER — Telehealth: Payer: Self-pay | Admitting: Family Medicine

## 2023-02-26 DIAGNOSIS — R6521 Severe sepsis with septic shock: Secondary | ICD-10-CM | POA: Diagnosis not present

## 2023-02-26 DIAGNOSIS — A419 Sepsis, unspecified organism: Secondary | ICD-10-CM | POA: Diagnosis not present

## 2023-02-26 LAB — CULTURE, BLOOD (ROUTINE X 2)
Culture: NO GROWTH
Special Requests: ADEQUATE

## 2023-02-26 LAB — CBC
HCT: 29.5 % — ABNORMAL LOW (ref 36.0–46.0)
Hemoglobin: 9.4 g/dL — ABNORMAL LOW (ref 12.0–15.0)
MCH: 28.8 pg (ref 26.0–34.0)
MCHC: 31.9 g/dL (ref 30.0–36.0)
MCV: 90.5 fL (ref 80.0–100.0)
Platelets: 282 10*3/uL (ref 150–400)
RBC: 3.26 MIL/uL — ABNORMAL LOW (ref 3.87–5.11)
RDW: 15.5 % (ref 11.5–15.5)
WBC: 11 10*3/uL — ABNORMAL HIGH (ref 4.0–10.5)
nRBC: 0 % (ref 0.0–0.2)

## 2023-02-26 LAB — GLUCOSE, CAPILLARY
Glucose-Capillary: 138 mg/dL — ABNORMAL HIGH (ref 70–99)
Glucose-Capillary: 116 mg/dL — ABNORMAL HIGH (ref 70–99)
Glucose-Capillary: 129 mg/dL — ABNORMAL HIGH (ref 70–99)
Glucose-Capillary: 160 mg/dL — ABNORMAL HIGH (ref 70–99)
Glucose-Capillary: 145 mg/dL — ABNORMAL HIGH (ref 70–99)
Glucose-Capillary: 130 mg/dL — ABNORMAL HIGH (ref 70–99)

## 2023-02-26 LAB — BASIC METABOLIC PANEL
Anion gap: 11 (ref 5–15)
BUN: 29 mg/dL — ABNORMAL HIGH (ref 8–23)
CO2: 26 mmol/L (ref 22–32)
Calcium: 8.1 mg/dL — ABNORMAL LOW (ref 8.9–10.3)
Chloride: 102 mmol/L (ref 98–111)
Creatinine, Ser: 0.83 mg/dL (ref 0.44–1.00)
GFR, Estimated: >60 mL/min (ref >60)
Glucose, Bld: 134 mg/dL — ABNORMAL HIGH (ref 70–99)
Potassium: 3.4 mmol/L — ABNORMAL LOW (ref 3.5–5.1)
Sodium: 139 mmol/L (ref 135–145)

## 2023-02-26 MED ORDER — IPRATROPIUM-ALBUTEROL 0.5-2.5 (3) MG/3ML IN SOLN
3.0000 mL | RESPIRATORY_TRACT | Status: AC
  Administered 2023-02-26 – 2023-02-27 (×8): 3 mL via RESPIRATORY_TRACT
  Filled 2023-02-26 (×8): qty 3

## 2023-02-26 MED ORDER — PREDNISONE 20 MG PO TABS: 40.0000 mg | ORAL_TABLET | Freq: Every day | ORAL | Status: DC

## 2023-02-26 MED ORDER — SODIUM CHLORIDE 3 % IN NEBU
4.0000 mL | INHALATION_SOLUTION | Freq: Every day | RESPIRATORY_TRACT | Status: AC
  Administered 2023-02-26 – 2023-02-28 (×3): 4 mL via RESPIRATORY_TRACT
  Filled 2023-02-26 (×3): qty 4

## 2023-02-26 MED ORDER — POTASSIUM CHLORIDE 20 MEQ PO PACK
40.0000 meq | PACK | Freq: Once | ORAL | Status: AC
  Administered 2023-02-26: 40 meq
  Filled 2023-02-26: qty 2

## 2023-02-26 MED ORDER — BUDESONIDE 0.25 MG/2ML IN SUSP
0.2500 mg | Freq: Two times a day (BID) | RESPIRATORY_TRACT | Status: DC
  Administered 2023-02-26 – 2023-03-01 (×7): 0.25 mg via RESPIRATORY_TRACT
  Filled 2023-02-26 (×7): qty 2

## 2023-02-26 MED ORDER — JUVEN PO PACK
1.0000 | PACK | Freq: Two times a day (BID) | ORAL | Status: DC
  Administered 2023-02-26 – 2023-02-28 (×6): 1
  Filled 2023-02-26 (×6): qty 1

## 2023-02-26 MED ORDER — OXYCODONE HCL 5 MG PO TABS
10.0000 mg | ORAL_TABLET | Freq: Three times a day (TID) | ORAL | Status: DC
  Administered 2023-02-26 – 2023-02-27 (×3): 10 mg
  Filled 2023-02-26 (×3): qty 2

## 2023-02-26 MED ORDER — FUROSEMIDE 10 MG/ML IJ SOLN
40.0000 mg | Freq: Two times a day (BID) | INTRAMUSCULAR | Status: AC
  Administered 2023-02-26 (×2): 40 mg via INTRAVENOUS
  Filled 2023-02-26 (×2): qty 4

## 2023-02-26 MED ORDER — OXYCODONE HCL 5 MG PO TABS
5.0000 mg | ORAL_TABLET | ORAL | Status: DC
  Administered 2023-02-26: 5 mg
  Filled 2023-02-26: qty 1

## 2023-02-26 MED ORDER — PREDNISONE 20 MG PO TABS
40.0000 mg | ORAL_TABLET | Freq: Every day | ORAL | Status: DC
  Administered 2023-02-26 – 2023-02-28 (×3): 40 mg
  Filled 2023-02-26 (×3): qty 2

## 2023-02-26 MED ORDER — GUAIFENESIN 100 MG/5ML PO LIQD
5.0000 mL | Freq: Four times a day (QID) | ORAL | Status: AC
  Administered 2023-02-26 – 2023-02-28 (×12): 5 mL
  Filled 2023-02-26 (×13): qty 5

## 2023-02-26 NOTE — Progress Notes (Signed)
[facility] ADULT ICU REPLACEMENT PROTOCOL   The patient does apply for the [facility] Adult ICU Electrolyte Replacment Protocol based on the criteria listed below:   1.Exclusion criteria: TCTS, ECMO, Dialysis, and Myasthenia Gravis patients 2. Is GFR >/= 30 ml/min? Yes.    Patient's GFR today is >60 3. Is SCr </= 2? Yes.   Patient's SCr is 0.83 mg/dL 4. Did SCr increase >/= 0.5 in 24 hours? No. 5.Pt's weight >40kg  Yes.   6. Abnormal electrolyte(s): potassium 3.4  7. Electrolytes replaced per protocol 8.  Call MD STAT for K+ </= 2.5, Phos </= 1, or Mag </= 1 Physician:  protocol  Darlys Gales 02/26/2023 4:05 AM

## 2023-02-26 NOTE — Telephone Encounter (Signed)
Called patient to schedule Medicare Annual Wellness Visit (AWV). Unable to reach patient.  Patient shows to currently be admitted  Last date of AWV: due 02/01/2023 awvi per [place]  Please schedule an appointment at any time with Wendy Crawford, [facility] .  If any questions, please contact me at [phone].  Thank you,  [name],  [place] Program Direct Dial ??[phone]

## 2023-02-26 NOTE — Progress Notes (Incomplete)
Attending note: I have seen and examined the patient. History, labs and imaging reviewed.  Not following commands. Failed PSV weans    Blood pressure (!) 103/52, pulse 77, temperature 98.9 F (37.2 C), temperature source Axillary, resp. rate (!) 22, weight 107.5 kg, SpO2 90 %. Gen:      No acute distress HEENT:  EOMI, sclera anicteric Neck:     No masses; no thyromegaly Lungs:    Clear to auscultation bilaterally; normal respiratory effort*** CV:         Regular rate and rhythm; no murmurs Abd:      + bowel sounds; soft, non-tender; no palpable masses, no distension Ext:    No edema; adequate peripheral perfusion Skin:      Warm and dry; no rash Neuro: alert and oriented x 3 Psych: normal mood and affect   Labs/Imaging personally reviewed, significant for   Assessment/plan: Acute resp failure May have COPD at at baseline as she has smoking history Adding nebs, steroids  Soft tissue necrotizing infection s/p debridement Wound care Start hydrotherapy E coli bacteremia and E. coli + Bacteroides on wound culture- On Zosyn day 7 Follow final cultures Start oxy for pain.    LLL PNA Abx as above Lasix for diuresis  SBTs as tolerated  The patient is critically ill with multiple organ systems failure and requires high complexity decision making for assessment and support, frequent evaluation and titration of therapies, application of advanced monitoring technologies and extensive interpretation of multiple databases.  Critical care time - 35 mins. This represents my time independent of the NPs time taking care of the pt.  Marshell Garfinkel MD [facility] 02/26/2023, 9:35 AM

## 2023-02-26 NOTE — Progress Notes (Signed)
Nutrition Follow-up  DOCUMENTATION CODES:  Obesity unspecified  INTERVENTION:  Continue tube feeding via gastric cortrak: Vital AF at 60 ml/h (1440 ml per day) Provides 1728 kcal, 108 gm protein, 1168 ml free water daily TF+propofol = 2211 kcal/d 1 packet Juven BID, each packet provides 95 calories, 2.5 grams of protein (collagen), and 9.8 grams of carbohydrate (3 grams sugar); also contains 7 grams of L-arginine and L-glutamine, 300 mg vitamin C, 15 mg vitamin E, 1.2 mcg vitamin B-12, 9.5 mg zinc, 200 mg calcium, and 1.5 g  Calcium Beta-hydroxy-Beta-methylbutyrate to support wound healing  NUTRITION DIAGNOSIS:   Increased nutrient needs related to wound healing as evidenced by estimated needs. - remains applicable  GOAL:   Patient will meet greater than or equal to 90% of their needs - progressing, being met with TF at goal  MONITOR:   Vent status, Labs, TF tolerance, Skin  REASON FOR ASSESSMENT:   Ventilator, Consult Enteral/tube feeding initiation and management  ASSESSMENT:   Pt with hx of ulcerative colitis, IBS, and tobacco use presented to ED from urgent care with dizziness and hypotension. Septic on arrival due to wounds to her buttocks. Imaging showed soft tissue air and induration in the posterior right perineum, right perirectal and gluteus. Pt was taken to OR urgently for debridement.  3/21 - Op, debridement of the bilateral buttocks, returned to ICU on vent  3/22 - Op, debridement, cortrak placed (distal stomach)  Patient is currently intubated on ventilator support. Surgery not planning for any further surgical debridements at this time but do note that pt may need a diverting colostomy if stool contamination becomes an issue in the future. Currently well controlled with flexiseal. Undergoing wound hydrotherapy with PT and the time of assessment.   Pt with poor weaning on ventilator this AM. Attending notes propofol is going to attempt to be weaned.  TF  infusing at goal and well tolerated after having some issues with vomiting over the weekend. Added juven today to support wound healing and increased micronutrient needs.   MV: 9.9 L/min Temp (24hrs), Avg:99.1 F (37.3 C), Min:98.6 F (37 C), Max:99.8 F (37.7 C)  Propofol: 18.29 ml/hr (483 kcal/d)   Intake/Output Summary (Last 24 hours) at 02/26/2023 0817 Last data filed at 02/26/2023 0700 Gross per 24 hour  Intake 2452.62 ml  Output 5030 ml  Net -2577.38 ml  Net IO Since Admission: 5,553.66 mL [02/26/23 0817]  Nutritionally Relevant Medications: Scheduled Meds:  insulin aspart  0-9 Units Subcutaneous Q4H   pantoprazole IV  40 mg Intravenous Q24H   Continuous Infusions:  feeding supplement (VITAL AF 1.2 CAL) 60 mL/hr at 02/26/23 0700   norepinephrine (LEVOPHED) Adult infusion 1 mcg/min (02/26/23 0700)   piperacillin-tazobactam (ZOSYN)  IV Stopped (02/26/23 [id number])   propofol (DIPRIVAN) infusion 30 mcg/kg/min (02/26/23 0700)   PRN Meds: docusate, ondansetron, polyethylene glycol  Labs Reviewed: K 3.4 BUN 29 CBG ranges from 112-144 mg/dL over the last 24 hours Triglycerides 214 (3/25)   NUTRITION - FOCUSED PHYSICAL EXAM: Flowsheet Row Most Recent Value  Orbital Region No depletion  Upper Arm Region No depletion  Thoracic and Lumbar Region No depletion  Buccal Region No depletion  Temple Region Mild depletion  Clavicle Bone Region No depletion  Clavicle and Acromion Bone Region No depletion  Scapular Bone Region No depletion  Dorsal Hand No depletion  Patellar Region No depletion  Anterior Thigh Region No depletion  Posterior Calf Region No depletion  Edema (RD Assessment) Mild  [hands, BLE]  Hair Reviewed  Eyes Reviewed  Mouth Reviewed  Skin Reviewed  Nails Reviewed    Diet Order:   Diet Order             Diet NPO time specified  Diet effective now                   EDUCATION NEEDS:   Not appropriate for education at this time  Skin:  Skin  Assessment: Reviewed RN Assessment (wounds to the bilateral buttocks, s/p debridement)  Last BM:  3/25 - via fecal management system 521mL of output documented in the last 24 hours  Height:   Ht Readings from Last 1 Encounters:  02/20/23 5\' 1"  (1.549 m)    Weight:   Wt Readings from Last 1 Encounters:  02/26/23 107.5 kg    Ideal Body Weight:  47.7 kg  BMI:  Body mass index is 44.78 kg/m.  Estimated Nutritional Needs:  Kcal:  1700-1900 kcal/d Protein:  95-120g/d Fluid:  >1.8L/d    Ranell Patrick, RD, LDN Clinical Dietitian RD pager # available in [facility]  After hours/weekend pager # available in [facility]

## 2023-02-26 NOTE — Progress Notes (Signed)
Pharmacy Electrolyte Replacement  Recent Labs:  Recent Labs    02/25/23 0317 02/25/23 0323 02/26/23 0237  K 3.6   < > 3.4*  MG 2.4  --   --   PHOS 3.5  --   --   CREATININE 0.94  --  0.83   < > = values in this interval not displayed.    Low Critical Values (K </= 2.5, Phos </= 1, Mg </= 1) Present: K = 3.4  MD Contacted: Dr. Vaughan Browner  Plan: Initiate additional dose of potassium 22mEq per tube x1 to the dose of potassium 40mg  per tube x1 given earlier today. Pt will receive Lasix 40 IV q12 x2 today.

## 2023-02-26 NOTE — Progress Notes (Signed)
Physical Therapy Wound Evaluation and Treatment Patient Details  Name: Wendy Crawford MRN: [id number] Date of Birth: November 26, 1956  Today's Date: 02/26/2023 Time: 1102-1150 Time Calculation (min): 48 min  Subjective  Subjective Assessment Subjective: pt intubated and sedated Patient and Family Stated Goals: sister present and wants wounds to heal Date of Onset: 02/20/23 Prior Treatments: surgical debridement; dressing changes  Pain Score:   no indications of pain  Wound Assessment  Wound / Incision (Open or Dehisced) 02/20/23 Incision - Open Buttocks Right Rt buttock (Active)  Wound Image   02/26/23 1202  Dressing Type Gauze (Comment);ABD;Normal saline moist dressing;Barrier Film (skin prep) 02/26/23 1202  Dressing Changed Changed 02/26/23 1202  Dressing Status Clean, Dry, Intact 02/26/23 1202  Dressing Change Frequency Twice a day 02/26/23 1202  Site / Wound Assessment Black;Brown;Granulation tissue;Pink;Yellow 02/26/23 1202  % Wound base Red or Granulating 70% 02/26/23 1202  % Wound base Yellow/Fibrinous Exudate 25% 02/26/23 1202  % Wound base Black/Eschar 5% 02/26/23 1202  Peri-wound Assessment Other (Comment) 02/26/23 1202  Wound Length (cm) 5.5 cm 02/26/23 1202  Wound Width (cm) 9.8 cm 02/26/23 1202  Wound Depth (cm) 3.3 cm 02/26/23 1202  Wound Volume (cm^3) 177.87 cm^3 02/26/23 1202  Wound Surface Area (cm^2) 53.9 cm^2 02/26/23 1202  Tunneling (cm) medial upper tunnel 10.0 cm; medial lower tunnel 8.9 cm 02/26/23 1202  Margins Unattached edges (unapproximated) 02/26/23 1202  Drainage Amount Moderate 02/26/23 1202  Drainage Description Serosanguineous 02/26/23 1202  Non-staged Wound Description Full thickness 02/26/23 1202  Treatment Debridement (Selective);Irrigation;Packing (Saline gauze) 02/26/23 1202     Wound / Incision (Open or Dehisced) 02/20/23 Incision - Open Buttocks Left Left buttock (Active)  Dressing Type Gauze (Comment);Normal saline moist dressing;ABD;Barrier  Film (skin prep) 02/26/23 1202  Dressing Changed Changed 02/26/23 1202  Dressing Status Clean, Dry, Intact 02/26/23 1202  Dressing Change Frequency Twice a day 02/26/23 1202  Site / Wound Assessment Black;Brown;Granulation tissue;Pink;Pale;Yellow 02/26/23 1202  % Wound base Red or Granulating 60% 02/26/23 1202  % Wound base Yellow/Fibrinous Exudate 30% 02/26/23 1202  % Wound base Black/Eschar 10% 02/26/23 1202  Peri-wound Assessment Other (Comment) 02/26/23 1202  Wound Length (cm) 11 cm 02/26/23 1202  Wound Width (cm) 9.5 cm 02/26/23 1202  Wound Depth (cm) 3.5 cm 02/26/23 1202  Wound Volume (cm^3) 365.75 cm^3 02/26/23 1202  Wound Surface Area (cm^2) 104.5 cm^2 02/26/23 1202  Undermining (cm) 11:00 to 1:00 4.0cm 02/26/23 1202  Margins Unattached edges (unapproximated) 02/26/23 1202  Drainage Amount Moderate 02/26/23 1202  Drainage Description Serosanguineous 02/26/23 1202  Non-staged Wound Description Full thickness 02/26/23 1202  Treatment Debridement (Selective);Irrigation;Packing (Saline gauze) 02/26/23 1202   Selective Debridement (non-excisional) Selective Debridement (non-excisional) - Location: bil buttocks Selective Debridement (non-excisional) - Tools Used: Forceps, Scalpel, Scissors Selective Debridement (non-excisional) - Tissue Removed: black and brown necrotic tissue    Wound Assessment and Plan  Wound Therapy - Assess/Plan/Recommendations Wound Therapy - Clinical Statement: Patient admitted with soreness of bil buttocks and developed necrotizing fascitis requiring multiple surgical debridements. She is currently sedated on the ventilator and appeared to have no pain during procedure. She will benefit from hydrotherapy to reduce the bioburden and promote healing. Wound Therapy - Functional Problem List: sedated and unable to change positions Factors Delaying/Impairing Wound Healing: Infection - systemic/local, Immobility, Other (comment) (wound location--near  rectum) Hydrotherapy Plan: Debridement, Dressing change, Patient/family education, Pulsatile lavage with suction, Other (comment) (irrigation) Wound Therapy - Frequency: 3X / week Wound Therapy - Follow Up Recommendations: dressing changes by RN  Wound Therapy Goals- Improve the function of patient's integumentary system by progressing the wound(s) through the phases of wound healing (inflammation - proliferation - remodeling) by: Wound Therapy Goals - Improve the function of patient's integumentary system by progressing the wound(s) through the phases of wound healing by: Decrease Necrotic Tissue to: 20% Decrease Necrotic Tissue - Progress: Goal set today Increase Granulation Tissue to: 80% (including healthy fat tissue) Increase Granulation Tissue - Progress: Goal set today Improve Drainage Characteristics: Min Improve Drainage Characteristics - Progress: Goal set today Goals/treatment plan/discharge plan were made with and agreed upon by patient/family: No, Patient unable to participate in goals/treatment/discharge plan and family unavailable Time For Goal Achievement: 7 days Wound Therapy - Potential for Goals: Good  Goals will be updated until maximal potential achieved or discharge criteria met.  Discharge criteria: when goals achieved, discharge from hospital, MD decision/surgical intervention, no progress towards goals, refusal/missing three consecutive treatments without notification or medical reason.  GP     Charges PT Wound Care Charges $Wound Debridement up to 20 cm: < or equal to 20 cm $ Wound Debridement each add'l 20 sqcm: 2 $PT Hydrotherapy Visit: 1 Visit      [place]  Office [phone]   Rexanne Mano 02/26/2023, 12:38 PM

## 2023-02-26 NOTE — Progress Notes (Addendum)
NAME:  Wendy Crawford, MRN:  [id number], DOB:  11/02/1956, LOS: 6 ADMISSION DATE:  02/20/2023, CONSULTATION DATE:  02/20/23 REFERRING MD:  EDP, CHIEF COMPLAINT:  hypotension   History of Present Illness:  Melisssa Crawford is a 67 y.o. F with PMH significant for obesity, depression, headaches, osteoarthritis and tobacco use who presents to the ED with dizziness and low blood pressure.  She began feeling poorly about one week ago when she noticed a sore on her buttock, this has grown and redness and pain has spread to both buttocks with some purulent drainage.   She then developed a cough and was diagnosed with the flu on 3/17 and has felt short of breath with nausea and vomiting and very little po intake for the last few days.  She denies chest pain, leg edema or pain.      In the ED, she was given 3.5L IVF and broad spectrum abx.  CXR consistent with LLL pneumonia.  Labs showed lactic acid of 1.7, Na 127, creatinine 1.95, WBC 115, platelets 147, WBC 11.5.   She required levophed for hypotension despite IVF, therefore PCCM consulted   Pertinent  Medical History  Osteoarthritis Cluster headaches Depression Hyperlipidemia IBS Psoriasis  Significant Hospital Events: Including procedures, antibiotic start and stop dates in addition to other pertinent events   3/20 presented with cellulitis, PNA and septic shock, PCCM admit, started on levophed  3/21 intubated, CVC line placed 3/21 debridement of wounds 3/22 debridement of wounds  Interim History / Subjective:  Patient is seen at bedside with her daughter.  Patient could not tolerate CPAP mode this morning.  She appears uncomfortable. Objective   Blood pressure (!) 103/52, pulse 84, temperature 98.9 F (37.2 C), temperature source Axillary, resp. rate (!) 32, weight 107.5 kg, SpO2 93 %.    Vent Mode: PRVC FiO2 (%):  [50 %] 50 % Set Rate:  [25 bmp] 25 bmp Vt Set:  [380 mL] 380 mL PEEP:  [5 cmH20] 5 cmH20 Plateau Pressure:  [11 cmH20-22  cmH20] 16 cmH20   Intake/Output Summary (Last 24 hours) at 02/26/2023 0738 Last data filed at 02/26/2023 0700 Gross per 24 hour  Intake 2452.62 ml  Output 5030 ml  Net -2577.38 ml    Filed Weights   02/24/23 0359 02/25/23 0500 02/26/23 0247  Weight: 111.5 kg 111.2 kg 107.5 kg    Examination: Physical Exam Constitutional:      Comments: Sedated.  Appears restless.    HENT:     Head: Normocephalic.  Eyes:     General:        Right eye: No discharge.        Left eye: No discharge.     Conjunctiva/sclera: Conjunctivae normal.  Cardiovascular:     Rate and Rhythm: Normal rate and regular rhythm.     Comments: Trace edema bilateral lower and upper extremities Pulmonary:     Effort: Pulmonary effort is normal.     Comments: Expiratory wheezing and rhonchi heard bilaterally.  Abdominal:     General: Bowel sounds are normal. There is no distension.     Palpations: Abdomen is soft.  Skin:    General: Skin is warm.  Neurological:     Comments: Sedated      Resolved Hospital Problem list   AKI from ATN 2nd to sepsis, Anion gap metabolic acidosis with lactic acidosis  Assessment & Plan:  Acute hypoxic respiratory failure from LLL pneumonia, and pulmonary edema, requiring mechanical ventilator Possible COPD exacerbation Daughter  reports history of COPD from her smoking history but no PFT found in chart.  Patient has been refusing inhalers at home.  Daughters state that she has dyspnea with exertion, pursed lips breathing and wheezing at home. -At Pulmicort, DuoNebs q4h, prednisone and pulmonary hygiene. -Continue IV Lasix 40 mg twice daily.  Good urine output overnight.  She still net + 5 L. -Weaning trial as able.   -VAP protocol -Wean down sedation.  Will try to come down on propofol.  Septic shock from necrotizing soft tissue infection of b/l buttock and perineum. E. coli bacteremia Underwent debridement on 3/21 and 3/22.  Remains afebrile. -Continue IV Zosyn (day 7).   Plan to treat for 10 days after her I&D. -Appreciate surgery team's assistance -Wound care per surgery -She may need a diverting colostomy.  Hyperglycemia due to acute illness - SSI  Anemia of critical illness and chronic disease. She may has a component of iron deficiency anemia from her surgeries as well.  So far her hemoglobin is stable around 9's. -Will check iron storage when she is no longer bacteremic -Follow CBC daily  Depression. -resume citalopram  Best Practice (right click and "Reselect all SmartList Selections" daily)   Diet/type: tubefeeds DVT prophylaxis: prophylactic heparin  GI prophylaxis: PPI Lines: Central line and Arterial Line Foley:  Yes, and it is still needed Code Status:  full code Last date of multidisciplinary goals of care discussion [family updated at bedside 3/25]  Labs   CBC: Recent Labs  Lab 02/21/23 0400 02/21/23 0839 02/23/23 0159 02/24/23 0400 02/25/23 0317 02/25/23 0323 02/26/23 0237  WBC 15.9*  --  8.0 11.5* 9.4  --  11.0*  HGB 10.5*   < > 9.1* 9.8* 9.0* 9.2* 9.4*  HCT 31.6*   < > 26.6* 31.3* 28.8* 27.0* 29.5*  MCV 88.3  --  86.9 91.3 90.9  --  90.5  PLT 150  --  155 178 211  --  282   < > = values in this interval not displayed.     Basic Metabolic Panel: Recent Labs  Lab 02/21/23 1612 02/22/23 0312 02/22/23 0720 02/22/23 1539 02/23/23 0159 02/24/23 0400 02/25/23 0317 02/25/23 0323 02/26/23 0237  NA  --   --  130*  --  132* 134* 135 137 139  K  --   --  3.6  --  3.3* 4.1 3.6 3.8 3.4*  CL  --   --  100  --  101 104 101  --  102  CO2  --   --  19*  --  20* 20* 22  --  26  GLUCOSE  --   --  145*  --  102* 119* 124*  --  134*  BUN  --   --  48*  --  42* 40* 34*  --  29*  CREATININE  --   --  1.79*  --  1.21* 1.15* 0.94  --  0.83  CALCIUM  --   --  7.3*  --  7.7* 7.6* 7.8*  --  8.1*  MG 2.6* 2.5*  --  2.7* 2.4  --  2.4  --   --   PHOS 4.1 4.4 4.3 3.6 2.6  --  3.5  --   --     GFR: Estimated Creatinine Clearance:  75.5 mL/min (by C-G formula based on SCr of 0.83 mg/dL). Recent Labs  Lab 02/20/23 1823 02/20/23 2238 02/21/23 0400 02/21/23 [id number] 02/23/23 0159 02/24/23 0400 02/25/23 [id number] 02/26/23 [id number]  PROCALCITON  --  7.42  --   --   --   --   --   --   WBC  --  13.5* 15.9*  --  8.0 11.5* 9.4 11.0*  LATICACIDVEN 1.7  --  0.8 1.1  --   --   --   --      Liver Function Tests: Recent Labs  Lab 02/20/23 1806 02/22/23 0720  AST 20  --   ALT 13  --   ALKPHOS 87  --   BILITOT 1.0  --   PROT 5.6*  --   ALBUMIN 2.5* 2.2*    No results for input(s): "LIPASE", "AMYLASE" in the last 168 hours. No results for input(s): "AMMONIA" in the last 168 hours.  ABG    Component Value Date/Time   PHART 7.329 (L) 02/25/2023 0323   PCO2ART 44.7 02/25/2023 0323   PO2ART 96 02/25/2023 0323   HCO3 23.3 02/25/2023 0323   TCO2 25 02/25/2023 0323   ACIDBASEDEF 2.0 02/25/2023 0323   O2SAT 96 02/25/2023 0323     Coagulation Profile: Recent Labs  Lab 02/20/23 1823  INR 1.3*     Cardiac Enzymes: No results for input(s): "CKTOTAL", "CKMB", "CKMBINDEX", "TROPONINI" in the last 168 hours.  HbA1C: Hgb A1c MFr Bld  Date/Time Value Ref Range Status  01/21/2023 10:08 AM 5.8 (H) 4.8 - 5.6 % Final    Comment:             Prediabetes: 5.7 - 6.4          Diabetes: >6.4          Glycemic control for adults with diabetes: <7.0   07/26/2022 09:51 AM 5.9 (H) 4.8 - 5.6 % Final    Comment:             Prediabetes: 5.7 - 6.4          Diabetes: >6.4          Glycemic control for adults with diabetes: <7.0     CBG: Recent Labs  Lab 02/25/23 1504 02/25/23 1921 02/25/23 2320 02/26/23 0335 02/26/23 0714  GLUCAP 144* Roma, DO [facility] My pager: [phone]   ----------------------------------------------------------  Attending note: I have seen and examined the patient. History, labs and imaging reviewed.  67 year old with history of obesity,  depression, headache Admitted with necrotizing soft tissue infection of buttocks and perineum, PNA. Off pressors today Remains on vent Not following commands. Failed PSV weans   Blood pressure (!) 103/52, pulse 77, temperature 98.9 F (37.2 C), temperature source Axillary, resp. rate (!) 22, weight 107.5 kg, SpO2 90 %. Gen:      No acute distress HEENT:  EOMI, sclera anicteric Neck:     No masses; no thyromegaly, ETT Lungs:    Bilateral expiratory wheeze CV:         Regular rate and rhythm; no murmurs Abd:      + bowel sounds; soft, non-tender; no palpable masses, no distension Ext:    No edema; adequate peripheral perfusion Skin:      Warm and dry; no rash Neuro: alert and oriented x 3 Psych: normal mood and affect   Labs/imaging reviewed Significant for potassium 3.4, BUN/creatinine 29/0.83 AST 20, ALT 13  Assessment/plan: Acute resp failure, wheezing today May have COPD at at baseline as she has smoking history Adding nebs, steroids  Soft tissue necrotizing infection s/p debridement Wound care Start hydrotherapy E coli bacteremia  and E. coli + Bacteroides on wound culture- On Zosyn day 7 Follow final cultures Start oxy for pain.    LLL PNA Abx as above Lasix for diuresis  SBTs as tolerated  The patient is critically ill with multiple organ systems failure and requires high complexity decision making for assessment and support, frequent evaluation and titration of therapies, application of advanced monitoring technologies and extensive interpretation of multiple databases.  Critical care time - 35 mins. This represents my time independent of the NPs time taking care of the pt.  Marshell Garfinkel MD [facility] 02/26/2023, 9:35 AM

## 2023-02-26 NOTE — Progress Notes (Signed)
[facility] Progress Note  4 Days Post-Op  Subjective: CC-  Remains on the vent. Levo stopped again this morning. WBC 11, afebrile Flexiseal in place with liquid stool in pouch  Objective: Vital signs in last 24 hours: Temp:  [98.6 F (37 C)-99.8 F (37.7 C)] 98.9 F (37.2 C) (03/26 0717) Pulse Rate:  [58-93] 77 (03/26 0830) Resp:  [13-34] 22 (03/26 0830) BP: (103)/(52) 103/52 (03/25 1745) SpO2:  [88 %-97 %] 90 % (03/26 0830) Arterial Line BP: (80-152)/(43-91) 106/53 (03/26 0830) FiO2 (%):  [50 %] 50 % (03/26 0824) Weight:  [107.5 kg] 107.5 kg (03/26 0247) Last BM Date : 02/26/23  Intake/Output from previous day: 03/25 0701 - 03/26 0700 In: 2452.6 [I.V.:792.4; NG/GT:1500; IV Piggyback:160.2] Out: 5030 [Urine:4450; Stool:580] Intake/Output this shift: Total I/O In: 102.9 [I.V.:42.9; NG/GT:60] Out: -   PE: Gen:  on the vent HEENT: Cortrak in place Pulm:  mechanically ventilated GU: Seen with RN. Large bilateral buttock wounds pictured below. Flexiseal in place with liquid brown stool in bag.  R buttock wound with area of tunneling on the medial aspect towards base of labia, small amount of purulent drainage persists. No cellulitis or induration noted externally in the perineum. Remainder of the wound appears clean with mixture of granulation and adipose tissue  L buttock wound without any significant tunneling/ undermining. No purulent drainage or induration. Wound base with mixture of granulation and adipose tissue   Lab Results:  Recent Labs    02/25/23 0317 02/25/23 0323 02/26/23 0237  WBC 9.4  --  11.0*  HGB 9.0* 9.2* 9.4*  HCT 28.8* 27.0* 29.5*  PLT 211  --  282   BMET Recent Labs    02/25/23 0317 02/25/23 0323 02/26/23 0237  NA 135 137 139  K 3.6 3.8 3.4*  CL 101  --  102  CO2 22  --  26  GLUCOSE 124*  --  134*  BUN 34*  --  29*  CREATININE 0.94  --  0.83  CALCIUM 7.8*  --  8.1*   PT/INR No results for input(s): "LABPROT", "INR"  in the last 72 hours. CMP     Component Value Date/Time   NA 139 02/26/2023 0237   NA 141 01/21/2023 1008   K 3.4 (L) 02/26/2023 0237   CL 102 02/26/2023 0237   CO2 26 02/26/2023 0237   GLUCOSE 134 (H) 02/26/2023 0237   BUN 29 (H) 02/26/2023 0237   BUN 14 01/21/2023 1008   CREATININE 0.83 02/26/2023 0237   CREATININE 0.87 04/11/2021 0838   CALCIUM 8.1 (L) 02/26/2023 0237   PROT 5.6 (L) 02/20/2023 1806   PROT 7.1 01/21/2023 1008   ALBUMIN 2.2 (L) 02/22/2023 0720   ALBUMIN 4.8 01/21/2023 1008   AST 20 02/20/2023 1806   ALT 13 02/20/2023 1806   ALKPHOS 87 02/20/2023 1806   BILITOT 1.0 02/20/2023 1806   BILITOT 0.3 01/21/2023 1008   GFRNONAA >60 02/26/2023 0237   GFRNONAA 70 04/11/2021 0838   GFRAA 82 04/11/2021 0838   Lipase  No results found for: "LIPASE"     Studies/Results: DG CHEST PORT 1 VIEW  Result Date: 02/26/2023 CLINICAL DATA:  Shortness of breath EXAM: PORTABLE CHEST 1 VIEW COMPARISON:  Chest radiograph 1 day prior FINDINGS: The right internal jugular catheter terminates in the lower SVC. There is a possible kink within the catheter in the lower neck which is new from the prior study. The enteric catheter tip is below the diaphragm off the field  of view. The cardiomediastinal silhouette is stable. There is a small left pleural effusion with associated left basilar opacity, unchanged. There is no significant right effusion. There is vascular congestion with prominence of the pulmonary arteries but no definite overt pulmonary edema. There is no pneumothorax. Overall, aeration is not significantly changed. The bones are stable. IMPRESSION: 1. No significant interval change in lung aeration with small left pleural effusion and adjacent left basilar opacity. 2. Prominent pulmonary vasculature without definite overt pulmonary edema. 3. Possible kink in the right internal jugular venous catheter in the lower neck. Correlate with catheter function. Electronically Signed   By:  Valetta Mole M.D.   On: 02/26/2023 08:40   DG CHEST PORT 1 VIEW  Result Date: 02/25/2023 CLINICAL DATA:  Shortness of breath. EXAM: PORTABLE CHEST 1 VIEW COMPARISON:  February 24, 2023. FINDINGS: Stable cardiomediastinal silhouette. Feeding tube is seen entering stomach. Right internal jugular catheter is unchanged. Stable bibasilar atelectasis or infiltrates are noted with associated pleural effusions. Bony thorax is unremarkable. IMPRESSION: Stable bibasilar opacities as described above. Electronically Signed   By: Marijo Conception M.D.   On: 02/25/2023 09:39    Anti-infectives: Anti-infectives (From admission, onward)    Start     Dose/Rate Route Frequency Ordered Stop   02/21/23 1000  linezolid (ZYVOX) IVPB 600 mg  Status:  Discontinued        600 mg 300 mL/hr over 60 Minutes Intravenous Every 12 hours 02/21/23 0902 02/24/23 0817   02/21/23 1000  piperacillin-tazobactam (ZOSYN) IVPB 3.375 g        3.375 g 12.5 mL/hr over 240 Minutes Intravenous Every 8 hours 02/21/23 0902 03/04/23 0751   02/21/23 0600  clindamycin (CLEOCIN) IVPB 600 mg  Status:  Discontinued        600 mg 100 mL/hr over 30 Minutes Intravenous Every 8 hours 02/21/23 0237 02/21/23 0902   02/20/23 2300  ceFEPIme (MAXIPIME) 2 g in sodium chloride 0.9 % 100 mL IVPB  Status:  Discontinued        2 g 200 mL/hr over 30 Minutes Intravenous Every 12 hours 02/20/23 2222 02/21/23 0902   02/20/23 2230  clindamycin (CLEOCIN) IVPB 600 mg        600 mg 100 mL/hr over 30 Minutes Intravenous  Once 02/20/23 2216 02/20/23 2345   02/20/23 1942  vancomycin variable dose per unstable renal function (pharmacist dosing)  Status:  Discontinued         Does not apply See admin instructions 02/20/23 1942 02/21/23 0902   02/20/23 1845  vancomycin (VANCOREADY) IVPB 2000 mg/400 mL        2,000 mg 200 mL/hr over 120 Minutes Intravenous  Once 02/20/23 1838 02/20/23 2153   02/20/23 1830  vancomycin (VANCOCIN) IVPB 1000 mg/200 mL premix  Status:   Discontinued        1,000 mg 200 mL/hr over 60 Minutes Intravenous  Once 02/20/23 1824 02/20/23 1838   02/20/23 1830  cefTRIAXone (ROCEPHIN) 2 g in sodium chloride 0.9 % 100 mL IVPB        2 g 200 mL/hr over 30 Minutes Intravenous  Once 02/20/23 1824 02/20/23 2000        Assessment/Plan -POD#6 s/p Excisional debridement NSTI bilateral buttocks 3/20 Dr. Donne Hazel -POD#4 s/p Debridement of soft tissue infection bilateral gluteal regions and perineum  3/22 Dr. Georgette Dover - Cx with E. Coli, Bacteroides Fragilis and Beta Lactamase positive. Cont abx. With patient also having E. Coli Bacteremia will defer narrowing to primary  -  No indication for further surgical debridement - Hydrotherapy ordered 3/25 - Cont BID WTD dressing changes - reviewed wound care with RN and importance of packing the tunneled portion at the medial aspect of the right buttock wound  - Cont Flexiseal. May need to consider diverting colostomy if stool contamination of the wounds becomes an issue. Do not think she needs this currently.    ID - Zosyn FEN - NPO, TF VTE - SCDs, sq heparin Foley - in place   Septic shock  Acute Hypoxic Respiratory failure secondary to flu (tested positive outpatient) and LLL pneumonia - weaning vent Tobacco use AKI Inflammatory bowel disease HTN HLD Anxiety    LOS: 6 days    Wellington [place], [facility] 02/26/2023, 9:38 AM Please see Amion for pager number during day hours 7:00am-4:30pm

## 2023-02-27 DIAGNOSIS — A419 Sepsis, unspecified organism: Secondary | ICD-10-CM | POA: Diagnosis not present

## 2023-02-27 DIAGNOSIS — Z515 Encounter for palliative care: Secondary | ICD-10-CM

## 2023-02-27 DIAGNOSIS — J9600 Acute respiratory failure, unspecified whether with hypoxia or hypercapnia: Secondary | ICD-10-CM

## 2023-02-27 DIAGNOSIS — R6521 Severe sepsis with septic shock: Secondary | ICD-10-CM | POA: Diagnosis not present

## 2023-02-27 DIAGNOSIS — Z7189 Other specified counseling: Secondary | ICD-10-CM | POA: Diagnosis not present

## 2023-02-27 DIAGNOSIS — M726 Necrotizing fasciitis: Secondary | ICD-10-CM | POA: Diagnosis not present

## 2023-02-27 DIAGNOSIS — Z66 Do not resuscitate: Secondary | ICD-10-CM

## 2023-02-27 LAB — CBC
HCT: 28.3 % — ABNORMAL LOW (ref 36.0–46.0)
Hemoglobin: 9.1 g/dL — ABNORMAL LOW (ref 12.0–15.0)
MCH: 29.2 pg (ref 26.0–34.0)
MCHC: 32.2 g/dL (ref 30.0–36.0)
MCV: 90.7 fL (ref 80.0–100.0)
Platelets: 241 10*3/uL (ref 150–400)
RBC: 3.12 MIL/uL — ABNORMAL LOW (ref 3.87–5.11)
RDW: 15.5 % (ref 11.5–15.5)
WBC: 9.2 10*3/uL (ref 4.0–10.5)
nRBC: 0 % (ref 0.0–0.2)

## 2023-02-27 LAB — BASIC METABOLIC PANEL
Anion gap: 10 (ref 5–15)
BUN: 42 mg/dL — ABNORMAL HIGH (ref 8–23)
CO2: 30 mmol/L (ref 22–32)
Calcium: 8.4 mg/dL — ABNORMAL LOW (ref 8.9–10.3)
Chloride: 104 mmol/L (ref 98–111)
Creatinine, Ser: 0.79 mg/dL (ref 0.44–1.00)
GFR, Estimated: >60 mL/min (ref >60)
Glucose, Bld: 117 mg/dL — ABNORMAL HIGH (ref 70–99)
Potassium: 4 mmol/L (ref 3.5–5.1)
Sodium: 144 mmol/L (ref 135–145)

## 2023-02-27 LAB — GLUCOSE, CAPILLARY
Glucose-Capillary: 117 mg/dL — ABNORMAL HIGH (ref 70–99)
Glucose-Capillary: 99 mg/dL (ref 70–99)
Glucose-Capillary: 146 mg/dL — ABNORMAL HIGH (ref 70–99)
Glucose-Capillary: 158 mg/dL — ABNORMAL HIGH (ref 70–99)
Glucose-Capillary: 154 mg/dL — ABNORMAL HIGH (ref 70–99)
Glucose-Capillary: 150 mg/dL — ABNORMAL HIGH (ref 70–99)

## 2023-02-27 MED ORDER — OXYCODONE HCL 5 MG PO TABS
15.0000 mg | ORAL_TABLET | Freq: Three times a day (TID) | ORAL | Status: DC
  Administered 2023-02-27 – 2023-02-28 (×3): 15 mg
  Filled 2023-02-27 (×3): qty 3

## 2023-02-27 MED ORDER — POLYETHYLENE GLYCOL 3350 17 G PO PACK
17.0000 g | PACK | Freq: Every day | ORAL | Status: DC
  Administered 2023-02-27 – 2023-02-28 (×2): 17 g
  Filled 2023-02-27 (×2): qty 1

## 2023-02-27 MED ORDER — FREE WATER
200.0000 mL | Freq: Four times a day (QID) | Status: DC
  Administered 2023-02-27 – 2023-02-28 (×5): 200 mL

## 2023-02-27 MED ORDER — DOCUSATE SODIUM 50 MG/5ML PO LIQD
100.0000 mg | Freq: Two times a day (BID) | ORAL | Status: DC
  Administered 2023-02-27 – 2023-02-28 (×4): 100 mg
  Filled 2023-02-27 (×4): qty 10

## 2023-02-27 MED ORDER — WHITE PETROLATUM EX OINT
TOPICAL_OINTMENT | CUTANEOUS | Status: DC | PRN
  Filled 2023-02-27: qty 28.35

## 2023-02-27 MED ORDER — FUROSEMIDE 10 MG/ML IJ SOLN
40.0000 mg | Freq: Once | INTRAMUSCULAR | Status: AC
  Administered 2023-02-27: 40 mg via INTRAVENOUS
  Filled 2023-02-27: qty 4

## 2023-02-27 MED ORDER — DEXMEDETOMIDINE HCL IN NACL 400 MCG/100ML IV SOLN
0.0000 ug/kg/h | INTRAVENOUS | Status: DC
  Administered 2023-02-27: 1 ug/kg/h via INTRAVENOUS
  Administered 2023-02-27: 0.8 ug/kg/h via INTRAVENOUS
  Administered 2023-02-27: 0.4 ug/kg/h via INTRAVENOUS
  Administered 2023-02-27 (×2): 1 ug/kg/h via INTRAVENOUS
  Administered 2023-02-28: 0.6 ug/kg/h via INTRAVENOUS
  Administered 2023-02-28: 1 ug/kg/h via INTRAVENOUS
  Administered 2023-02-28: 1.2 ug/kg/h via INTRAVENOUS
  Administered 2023-02-28: 0.8 ug/kg/h via INTRAVENOUS
  Administered 2023-02-28: 1.2 ug/kg/h via INTRAVENOUS
  Administered 2023-02-28: 0.9 ug/kg/h via INTRAVENOUS
  Filled 2023-02-27: qty 100
  Filled 2023-02-27: qty 200
  Filled 2023-02-27 (×2): qty 100
  Filled 2023-02-27: qty 200
  Filled 2023-02-27 (×5): qty 100

## 2023-02-27 NOTE — Consult Note (Signed)
Consultation Note Date: 02/27/2023   Patient Name: Wendy Crawford  DOB: 09/06/1956  MRN: [id number]  Age / Sex: 67 y.o., female  PCP: Wendy Frizzle, MD Referring Physician: Marshell Garfinkel, MD  Reason for Consultation: Establishing goals of care  HPI/Patient Profile: 67 y.o. female  with past medical history of obesity, depression, headaches, osteoarthritis and tobacco use  admitted on 02/20/2023 with dizziness and low blood pressure.  Patient diagnosed with septic shock from necrotizing soft tissue infection of buttock and perineum.  Found to have E. coli bacteremia.  Patient was acute hypoxic respiratory failure requiring mechanical ventilator.  PMT consulted to discuss goals of care.  Clinical Assessment and Goals of Care: I have reviewed medical records including EPIC notes, labs and imaging, received report from Dr. Alfonse Crawford, assessed the patient and then met with patient's spouse, daughter, and sister to discuss diagnosis prognosis, GOC, EOL wishes, disposition and options.  I introduced Palliative Medicine as specialized medical care for people living with serious illness. It focuses on providing relief from the symptoms and stress of a serious illness. The goal is to improve quality of life for both the patient and the family.  Family shares at baseline patient was functional.  She did not need assistance with daily cares.  Her mobility was somewhat limited by knee pain and she would use a cane.  They share patient was not on oxygen at baseline but she did have to pause to catch her breath at times.   We discussed patient's current illness and what it means in the larger context of patient's on-going co-morbidities.  Natural disease trajectory and expectations at EOL were discussed.  We reviewed her ongoing dependence on the ventilator.  Discussed how the medical team will continue to attempt to wean patient from the ventilator.  We discussed  her wounds and concerns about wound care moving forward.  I attempted to elicit values and goals of care important to the patient.  Family shares that quality of life is important to patient she would not want to live dependent on medical equipment such as ventilator.  Family brings up trach as this was discussed with him previously.  All questions and concerns were addressed about trach and PEG.  Family does not have a definitive decision but shared that they do not feel like they would pursue trach and PEG if it came to that.  We discussed what it may look like to transition to focus on her comfort.  We discussed CODE STATUS.  We discussed continuing all current measures however if Wendy Crawford's heart were to stop to not put her through something like CPR.  Family agrees this is appropriate.  Discussed with family the importance of continued conversation with family and the medical providers regarding overall plan of care and treatment options, ensuring decisions are within the context of the patients values and GOCs.    Questions and concerns were addressed. The family was encouraged to call with questions or concerns.  Primary Decision Maker NEXT OF KIN -spouse Wendy Crawford    SUMMARY OF RECOMMENDATIONS   -Continue current measures -family hopeful for improvement and patient able to tolerate weaning from ventilator -No definitive decision made but family does report that if patient is unable to be weaned from ventilator they do not think they would want to pursue trach -No CPR, continue current measures but with cardiac arrest allow natural death      Primary Diagnoses: Present on Admission:  Septic shock ([place])  I have reviewed the medical record, interviewed the patient and family, and examined the patient. The following aspects are pertinent.  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)     Colitis    ulcerative   Depression    Headache(784.0)    most severe for 2 months,cluster   Headaches, cluster    02 and medication   High cholesterol    Irritable bowel    Joint pain    Knee pain    Obesity    Osteoarthritis    Pneumonia YRS AGO   Primary snoring 06/17/2014   Psoriasis    Ulcerative colitis ([place])    Social History   Socioeconomic History   Marital status: Married    Spouse name: Wendy Crawford   Number of children: 0   Years of education: 12   Highest education level: Not on file  Occupational History   Occupation: self employed   Occupation: Stay At Home  Tobacco Use   Smoking status: Every Day    Packs/day: 0.50    Years: 42.00    Additional pack years: 0.00    Total pack years: 21.00    Types: Cigarettes   Smokeless tobacco: Never  Vaping Use   Vaping Use: Never used  Substance and Sexual Activity   Alcohol use: No    Alcohol/week: 0.0 standard drinks of alcohol   Drug use: No   Sexual activity: Not on file  Other Topics Concern   Not on file  Social History Narrative   Patient is married Wendy Crawford) and lives at home with her husband.   Patient is working full-time.   Patient has a high school education.   Patient is right-handed.   Patient does not drinks any caffeine.   Social Determinants of Health   Financial Resource Strain: Not on file  Food Insecurity: Not on file  Transportation Needs: Not on file  Physical Activity: Not on file  Stress: Not on file  Social Connections: Not on file   Family History  Problem Relation Age of Onset   Hypertension Mother    High Cholesterol Mother    Stroke Mother    Diabetes Father    Thyroid disease Father    Obesity Father    Diabetes Sister    Diabetes Brother    Heart disease Maternal Grandmother    Heart disease Maternal Grandfather    Colon cancer Neg Hx    Scheduled Meds:  budesonide (PULMICORT) nebulizer solution  0.25 mg Nebulization BID   Chlorhexidine Gluconate Cloth  6 each  Topical Daily   citalopram  20 mg Per Tube Daily   docusate  100 mg Per Tube BID   enoxaparin (LOVENOX) injection  50 mg Subcutaneous Q24H   free water  200 mL Per Tube Q6H   guaiFENesin  5 mL Per Tube Q6H   insulin aspart  0-9 Units Subcutaneous Q4H   nutrition supplement (JUVEN)  1 packet Per Tube BID BM   mouth rinse  15 mL Mouth Rinse Q2H   oxyCODONE  15 mg Per Tube Q8H   pantoprazole (PROTONIX) IV  40 mg Intravenous Q24H   polyethylene glycol  17 g Per Tube Daily   predniSONE  40 mg Per Tube Daily   sodium chloride flush  10-40 mL Intracatheter Q12H   sodium chloride HYPERTONIC  4 mL Nebulization Daily   Continuous Infusions:  sodium chloride  dexmedetomidine (PRECEDEX) IV infusion 1 mcg/kg/hr (02/27/23 1400)   feeding supplement (VITAL AF 1.2 CAL) 60 mL/hr at 02/27/23 1400   fentaNYL infusion INTRAVENOUS 150 mcg/hr (02/27/23 1400)   piperacillin-tazobactam (ZOSYN)  IV Stopped (02/27/23 1328)   PRN Meds:.sodium chloride, acetaminophen, fentaNYL, midazolam, ondansetron (ZOFRAN) IV, mouth rinse, sodium chloride flush Allergies  Allergen Reactions   Sulfa Antibiotics Itching   Sulfonamide Derivatives Itching   Review of Systems  Unable to perform ROS: Intubated    Physical Exam Constitutional:      Comments: Sedated, intubated Intermittently follows commands  Cardiovascular:     Rate and Rhythm: Normal rate and regular rhythm.  Skin:    General: Skin is warm and dry.     Vital Signs: BP 120/67   Pulse 60   Temp 99.6 F (37.6 C) (Axillary)   Resp 12   Wt 107.5 kg   SpO2 92%   BMI 44.78 kg/m  Pain Scale: CPOT   Pain Score: 0-No pain   SpO2: SpO2: 92 % O2 Device:SpO2: 92 % O2 Flow Rate: .   IO: Intake/output summary:  Intake/Output Summary (Last 24 hours) at 02/27/2023 1427 Last data filed at 02/27/2023 1400 Gross per 24 hour  Intake 3230.1 ml  Output 3175 ml  Net 55.1 ml    LBM: Last BM Date : 02/27/23 Baseline Weight: Weight: 106.7 kg Most  recent weight: Weight: 107.5 kg       *Please note that this is a verbal dictation therefore any spelling or grammatical errors are due to the "[place] [facility]" system interpretation.   Juel Burrow, DNP, AGNP-C Palliative Medicine Team [phone] Pager: [phone]

## 2023-02-27 NOTE — Progress Notes (Signed)
Physical Therapy Wound Treatment Patient Details  Name: Wendy Crawford MRN: [id number] Date of Birth: 1956/06/15  Today's Date: 02/27/2023 Time: [id number] Time Calculation (min): 55 min  Subjective  Subjective Assessment Subjective: pt intubated and not communicating with therapist Patient and Family Stated Goals: Family working with palliative for [place] Date of Onset: 02/20/23 Prior Treatments: surgical debridement; dressing changes  Pain Score:   Pt appeared mildly uncomfortable at times but overall tolerated treatment well and appeared to be resting.   Wound Assessment  Wound / Incision (Open or Dehisced) 02/20/23 Incision - Open Buttocks Right Rt buttock (Active)  Wound Image   02/27/23 1528  Dressing Type Moist to dry;Gauze (Comment);ABD 02/27/23 1528  Dressing Changed Changed 02/27/23 1528  Dressing Status Clean, Dry, Intact 02/27/23 1528  Dressing Change Frequency Twice a day 02/27/23 1528  Site / Wound Assessment Red;Pink;Yellow;Granulation tissue;Brown 02/27/23 1528  % Wound base Red or Granulating 75% 02/27/23 1528  % Wound base Yellow/Fibrinous Exudate 25% 02/27/23 1528  % Wound base Black/Eschar 0% 02/27/23 1528  % Wound base Other/Granulation Tissue (Comment) 0% 02/27/23 1528  Peri-wound Assessment Pink 02/27/23 1528  Wound Length (cm) 5.5 cm 02/27/23 1030  Wound Width (cm) 9.8 cm 02/27/23 1030  Wound Depth (cm) 3.3 cm 02/27/23 1030  Wound Volume (cm^3) 177.87 cm^3 02/27/23 1030  Wound Surface Area (cm^2) 53.9 cm^2 02/27/23 1030  Tunneling (cm) medial upper tunnel 10.cm medial lower tunnel 8.9 cm 02/27/23 1030  Margins Unattached edges (unapproximated) 02/27/23 1528  Closure None 02/27/23 1528  Drainage Amount Moderate 02/27/23 1528  Drainage Description Serosanguineous 02/27/23 1528  Non-staged Wound Description Full thickness 02/27/23 1528  Treatment Debridement (Selective);Hydrotherapy (Pulse lavage);Packing (Saline gauze) 02/27/23 1528     Wound / Incision  (Open or Dehisced) 02/20/23 Incision - Open Buttocks Left Left buttock (Active)  Dressing Type Moist to dry;Gauze (Comment);ABD 02/27/23 1528  Dressing Changed Changed 02/27/23 1030  Dressing Status Clean, Dry, Intact;New drainage 02/27/23 1528  Dressing Change Frequency Twice a day 02/27/23 1528  Site / Wound Assessment Bleeding;Red;Pink;Yellow;Granulation tissue;Brown 02/27/23 1528  % Wound base Red or Granulating 60% 02/27/23 1528  % Wound base Yellow/Fibrinous Exudate 40% 02/27/23 1528  % Wound base Black/Eschar 0% 02/27/23 1528  % Wound base Other/Granulation Tissue (Comment) 0% 02/27/23 1528  Peri-wound Assessment Other (Comment) 02/27/23 1528  Wound Length (cm) 11 cm 02/27/23 1030  Wound Width (cm) 9.5 cm 02/27/23 1030  Wound Depth (cm) 3.5 cm 02/27/23 1030  Wound Volume (cm^3) 365.75 cm^3 02/27/23 1030  Wound Surface Area (cm^2) 104.5 cm^2 02/27/23 1030  Undermining (cm) 11:00 to 1:00 4.0cm 02/27/23 1030  Margins Unattached edges (unapproximated) 02/27/23 1528  Closure None 02/27/23 1528  Drainage Amount Moderate 02/27/23 1528  Drainage Description Serosanguineous 02/27/23 1528  Non-staged Wound Description Full thickness 02/27/23 1528  Treatment Debridement (Selective);Hydrotherapy (Pulse lavage);Packing (Saline gauze) 02/27/23 1528   Hydrotherapy Pulsed lavage therapy - wound location: bilateral buttocks Pulsed Lavage with Suction (psi): 8 psi Pulsed Lavage with Suction - Normal Saline Used: 1000 mL Pulsed Lavage Tip: Tip with splash shield Selective Debridement (non-excisional) Selective Debridement (non-excisional) - Location: bil buttocks Selective Debridement (non-excisional) - Tools Used: Forceps, Scalpel, Scissors Selective Debridement (non-excisional) - Tissue Removed: brown and yellow unviable tissue    Wound Assessment and Plan  Wound Therapy - Assess/Plan/Recommendations Wound Therapy - Clinical Statement: Wound bed improving. Initiated pulsed lavage this  session, will continue as needed. This patient will benefit from continued hydrotherapy for selective removal of unviable tissue, to decrease bioburden,  and promote wound bed healing. Wound Therapy - Functional Problem List: sedated and unable to change positions Factors Delaying/Impairing Wound Healing: Infection - systemic/local, Immobility, Other (comment) (wound location--near rectum) Hydrotherapy Plan: Debridement, Dressing change, Patient/family education, Pulsatile lavage with suction, Other (comment) (irrigation) Wound Therapy - Frequency: 3X / week Wound Therapy - Follow Up Recommendations: dressing changes by RN  Wound Therapy Goals- Improve the function of patient's integumentary system by progressing the wound(s) through the phases of wound healing (inflammation - proliferation - remodeling) by: Wound Therapy Goals - Improve the function of patient's integumentary system by progressing the wound(s) through the phases of wound healing by: Decrease Necrotic Tissue to: 20% Decrease Necrotic Tissue - Progress: Progressing toward goal Increase Granulation Tissue to: 80% (including healthy fat tissue) Increase Granulation Tissue - Progress: Progressing toward goal Improve Drainage Characteristics: Min Improve Drainage Characteristics - Progress: Goal set today Goals/treatment plan/discharge plan were made with and agreed upon by patient/family: No, Patient unable to participate in goals/treatment/discharge plan and family unavailable Time For Goal Achievement: 7 days Wound Therapy - Potential for Goals: Good  Goals will be updated until maximal potential achieved or discharge criteria met.  Discharge criteria: when goals achieved, discharge from hospital, MD decision/surgical intervention, no progress towards goals, refusal/missing three consecutive treatments without notification or medical reason.  GP     Charges PT Wound Care Charges $Wound Debridement up to 20 cm: < or equal to  20 cm $ Wound Debridement each add'l 20 sqcm: 2 $PT Hydrotherapy Dressing: 1 dressing $PT PLS Gun and Tip: 1 Supply $PT Hydrotherapy Visit: 1 Visit       Thelma Comp 02/27/2023, 3:41 PM  Rolinda Roan, PT, DPT Acute Rehabilitation Services Secure Chat Preferred Office: [phone]

## 2023-02-27 NOTE — Progress Notes (Signed)
NAME:  Wendy Crawford, MRN:  [id number], DOB:  July 16, 1956, LOS: 7 ADMISSION DATE:  02/20/2023, CONSULTATION DATE:  02/20/23 REFERRING MD:  EDP, CHIEF COMPLAINT:  hypotension   History of Present Illness:  Wendy Crawford is a 67 y.o. F with PMH significant for obesity, depression, headaches, osteoarthritis and tobacco use who presents to the ED with dizziness and low blood pressure.  She began feeling poorly about one week ago when she noticed a sore on her buttock, this has grown and redness and pain has spread to both buttocks with some purulent drainage.   She then developed a cough and was diagnosed with the flu on 3/17 and has felt short of breath with nausea and vomiting and very little po intake for the last few days.  She denies chest pain, leg edema or pain.      In the ED, she was given 3.5L IVF and broad spectrum abx.  CXR consistent with LLL pneumonia.  Labs showed lactic acid of 1.7, Na 127, creatinine 1.95, WBC 115, platelets 147, WBC 11.5.   She required levophed for hypotension despite IVF, therefore PCCM consulted   Pertinent  Medical History  Osteoarthritis Cluster headaches Depression Hyperlipidemia IBS Psoriasis  Significant Hospital Events: Including procedures, antibiotic start and stop dates in addition to other pertinent events   3/20 presented with cellulitis, PNA and septic shock, PCCM admit, started on levophed  3/21 intubated, CVC line placed 3/21 debridement of wounds 3/22 debridement of wounds  Interim History / Subjective:  Patient is seen at bedside with her daughter.  Patient is awake alert but does not follow commands.  Objective   Blood pressure 139/71, pulse 77, temperature 100 F (37.8 C), temperature source Axillary, resp. rate (!) 25, weight 107.5 kg, SpO2 90 %.    Vent Mode: PRVC FiO2 (%):  [50 %] 50 % Set Rate:  [25 bmp] 25 bmp Vt Set:  [380 mL] 380 mL PEEP:  [5 cmH20] 5 cmH20 Plateau Pressure:  [16 cmH20-17 cmH20] 16 cmH20   Intake/Output  Summary (Last 24 hours) at 02/27/2023 0836 Last data filed at 02/27/2023 0800 Gross per 24 hour  Intake 2971.65 ml  Output 2925 ml  Net 46.65 ml    Filed Weights   02/25/23 0500 02/26/23 0247 02/27/23 0500  Weight: 111.2 kg 107.5 kg 107.5 kg    Examination: Physical Exam Constitutional:      Comments: Awake and alert but does not follow commands  HENT:     Head: Normocephalic.  Eyes:     General:        Right eye: No discharge.        Left eye: No discharge.     Conjunctiva/sclera: Conjunctivae normal.  Cardiovascular:     Rate and Rhythm: Normal rate and regular rhythm.     Comments: Trace edema bilateral lower and upper extremities Pulmonary:     Effort: Pulmonary effort is normal.     Comments: No wheezing  Abdominal:     General: Bowel sounds are normal. There is no distension.     Palpations: Abdomen is soft.  Musculoskeletal:        General: Normal range of motion.     Cervical back: Normal range of motion.  Skin:    General: Skin is warm.      Resolved Hospital Problem list   AKI from ATN 2nd to sepsis, Anion gap metabolic acidosis with lactic acidosis  Assessment & Plan:  Acute hypoxic respiratory failure from LLL  pneumonia, and pulmonary edema, requiring mechanical ventilator Possible COPD exacerbation Daughter reports history of COPD from her smoking history but no PFT found in chart.  Patient has been refusing inhalers at home.  Daughters state that she has dyspnea with exertion, pursed lips breathing and wheezing at home. -At Pulmicort, DuoNebs q4h, prednisone and pulmonary hygiene. -Good urine output overnight.  She still net + 5 L.  Slow down diuresis with IV Lasix 40 x 1 today due to high normal sodium. -Weaning trial as able.   -Wean down sedation.  Stop propofol and use Precedex.  Goes down on IV fentanyl as tolerated.  Continue Oxy 10 mg Q8h.  -Bowel regimen -Will need PFT outpatient  Septic shock from necrotizing soft tissue infection of b/l  buttock and perineum. E. coli bacteremia Underwent debridement on 3/21 and 3/22.  Remains afebrile. -Continue IV Zosyn (day 8).  Plan to treat for 7 days after her I&D. -Appreciate surgery team's assistance -Wound care per surgery -She may need a diverting colostomy.  Hyperglycemia due to acute illness - SSI  Anemia of critical illness and chronic disease. She may has a component of iron deficiency anemia from her surgeries as well.  So far her hemoglobin is stable around 9's. -Will check iron storage when she is no longer bacteremic -Follow CBC daily  Depression. -resume citalopram  Best Practice (right click and "Reselect all SmartList Selections" daily)   Diet/type: tubefeeds DVT prophylaxis: lovenox GI prophylaxis: PPI Lines: central Foley:  Yes, and it is still needed Code Status:  full code Last date of multidisciplinary goals of care discussion [family updated at bedside 3/27]  Labs   CBC: Recent Labs  Lab 02/23/23 0159 02/24/23 0400 02/25/23 0317 02/25/23 0323 02/26/23 0237 02/27/23 0447  WBC 8.0 11.5* 9.4  --  11.0* 9.2  HGB 9.1* 9.8* 9.0* 9.2* 9.4* 9.1*  HCT 26.6* 31.3* 28.8* 27.0* 29.5* 28.3*  MCV 86.9 91.3 90.9  --  90.5 90.7  PLT 155 178 211  --  282 241     Basic Metabolic Panel: Recent Labs  Lab 02/21/23 1612 02/22/23 0312 02/22/23 0720 02/22/23 1539 02/23/23 0159 02/24/23 0400 02/25/23 0317 02/25/23 0323 02/26/23 0237 02/27/23 0447  NA  --   --  130*  --  132* 134* 135 137 139 144  K  --   --  3.6  --  3.3* 4.1 3.6 3.8 3.4* 4.0  CL  --   --  100  --  101 104 101  --  102 104  CO2  --   --  19*  --  20* 20* 22  --  26 30  GLUCOSE  --   --  145*  --  102* 119* 124*  --  134* 117*  BUN  --   --  48*  --  42* 40* 34*  --  29* 42*  CREATININE  --   --  1.79*  --  1.21* 1.15* 0.94  --  0.83 0.79  CALCIUM  --   --  7.3*  --  7.7* 7.6* 7.8*  --  8.1* 8.4*  MG 2.6* 2.5*  --  2.7* 2.4  --  2.4  --   --   --   PHOS 4.1 4.4 4.3 3.6 2.6  --  3.5   --   --   --     GFR: Estimated Creatinine Clearance: 78.3 mL/min (by C-G formula based on SCr of 0.79 mg/dL). Recent Labs  Lab 02/20/23  1823 02/20/23 2238 02/21/23 0400 02/21/23 0846 02/23/23 0159 02/24/23 0400 02/25/23 0317 02/26/23 0237 02/27/23 0447  PROCALCITON  --  7.42  --   --   --   --   --   --   --   WBC  --  13.5* 15.9*  --    < > 11.5* 9.4 11.0* 9.2  LATICACIDVEN 1.7  --  0.8 1.1  --   --   --   --   --    < > = values in this interval not displayed.     Liver Function Tests: Recent Labs  Lab 02/20/23 1806 02/22/23 0720  AST 20  --   ALT 13  --   ALKPHOS 87  --   BILITOT 1.0  --   PROT 5.6*  --   ALBUMIN 2.5* 2.2*    No results for input(s): "LIPASE", "AMYLASE" in the last 168 hours. No results for input(s): "AMMONIA" in the last 168 hours.  ABG    Component Value Date/Time   PHART 7.329 (L) 02/25/2023 0323   PCO2ART 44.7 02/25/2023 0323   PO2ART 96 02/25/2023 0323   HCO3 23.3 02/25/2023 0323   TCO2 25 02/25/2023 0323   ACIDBASEDEF 2.0 02/25/2023 0323   O2SAT 96 02/25/2023 0323     Coagulation Profile: Recent Labs  Lab 02/20/23 1823  INR 1.3*     Cardiac Enzymes: No results for input(s): "CKTOTAL", "CKMB", "CKMBINDEX", "TROPONINI" in the last 168 hours.  HbA1C: Hgb A1c MFr Bld  Date/Time Value Ref Range Status  01/21/2023 10:08 AM 5.8 (H) 4.8 - 5.6 % Final    Comment:             Prediabetes: 5.7 - 6.4          Diabetes: >6.4          Glycemic control for adults with diabetes: <7.0   07/26/2022 09:51 AM 5.9 (H) 4.8 - 5.6 % Final    Comment:             Prediabetes: 5.7 - 6.4          Diabetes: >6.4          Glycemic control for adults with diabetes: <7.0     CBG: Recent Labs  Lab 02/26/23 1531 02/26/23 1913 02/26/23 2326 02/27/23 0323 02/27/23 0716  GLUCAP 160* 145* Dooms    Gaylan Gerold, DO Internal Medicine Residency My pager: [phone]

## 2023-02-28 DIAGNOSIS — R6521 Severe sepsis with septic shock: Secondary | ICD-10-CM | POA: Diagnosis not present

## 2023-02-28 DIAGNOSIS — Z7189 Other specified counseling: Secondary | ICD-10-CM | POA: Diagnosis not present

## 2023-02-28 DIAGNOSIS — M726 Necrotizing fasciitis: Secondary | ICD-10-CM | POA: Diagnosis not present

## 2023-02-28 DIAGNOSIS — A419 Sepsis, unspecified organism: Secondary | ICD-10-CM | POA: Diagnosis not present

## 2023-02-28 DIAGNOSIS — Z515 Encounter for palliative care: Secondary | ICD-10-CM | POA: Diagnosis not present

## 2023-02-28 LAB — BASIC METABOLIC PANEL
Anion gap: 8 (ref 5–15)
BUN: 40 mg/dL — ABNORMAL HIGH (ref 8–23)
CO2: 31 mmol/L (ref 22–32)
Calcium: 8.6 mg/dL — ABNORMAL LOW (ref 8.9–10.3)
Chloride: 105 mmol/L (ref 98–111)
Creatinine, Ser: 0.73 mg/dL (ref 0.44–1.00)
GFR, Estimated: >60 mL/min (ref >60)
Glucose, Bld: 141 mg/dL — ABNORMAL HIGH (ref 70–99)
Potassium: 3.9 mmol/L (ref 3.5–5.1)
Sodium: 144 mmol/L (ref 135–145)

## 2023-02-28 LAB — CBC
HCT: 28.5 % — ABNORMAL LOW (ref 36.0–46.0)
Hemoglobin: 9.1 g/dL — ABNORMAL LOW (ref 12.0–15.0)
MCH: 29.1 pg (ref 26.0–34.0)
MCHC: 31.9 g/dL (ref 30.0–36.0)
MCV: 91.1 fL (ref 80.0–100.0)
Platelets: 290 10*3/uL (ref 150–400)
RBC: 3.13 MIL/uL — ABNORMAL LOW (ref 3.87–5.11)
RDW: 15.1 % (ref 11.5–15.5)
WBC: 9.6 10*3/uL (ref 4.0–10.5)
nRBC: 0 % (ref 0.0–0.2)

## 2023-02-28 LAB — GLUCOSE, CAPILLARY
Glucose-Capillary: 116 mg/dL — ABNORMAL HIGH (ref 70–99)
Glucose-Capillary: 153 mg/dL — ABNORMAL HIGH (ref 70–99)
Glucose-Capillary: 190 mg/dL — ABNORMAL HIGH (ref 70–99)
Glucose-Capillary: 174 mg/dL — ABNORMAL HIGH (ref 70–99)
Glucose-Capillary: 151 mg/dL — ABNORMAL HIGH (ref 70–99)
Glucose-Capillary: 122 mg/dL — ABNORMAL HIGH (ref 70–99)

## 2023-02-28 MED ORDER — ACETAMINOPHEN 500 MG PO TABS
1000.0000 mg | ORAL_TABLET | Freq: Four times a day (QID) | ORAL | Status: DC
  Administered 2023-02-28 – 2023-03-01 (×4): 1000 mg
  Filled 2023-02-28 (×4): qty 2

## 2023-02-28 MED ORDER — FUROSEMIDE 10 MG/ML IJ SOLN
40.0000 mg | Freq: Once | INTRAMUSCULAR | Status: AC
  Administered 2023-02-28: 40 mg via INTRAVENOUS
  Filled 2023-02-28: qty 4

## 2023-02-28 MED ORDER — FREE WATER
200.0000 mL | Status: DC
  Administered 2023-02-28 – 2023-03-01 (×7): 200 mL

## 2023-02-28 MED ORDER — GABAPENTIN 250 MG/5ML PO SOLN
300.0000 mg | Freq: Three times a day (TID) | ORAL | Status: DC
  Administered 2023-02-28 – 2023-03-01 (×4): 300 mg
  Filled 2023-02-28 (×6): qty 6

## 2023-02-28 MED ORDER — OXYCODONE HCL 5 MG PO TABS
20.0000 mg | ORAL_TABLET | Freq: Three times a day (TID) | ORAL | Status: DC
  Administered 2023-02-28 – 2023-03-01 (×3): 20 mg
  Filled 2023-02-28 (×3): qty 4

## 2023-02-28 MED ORDER — ACETAMINOPHEN 500 MG PO TABS: 1000.0000 mg | ORAL_TABLET | Freq: Four times a day (QID) | ORAL | Status: DC

## 2023-02-28 MED ORDER — LACTATED RINGERS IV BOLUS
500.0000 mL | Freq: Once | INTRAVENOUS | Status: AC
  Administered 2023-02-28: 500 mL via INTRAVENOUS

## 2023-02-28 NOTE — Progress Notes (Addendum)
[facility] Progress Note  6 Days Post-Op  Subjective: CC-  Remains on the vent.  Palliative following. DNR.  WBC 9.6. Tmax 100 Flexiseal in place with liquid stool in pouch. RN reports no issues with leaking.   Objective: Vital signs in last 24 hours: Temp:  [97.2 F (36.2 C)-99.6 F (37.6 C)] 98.8 F (37.1 C) (03/28 0723) Pulse Rate:  [48-101] 82 (03/28 0733) Resp:  [10-25] 22 (03/28 0733) BP: (96-152)/(50-88) 138/63 (03/28 0600) SpO2:  [90 %-97 %] 97 % (03/28 0733) Arterial Line BP: (115-155)/(50-63) 115/50 (03/27 0830) FiO2 (%):  [40 %-50 %] 40 % (03/28 0733) Weight:  [107.7 kg] 107.7 kg (03/28 0500) Last BM Date : 02/27/23  Intake/Output from previous day: 03/27 0701 - 03/28 0700 In: 3402 [I.V.:1164.3; NG/GT:2090; IV Piggyback:147.7] Out: 3600 [Urine:3300; Stool:300] Intake/Output this shift: No intake/output data recorded.  PE: Gen:  on the vent HEENT: Cortrak in place Pulm:  mechanically ventilated GU: Seen with RN. Large bilateral buttock wounds pictured below. Flexiseal in place with liquid brown stool in bag.  R buttock wound with area of tunneling on the medial aspect towards base of labia. No drainage. There is also undermining cephalad under the most superior skin border. No drainage. No cellulitis or induration noted externally. Remainder of the wound appears clean with mixture of granulation and adipose tissue  L buttock wound without any significant tunneling/ undermining. No purulent drainage or induration. Wound base with mixture of granulation and adipose tissue   Lab Results:  Recent Labs    02/27/23 0447 02/28/23 0520  WBC 9.2 9.6  HGB 9.1* 9.1*  HCT 28.3* 28.5*  PLT 241 290    BMET Recent Labs    02/27/23 0447 02/28/23 0520  NA 144 144  K 4.0 3.9  CL 104 105  CO2 30 31  GLUCOSE 117* 141*  BUN 42* 40*  CREATININE 0.79 0.73  CALCIUM 8.4* 8.6*    PT/INR No results for input(s): "LABPROT", "INR" in the last 72  hours. CMP     Component Value Date/Time   NA 144 02/28/2023 0520   NA 141 01/21/2023 1008   K 3.9 02/28/2023 0520   CL 105 02/28/2023 0520   CO2 31 02/28/2023 0520   GLUCOSE 141 (H) 02/28/2023 0520   BUN 40 (H) 02/28/2023 0520   BUN 14 01/21/2023 1008   CREATININE 0.73 02/28/2023 0520   CREATININE 0.87 04/11/2021 0838   CALCIUM 8.6 (L) 02/28/2023 0520   PROT 5.6 (L) 02/20/2023 1806   PROT 7.1 01/21/2023 1008   ALBUMIN 2.2 (L) 02/22/2023 0720   ALBUMIN 4.8 01/21/2023 1008   AST 20 02/20/2023 1806   ALT 13 02/20/2023 1806   ALKPHOS 87 02/20/2023 1806   BILITOT 1.0 02/20/2023 1806   BILITOT 0.3 01/21/2023 1008   GFRNONAA >60 02/28/2023 0520   GFRNONAA 70 04/11/2021 0838   GFRAA 82 04/11/2021 0838   Lipase  No results found for: "LIPASE"     Studies/Results: DG CHEST PORT 1 VIEW  Result Date: 02/26/2023 CLINICAL DATA:  Shortness of breath EXAM: PORTABLE CHEST 1 VIEW COMPARISON:  Chest radiograph 1 day prior FINDINGS: The right internal jugular catheter terminates in the lower SVC. There is a possible kink within the catheter in the lower neck which is new from the prior study. The enteric catheter tip is below the diaphragm off the field of view. The cardiomediastinal silhouette is stable. There is a small left pleural effusion with associated left basilar opacity, unchanged. There  is no significant right effusion. There is vascular congestion with prominence of the pulmonary arteries but no definite overt pulmonary edema. There is no pneumothorax. Overall, aeration is not significantly changed. The bones are stable. IMPRESSION: 1. No significant interval change in lung aeration with small left pleural effusion and adjacent left basilar opacity. 2. Prominent pulmonary vasculature without definite overt pulmonary edema. 3. Possible kink in the right internal jugular venous catheter in the lower neck. Correlate with catheter function. Electronically Signed   By: Valetta Mole M.D.    On: 02/26/2023 08:40    Anti-infectives: Anti-infectives (From admission, onward)    Start     Dose/Rate Route Frequency Ordered Stop   02/21/23 1000  linezolid (ZYVOX) IVPB 600 mg  Status:  Discontinued        600 mg 300 mL/hr over 60 Minutes Intravenous Every 12 hours 02/21/23 0902 02/24/23 0817   02/21/23 1000  piperacillin-tazobactam (ZOSYN) IVPB 3.375 g        3.375 g 12.5 mL/hr over 240 Minutes Intravenous Every 8 hours 02/21/23 0902  2359   02/21/23 0600  clindamycin (CLEOCIN) IVPB 600 mg  Status:  Discontinued        600 mg 100 mL/hr over 30 Minutes Intravenous Every 8 hours 02/21/23 0237 02/21/23 0902   02/20/23 2300  ceFEPIme (MAXIPIME) 2 g in sodium chloride 0.9 % 100 mL IVPB  Status:  Discontinued        2 g 200 mL/hr over 30 Minutes Intravenous Every 12 hours 02/20/23 2222 02/21/23 0902   02/20/23 2230  clindamycin (CLEOCIN) IVPB 600 mg        600 mg 100 mL/hr over 30 Minutes Intravenous  Once 02/20/23 2216 02/20/23 2345   02/20/23 1942  vancomycin variable dose per unstable renal function (pharmacist dosing)  Status:  Discontinued         Does not apply See admin instructions 02/20/23 1942 02/21/23 0902   02/20/23 1845  vancomycin (VANCOREADY) IVPB 2000 mg/400 mL        2,000 mg 200 mL/hr over 120 Minutes Intravenous  Once 02/20/23 1838 02/20/23 2153   02/20/23 1830  vancomycin (VANCOCIN) IVPB 1000 mg/200 mL premix  Status:  Discontinued        1,000 mg 200 mL/hr over 60 Minutes Intravenous  Once 02/20/23 1824 02/20/23 1838   02/20/23 1830  cefTRIAXone (ROCEPHIN) 2 g in sodium chloride 0.9 % 100 mL IVPB        2 g 200 mL/hr over 30 Minutes Intravenous  Once 02/20/23 1824 02/20/23 2000        Assessment/Plan -POD#8 s/p Excisional debridement NSTI bilateral buttocks 3/20 Dr. Donne Hazel -POD#6 s/p Debridement of soft tissue infection bilateral gluteal regions and perineum  3/22 Dr. Georgette Dover - Cx with E. Coli, Bacteroides Fragilis and Beta Lactamase positive.  Cont abx. With patient also having E. Coli Bacteremia will defer narrowing to primary  - No indication for further surgical debridement - Cont Hydrotherapy  - Cont BID WTD dressing changes. Please pack the tunneled portion at the medial aspect of the right buttock wound  - Cont Flexiseal. May need to consider diverting colostomy if stool contamination of the wounds becomes an issue. Do not think she needs this currently.  - We will see as needed over the weekend. Plan to see next on 4/1. Please call with any questions, concerns or changes in the interval. If is discharged prior to 4/1, would recommended continuing BID WTD dressing changes and f/u at wound  care center.    ID - Zosyn FEN - NPO, TF VTE - SCDs, sq heparin Foley - in place   Septic shock  Acute Hypoxic Respiratory failure secondary to flu (tested positive outpatient) and LLL pneumonia - vent per ccm Tobacco use AKI Inflammatory bowel disease HTN HLD Anxiety DNR    LOS: 8 days    Jillyn Ledger, [facility] 02/28/2023, 7:57 AM Please see Amion for pager number during day hours 7:00am-4:30pm

## 2023-02-28 NOTE — Progress Notes (Signed)
eLink Physician-Brief Progress Note Patient Name: Wendy Crawford DOB: [birth year] MRN: [id number]   Date of Service  02/28/2023  HPI/Events of Note  Patient is intubated and requires order for soft wrist restraints to prevent self-extubation.  eICU Interventions  Restraints ordered.        Frederik Pear 02/28/2023, 9:49 PM

## 2023-02-28 NOTE — Progress Notes (Signed)
Pt placed on PS/CPAP 5/5 on 40% and is tolerating well. RT will monitor. 

## 2023-02-28 NOTE — TOC Initial Note (Signed)
Transition of Care [facility]) - Initial/Assessment Note    Patient Details  Name: Wendy Crawford MRN: [id number] Date of Birth: October 09, 1956  [facility]) CM/SW Contact:    Joanne Chars, LCSW Phone Number: 02/28/2023, 9:40 AM  Clinical Narrative:     CSW spoke with pt husband [name] about potential LTAC admission and facility choice.  Pt would like to pursue [facility] due to it being on site.  Pt lives at home with husband.  No current services.  Pt is not vaccinated for covid.              Expected Discharge Plan: Long Term Acute Care (LTAC) Barriers to Discharge: Other (must enter comment), Continued Medical Work up (LTAC bed availability and auth)   Patient Goals and CMS Choice     Choice offered to / list presented to : Spouse (husband [name]      Expected Discharge Plan and Services In-house Referral: Clinical Social Work   Post Acute Care Choice: Long Term Acute Care (LTAC) Living arrangements for the past 2 months: [place]                                      Prior Living Arrangements/Services Living arrangements for the past 2 months: Single Family Home Lives with:: Spouse          Need for Family Participation in Patient Care: Yes (Comment) Care giver support system in place?: Yes (comment) Current home services: Other (comment) (none) Criminal Activity/Legal Involvement Pertinent to Current Situation/Hospitalization: No - Comment as needed  Activities of Daily Living      Permission Sought/Granted                  Emotional Assessment              Admission diagnosis:  Necrotizing fasciitis ([place]) [M72.6] Septic shock ([place]) [A41.9, R65.21] Hypotension, unspecified hypotension type [I95.9] Patient Active Problem List   Diagnosis Date Noted   Necrotizing fasciitis ([place]) 02/23/2023   Hypotension due to hypovolemia 02/20/2023   Septic shock ([place]) 02/20/2023   Fournier gangrene 02/20/2023   Smoking 01/21/2023    Osteopenia 01/01/2023   Sleep difficulties 01/01/2023   Morbid obesity ([place]) 01/01/2023   BMI 40.0-44.9, adult ([place]) 01/01/2023   Adjustment insomnia 11/07/2022   Class 3 severe obesity with serious comorbidity and body mass index (BMI) of 45.0 to 49.9 in adult ([place]) 09/04/2022   Prediabetes 09/04/2022   Osteoarthritis of right knee 09/04/2022   Pre-diabetes 08/02/2022   Other hyperlipidemia 08/02/2022   Other specified hypothyroidism 08/02/2022   Caregiver stress syndrome - emotional eating 05/31/2022   Vitamin D deficiency 05/31/2022   Blood glucose elevated 03/20/2022   Elevated lipoprotein(a) 03/20/2022   Mood disorder ([place]) 03/20/2022   Status post total left knee replacement 11/07/2018   Primary osteoarthritis of both knees 03/26/2018   Chronic pain of both knees 07/01/2017   Unilateral primary osteoarthritis, left knee 07/01/2017   Unilateral primary osteoarthritis, right knee 07/01/2017   Osteoarthritis of left hip 06/08/2016   Status post total replacement of left hip 06/08/2016   Chronic cluster headache, not intractable 09/13/2015   COPD exacerbation ([place]) 09/13/2015   Primary snoring 06/17/2014   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

## 2023-02-28 NOTE — IPAL (Signed)
  Interdisciplinary Goals of Care Family Meeting   Date carried out:: 02/28/2023  Location of the meeting: Conference room  Member's involved: Physician and Family Member or next of kin  Durable Power of Attorney or acting medical decision maker: Sherrye Payor [place], Spouse    Discussion: We discussed goals of care for [name] .    I met with patient's spouse, sister, daughter, son-in-law and grand daughter.  I updated them on her current medical status.  They understand that she will need a long process of weaning off the ventilator and extensive care for her wound.  Patient's husband, [name], states that patient would not wanted to be on a ventilator or to reside in a facility.  Patient's family is all in agreement with this.  They would like to proceed with comfort focus care and compassionate extubation.  They would like to have a few more family members to visit her.  Plan for extubation may be tomorrow or over the weekend.  Code status: Full DNR  Disposition: In-patient comfort care  Time spent for the meeting: 20 min  Gaylan Gerold 02/28/2023, 1:24 PM

## 2023-02-28 NOTE — Progress Notes (Signed)
Pt placed back on full support due to increased WOB.

## 2023-02-28 NOTE — Progress Notes (Signed)
Pt's ventilator ringing out low tidal volumes, breathing 50 times a minute. Pt agitated and restless in bed. RN suctioned pt with minimal secretions out. RN called RT and pt SpO2 desated to the low 70's. RT at bedside was able to suction up a mucus plug. Pt's SpO2 sat recovered to the 90's. Pt once full support on ventilator. MD notified.

## 2023-02-28 NOTE — Progress Notes (Signed)
Daily Progress Note   Patient Name: Wendy Crawford       Date: 02/28/2023 DOB: [birth year]  Age: 67 y.o. MRN#: [id number] Attending Physician: Marshell Garfinkel, MD Primary Care Physician: [name], MD Admit Date: 02/20/2023  Reason for Consultation/Follow-up: Establishing goals of care  Subjective: Remains intubated, appears agitated, RN administering medication   Length of Stay: 8  Current Medications: Scheduled Meds:   acetaminophen  1,000 mg Per Tube Q6H   budesonide (PULMICORT) nebulizer solution  0.25 mg Nebulization BID   Chlorhexidine Gluconate Cloth  6 each Topical Daily   citalopram  20 mg Per Tube Daily   docusate  100 mg Per Tube BID   enoxaparin (LOVENOX) injection  50 mg Subcutaneous Q24H   free water  200 mL Per Tube Q4H   gabapentin  300 mg Per Tube Q8H   guaiFENesin  5 mL Per Tube Q6H   insulin aspart  0-9 Units Subcutaneous Q4H   nutrition supplement (JUVEN)  1 packet Per Tube BID BM   mouth rinse  15 mL Mouth Rinse Q2H   oxyCODONE  20 mg Per Tube Q8H   pantoprazole (PROTONIX) IV  40 mg Intravenous Q24H   polyethylene glycol  17 g Per Tube Daily   predniSONE  40 mg Per Tube Daily   sodium chloride flush  10-40 mL Intracatheter Q12H    Continuous Infusions:  sodium chloride Stopped (02/28/23 0918)   dexmedetomidine (PRECEDEX) IV infusion 1 mcg/kg/hr (02/28/23 1200)   feeding supplement (VITAL AF 1.2 CAL) 60 mL/hr at 02/28/23 1200   fentaNYL infusion INTRAVENOUS 200 mcg/hr (02/28/23 1236)   piperacillin-tazobactam (ZOSYN)  IV 12.5 mL/hr at 02/28/23 1200    PRN Meds: sodium chloride, fentaNYL, midazolam, ondansetron (ZOFRAN) IV, mouth rinse, sodium chloride flush, white petrolatum  Physical Exam Constitutional:      Appearance: She is ill-appearing.      Comments: Appears agitated on ventilator Does not follow commands Remains on full vent support this AM  Skin:    General: Skin is warm and dry.             Vital Signs: BP 120/64   Pulse 71   Temp 99.3 F (37.4 C) (Axillary)   Resp (!) 24   Wt 107.7 kg   SpO2 94%   BMI 44.86 kg/m  SpO2: SpO2: 94 % O2 Device: O2 Device: Ventilator O2 Flow Rate:    Intake/output summary:  Intake/Output Summary (Last 24 hours) at 02/28/2023 1304 Last data filed at 02/28/2023 1200 Gross per 24 hour  Intake 3875.51 ml  Output 3825 ml  Net 50.51 ml   LBM: Last BM Date : 02/27/23 Baseline Weight: Weight: 106.7 kg Most recent weight: Weight: 107.7 kg      Patient Active Problem List   Diagnosis Date Noted   Necrotizing fasciitis ([place]) 02/23/2023   Hypotension due to hypovolemia 02/20/2023   Septic shock ([place]) 02/20/2023   Fournier gangrene 02/20/2023   Smoking 01/21/2023   Osteopenia 01/01/2023   Sleep difficulties 01/01/2023   Morbid obesity ([employer]) 01/01/2023   BMI 40.0-44.9, adult ([place]) 01/01/2023   Adjustment insomnia 11/07/2022   Class 3 severe obesity with serious comorbidity and body mass index (  BMI) of 45.0 to 49.9 in adult [facility]) 09/04/2022   Prediabetes 09/04/2022   Osteoarthritis of right knee 09/04/2022   Pre-diabetes 08/02/2022   Other hyperlipidemia 08/02/2022   Other specified hypothyroidism 08/02/2022   Caregiver stress syndrome - emotional eating 05/31/2022   Vitamin D deficiency 05/31/2022   Blood glucose elevated 03/20/2022   Elevated lipoprotein(a) 03/20/2022   Mood disorder ([place]) 03/20/2022   Status post total left knee replacement 11/07/2018   Primary osteoarthritis of both knees 03/26/2018   Chronic pain of both knees 07/01/2017   Unilateral primary osteoarthritis, left knee 07/01/2017   Unilateral primary osteoarthritis, right knee 07/01/2017   Osteoarthritis of left hip 06/08/2016   Status post total replacement of left hip 06/08/2016   Chronic cluster  headache, not intractable 09/13/2015   COPD exacerbation ([place]) 09/13/2015   Primary snoring 06/17/2014   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 female  with past medical history of obesity, depression, headaches, osteoarthritis and tobacco use  admitted on 02/20/2023 with dizziness and low blood pressure.  Patient diagnosed with septic shock from necrotizing soft tissue infection of buttock and perineum.  Found to have E. coli bacteremia.  Patient was acute hypoxic respiratory failure requiring mechanical ventilator.  PMT consulted to discuss goals of care.   Assessment: Met with daughter outside room. Discussed ongoing care and clinical picture. All questions addressed.  We discussed concerns about quality of life and what the patient may face in the future as far as his aggressive medical care is concerned.  We discussed the option of LTAC.  Daughter does not feel patient would want to continue in this situation as she does not feel patient would find ongoing aggressive medical care is an acceptable quality of life.  We discussed what DNR means.  Ongoing discussions needed.  Also spoke with patient's spouse via telephone.  He is planning on meeting with physician in a little bit.  We reviewed her conversation from yesterday.  All questions addressed.  We discussed attempting to keep the patient at the center of decision making.  I explained to him if he is making decisions for the patient based off what he thinks she would choose for herself he is serving as a good Ambulance person.  We also discussed the option of LTAC and what this may entail her patient.  Recommendations/Plan: Ongoing conversations with spouse and daughter -concerns about  quality of life and what the patient would want No CPR PMT will continue to follow  Care plan was discussed with Dr. Alfonse Spruce, Dr. Vaughan Browner, RN, patient's spouse and daughter  Thank you for allowing the Palliative Medicine Team to assist in the care of this patient.  *Please note that this is a verbal dictation therefore any spelling or grammatical errors are due to the "[place] [employer]" system interpretation.  Juel Burrow, DNP, [facility] Palliative Medicine Team Team Phone # [phone]  Pager [phone]

## 2023-02-28 NOTE — Progress Notes (Signed)
NAME:  Wendy Crawford, MRN:  [id number], DOB:  [birth year], LOS: 44 ADMISSION DATE:  02/20/2023, CONSULTATION DATE:  02/20/23 REFERRING MD:  EDP, CHIEF COMPLAINT:  hypotension   History of Present Illness:  Wendy Crawford is a 67 y.o. F with PMH significant for obesity, depression, headaches, osteoarthritis and tobacco use who presents to the ED with dizziness and low blood pressure.  She began feeling poorly about one week ago when she noticed a sore on her buttock, this has grown and redness and pain has spread to both buttocks with some purulent drainage.   She then developed a cough and was diagnosed with the flu on 3/17 and has felt short of breath with nausea and vomiting and very little po intake for the last few days.  She denies chest pain, leg edema or pain.      In the ED, she was given 3.5L IVF and broad spectrum abx.  CXR consistent with LLL pneumonia.  Labs showed lactic acid of 1.7, Na 127, creatinine 1.95, WBC 115, platelets 147, WBC 11.5.   She required levophed for hypotension despite IVF, therefore PCCM consulted   Pertinent  Medical History  Osteoarthritis Cluster headaches Depression Hyperlipidemia IBS Psoriasis  Significant Hospital Events: Including procedures, antibiotic start and stop dates in addition to other pertinent events   3/20 presented with cellulitis, PNA and septic shock, PCCM admit, started on levophed  3/21 intubated, CVC line placed 3/21 debridement of wounds 3/22 debridement of wounds  Interim History / Subjective:  Patient is seen at bedside with her daughter.  Patient is awake alert but does not follow commands.  Objective   Blood pressure 138/63, pulse 82, temperature 98.8 F (37.1 C), temperature source Axillary, resp. rate (!) 22, weight 107.7 kg, SpO2 97 %.    Vent Mode: PSV;CPAP FiO2 (%):  [40 %-50 %] 40 % Set Rate:  [25 bmp] 25 bmp Vt Set:  [380 mL] 380 mL PEEP:  [5 cmH20] 5 cmH20 Pressure Support:  [5 cmH20-6 cmH20] 5 cmH20 Plateau  Pressure:  [18 cmH20] 18 cmH20   Intake/Output Summary (Last 24 hours) at 02/28/2023 0802 Last data filed at 02/28/2023 0600 Gross per 24 hour  Intake 3309.45 ml  Output 3500 ml  Net -190.55 ml   Filed Weights   02/26/23 0247 02/27/23 0500 02/28/23 0500  Weight: 107.5 kg 107.5 kg 107.7 kg    Examination: Physical Exam Constitutional:      Comments: Awake and alert but does not follow commands.  Moving extremities not purposefully  HENT:     Head: Normocephalic.  Eyes:     General:        Right eye: No discharge.        Left eye: No discharge.     Conjunctiva/sclera: Conjunctivae normal.  Cardiovascular:     Rate and Rhythm: Normal rate and regular rhythm.     Comments: Trace edema bilateral lower and upper extremities Pulmonary:     Effort: Pulmonary effort is normal.     Comments: Diffuse rhonchi Abdominal:     General: Bowel sounds are normal. There is no distension.     Palpations: Abdomen is soft.  Musculoskeletal:        General: Normal range of motion.     Cervical back: Normal range of motion.  Skin:    General: Skin is warm.      Resolved Hospital Problem list   AKI from ATN 2nd to sepsis, Anion gap metabolic acidosis with lactic acidosis  Assessment & Plan:  Acute hypoxic respiratory failure from LLL pneumonia, and pulmonary edema, requiring mechanical ventilator Possible COPD exacerbation Daughter reports history of COPD from her smoking history but no PFT found in chart.  Patient has been refusing inhalers at home.  Daughters state that she has dyspnea with exertion, pursed lips breathing and wheezing at home. Attempted waking trial this morning.  Patient is not following commands. -Continue Pulmicort, DuoNebs q4h, prednisone and pulmonary hygiene. -Continue Lasix 40 mg daily -Weaning trial as able.   -Continue Precedex and fentanyl.  Wean if able -Continue oxycodone 20 mg Q8H -Bowel regimen -Will need PFT outpatient -Palliative team had a discussion  with family yesterday.  Her CODE STATUS is changed to DNR.  In case that she cannot be weaned from the ventilator, family would not proceed with a tracheostomy.  This is going to be a long wean process so will look into LTAC option.  Septic shock from necrotizing soft tissue infection of b/l buttock and perineum. E. coli bacteremia Underwent debridement on 3/21 and 3/22.  Remains afebrile. -Continue IV Zosyn (day 9).  Plan to treat for 7 days after her I&D. -Appreciate surgery team's assistance -Wound care per surgery -She may need a diverting colostomy.  Hyperglycemia due to acute illness - SSI  Anemia of critical illness and chronic disease. She may has a component of iron deficiency anemia from her surgeries as well.  So far her hemoglobin is stable around 9's. -Will check iron storage when she is no longer bacteremic -Follow CBC daily  Depression. -resume citalopram  Best Practice (right click and "Reselect all SmartList Selections" daily)   Diet/type: tubefeeds DVT prophylaxis: lovenox GI prophylaxis: PPI Lines: central Foley:  Yes, and it is still needed Code Status:  DNR Last date of multidisciplinary goals of care discussion [family updated at bedside 3/27]  Labs   CBC: Recent Labs  Lab 02/24/23 0400 02/25/23 0317 02/25/23 0323 02/26/23 0237 02/27/23 0447 02/28/23 0520  WBC 11.5* 9.4  --  11.0* 9.2 9.6  HGB 9.8* 9.0* 9.2* 9.4* 9.1* 9.1*  HCT 31.3* 28.8* 27.0* 29.5* 28.3* 28.5*  MCV 91.3 90.9  --  90.5 90.7 91.1  PLT 178 211  --  282 241 [id number]    Basic Metabolic Panel: Recent Labs  Lab 02/21/23 1611 02/21/23 1612 02/22/23 0312 02/22/23 0720 02/22/23 1539 02/23/23 0159 02/24/23 0400 02/25/23 0317 02/25/23 0323 02/26/23 0237 02/27/23 0447 02/28/23 0520  NA  --   --   --  130*  --  132* 134* 135 137 139 144 144  K  --   --   --  3.6  --  3.3* 4.1 3.6 3.8 3.4* 4.0 3.9  CL   < >  --   --  100  --  101 104 101  --  102 104 105  CO2   < >  --   --  19*   --  20* 20* 22  --  26 30 31   GLUCOSE   < >  --   --  145*  --  102* 119* 124*  --  134* 117* 141*  BUN   < >  --   --  48*  --  42* 40* 34*  --  29* 42* 40*  CREATININE   < >  --   --  1.79*  --  1.21* 1.15* 0.94  --  0.83 0.79 0.73  CALCIUM   < >  --   --  7.3*  --  7.7* 7.6* 7.8*  --  8.1* 8.4* 8.6*  MG  --  2.6* 2.5*  --  2.7* 2.4  --  2.4  --   --   --   --   PHOS  --  4.1 4.4 4.3 3.6 2.6  --  3.5  --   --   --   --    < > = values in this interval not displayed.   GFR: Estimated Creatinine Clearance: 78.4 mL/min (by C-G formula based on SCr of 0.73 mg/dL). Recent Labs  Lab 02/21/23 0846 02/23/23 0159 02/25/23 0317 02/26/23 0237 02/27/23 0447 02/28/23 0520  WBC  --    < > 9.4 11.0* 9.2 9.6  LATICACIDVEN 1.1  --   --   --   --   --    < > = values in this interval not displayed.    Liver Function Tests: Recent Labs  Lab 02/22/23 0720  ALBUMIN 2.2*   No results for input(s): "LIPASE", "AMYLASE" in the last 168 hours. No results for input(s): "AMMONIA" in the last 168 hours.  ABG    Component Value Date/Time   PHART 7.329 (L) 02/25/2023 0323   PCO2ART 44.7 02/25/2023 0323   PO2ART 96 02/25/2023 0323   HCO3 23.3 02/25/2023 0323   TCO2 25 02/25/2023 0323   ACIDBASEDEF 2.0 02/25/2023 0323   O2SAT 96 02/25/2023 0323     Coagulation Profile: No results for input(s): "INR", "PROTIME" in the last 168 hours.   Cardiac Enzymes: No results for input(s): "CKTOTAL", "CKMB", "CKMBINDEX", "TROPONINI" in the last 168 hours.  HbA1C: Hgb A1c MFr Bld  Date/Time Value Ref Range Status  01/21/2023 10:08 AM 5.8 (H) 4.8 - 5.6 % Final    Comment:             Prediabetes: 5.7 - 6.4          Diabetes: >6.4          Glycemic control for adults with diabetes: <7.0   07/26/2022 09:51 AM 5.9 (H) 4.8 - 5.6 % Final    Comment:             Prediabetes: 5.7 - 6.4          Diabetes: >6.4          Glycemic control for adults with diabetes: <7.0     CBG: Recent Labs  Lab  02/27/23 1533 02/27/23 1920 02/27/23 2319 02/28/23 0328 02/28/23 0719  GLUCAP [place], DO Internal Medicine Residency My pager: [phone]

## 2023-03-01 DIAGNOSIS — J9601 Acute respiratory failure with hypoxia: Secondary | ICD-10-CM | POA: Diagnosis not present

## 2023-03-01 DIAGNOSIS — R6521 Severe sepsis with septic shock: Secondary | ICD-10-CM | POA: Diagnosis not present

## 2023-03-01 DIAGNOSIS — B9620 Unspecified Escherichia coli [E. coli] as the cause of diseases classified elsewhere: Secondary | ICD-10-CM | POA: Insufficient documentation

## 2023-03-01 DIAGNOSIS — L8990 Pressure ulcer of unspecified site, unspecified stage: Secondary | ICD-10-CM | POA: Insufficient documentation

## 2023-03-01 DIAGNOSIS — Z7189 Other specified counseling: Secondary | ICD-10-CM | POA: Diagnosis not present

## 2023-03-01 DIAGNOSIS — M726 Necrotizing fasciitis: Secondary | ICD-10-CM | POA: Diagnosis not present

## 2023-03-01 DIAGNOSIS — J189 Pneumonia, unspecified organism: Secondary | ICD-10-CM | POA: Insufficient documentation

## 2023-03-01 DIAGNOSIS — A419 Sepsis, unspecified organism: Secondary | ICD-10-CM | POA: Diagnosis not present

## 2023-03-01 LAB — BASIC METABOLIC PANEL
Anion gap: 9 (ref 5–15)
BUN: 38 mg/dL — ABNORMAL HIGH (ref 8–23)
CO2: 31 mmol/L (ref 22–32)
Calcium: 8.9 mg/dL (ref 8.9–10.3)
Chloride: 105 mmol/L (ref 98–111)
Creatinine, Ser: 0.77 mg/dL (ref 0.44–1.00)
GFR, Estimated: >60 mL/min (ref >60)
Glucose, Bld: 133 mg/dL — ABNORMAL HIGH (ref 70–99)
Potassium: 3.8 mmol/L (ref 3.5–5.1)
Sodium: 145 mmol/L (ref 135–145)

## 2023-03-01 LAB — CBC
HCT: 29.5 % — ABNORMAL LOW (ref 36.0–46.0)
Hemoglobin: 9.1 g/dL — ABNORMAL LOW (ref 12.0–15.0)
MCH: 28.5 pg (ref 26.0–34.0)
MCHC: 30.8 g/dL (ref 30.0–36.0)
MCV: 92.5 fL (ref 80.0–100.0)
Platelets: 277 10*3/uL (ref 150–400)
RBC: 3.19 MIL/uL — ABNORMAL LOW (ref 3.87–5.11)
RDW: 14.9 % (ref 11.5–15.5)
WBC: 10.2 10*3/uL (ref 4.0–10.5)
nRBC: 0 % (ref 0.0–0.2)

## 2023-03-01 LAB — GLUCOSE, CAPILLARY
Glucose-Capillary: 110 mg/dL — ABNORMAL HIGH (ref 70–99)
Glucose-Capillary: 102 mg/dL — ABNORMAL HIGH (ref 70–99)

## 2023-03-01 MED ORDER — KETAMINE HCL-SODIUM CHLORIDE 1000-0.9 MG/100ML-% IV SOLN
0.5000 mg/kg/h | INTRAVENOUS | Status: DC
  Administered 2023-03-01: 0.5 mg/kg/h via INTRAVENOUS
  Filled 2023-03-01: qty 100

## 2023-03-01 MED ORDER — MIDAZOLAM HCL 2 MG/2ML IJ SOLN: 2.0000 mg | INTRAMUSCULAR | Status: DC | PRN

## 2023-03-01 MED ORDER — MORPHINE BOLUS VIA INFUSION: 5.0000 mg | INTRAVENOUS | Status: DC | PRN

## 2023-03-01 MED ORDER — GLYCOPYRROLATE 0.2 MG/ML IJ SOLN: 0.2000 mg | INTRAMUSCULAR | Status: DC | PRN

## 2023-03-01 MED ORDER — SODIUM CHLORIDE 0.9 % IV SOLN: INTRAVENOUS | Status: DC

## 2023-03-01 MED ORDER — FUROSEMIDE 10 MG/ML IJ SOLN: 40.0000 mg | Freq: Once | INTRAMUSCULAR | Status: DC

## 2023-03-01 MED ORDER — MIDAZOLAM HCL 2 MG/2ML IJ SOLN
2.0000 mg | INTRAMUSCULAR | Status: DC | PRN
  Administered 2023-03-01: 4 mg via INTRAVENOUS
  Filled 2023-03-01: qty 4

## 2023-03-01 MED ORDER — ACETAMINOPHEN 650 MG RE SUPP: 650.0000 mg | Freq: Four times a day (QID) | RECTAL | Status: DC | PRN

## 2023-03-01 MED ORDER — MIDAZOLAM HCL 2 MG/2ML IJ SOLN
2.0000 mg | INTRAMUSCULAR | Status: DC | PRN
  Administered 2023-03-01: 2 mg via INTRAVENOUS
  Filled 2023-03-01: qty 4

## 2023-03-01 MED ORDER — HYDROMORPHONE HCL 1 MG/ML IJ SOLN
1.0000 mg | INTRAMUSCULAR | Status: DC | PRN
  Administered 2023-03-01: 1 mg via INTRAVENOUS
  Filled 2023-03-01 (×2): qty 1

## 2023-03-01 MED ORDER — POLYVINYL ALCOHOL 1.4 % OP SOLN: 1.0000 [drp] | Freq: Four times a day (QID) | OPHTHALMIC | Status: DC | PRN

## 2023-03-01 MED ORDER — GLYCOPYRROLATE 1 MG PO TABS: 1.0000 mg | ORAL_TABLET | ORAL | Status: DC | PRN

## 2023-03-01 MED ORDER — ACETAMINOPHEN 325 MG PO TABS: 650.0000 mg | ORAL_TABLET | Freq: Four times a day (QID) | ORAL | Status: DC | PRN

## 2023-03-01 MED ORDER — ORAL CARE MOUTH RINSE
15.0000 mL | OROMUCOSAL | Status: DC
  Administered 2023-03-01: 15 mL via OROMUCOSAL

## 2023-03-01 MED ORDER — DIPHENHYDRAMINE HCL 50 MG/ML IJ SOLN: 25.0000 mg | INTRAMUSCULAR | Status: DC | PRN

## 2023-03-01 MED ORDER — PROPOFOL 1000 MG/100ML IV EMUL
5.0000 ug/kg/min | INTRAVENOUS | Status: DC
  Administered 2023-03-01: 30 ug/kg/min via INTRAVENOUS
  Administered 2023-03-01: 20 ug/kg/min via INTRAVENOUS
  Filled 2023-03-01: qty 200

## 2023-03-01 MED ORDER — GLYCOPYRROLATE 0.2 MG/ML IJ SOLN
0.2000 mg | INTRAMUSCULAR | Status: DC | PRN
  Administered 2023-03-01: 0.2 mg via INTRAVENOUS
  Filled 2023-03-01: qty 1

## 2023-03-01 MED ORDER — HYDROMORPHONE HCL 1 MG/ML IJ SOLN
1.0000 mg | Freq: Once | INTRAMUSCULAR | Status: AC
  Administered 2023-03-01: 1 mg via INTRAVENOUS

## 2023-03-01 MED ORDER — MORPHINE 100MG IN NS 100ML (1MG/ML) PREMIX INFUSION
0.0000 mg/h | INTRAVENOUS | Status: DC
  Administered 2023-03-01: 5 mg/h via INTRAVENOUS
  Administered 2023-03-01 (×3): 20 mg/h via INTRAVENOUS
  Filled 2023-03-01 (×4): qty 100

## 2023-03-01 MED ORDER — ONDANSETRON HCL 4 MG/2ML IJ SOLN: 4.0000 mg | Freq: Four times a day (QID) | INTRAMUSCULAR | Status: DC | PRN

## 2023-03-01 MED ORDER — HYDROMORPHONE HCL 1 MG/ML IJ SOLN
INTRAMUSCULAR | Status: AC
  Filled 2023-03-01: qty 1

## 2023-03-01 MED ORDER — HYDROMORPHONE HCL 1 MG/ML IJ SOLN
1.0000 mg | INTRAMUSCULAR | Status: DC | PRN
  Administered 2023-03-01 (×2): 1 mg via INTRAVENOUS
  Filled 2023-03-01: qty 1

## 2023-03-01 MED ORDER — ONDANSETRON 4 MG PO TBDP: 4.0000 mg | ORAL_TABLET | Freq: Four times a day (QID) | ORAL | Status: DC | PRN

## 2023-03-01 MED ORDER — HYDROMORPHONE HCL 1 MG/ML IJ SOLN: 1.0000 mg | INTRAMUSCULAR | Status: DC | PRN

## 2023-03-04 ENCOUNTER — Ambulatory Visit (INDEPENDENT_AMBULATORY_CARE_PROVIDER_SITE_OTHER): Payer: Medicare Other | Admitting: Family Medicine

## 2023-03-04 NOTE — Progress Notes (Signed)
Discussed with palliative care regarding patient's comfort care plan today.  Family understands that patient may have a chance of being weaned off of the ventilator but they are worried about her quality of life after this hospitalization in the setting of extensive wound care and independency.  Family has made it clear that patient would not want to be in a nursing home or facility.  Palliative team has reached out to her daughter [name] this morning, who would like to start patient on comfort medications while waiting for the rest of family to arrive to the hospital.  We will hold off on extubation until everybody gets here.

## 2023-03-04 NOTE — Progress Notes (Signed)
TF on hold due to pt restlessness/position in bed. Talked to [facility] and okay to hold.

## 2023-03-04 NOTE — Progress Notes (Signed)
   March 22, 2023 1054  Spiritual Encounters  Type of Visit Initial  Care provided to: Pt and family  Conversation partners present during encounter Nurse  Referral source Physician  Reason for visit End-of-life  OnCall Visit No  Spiritual Framework  Presenting Themes Meaning/purpose/sources of inspiration;Goals in life/care;Values and beliefs;Significant life change;Impactful experiences and emotions  Community/Connection Family  Patient Stress Factors Other (Comment) (End of l.ife)  Family Stress Factors Loss  Interventions  Spiritual Care Interventions Made Established relationship of care and support;Compassionate presence;Reflective listening;Decision-making support/facilitation;Prayer  Intervention Outcomes  Outcomes Connection to spiritual care;Awareness around self/spiritual resourses;Connection to values and goals of care;Awareness of support;Reduced anxiety  Spiritual Care Plan  Spiritual Care Issues Still Outstanding Chaplain will continue to follow  Follow up plan  Chaplain Kenton Kingfisher will revist before 1200 on 03/22/2023   Chaplain provided care to family and patient. Patient's older sister and niece were present. Patient appeared uncomfortable. Niece asked chaplain to return after patient's husband arrives. Niece informed chaplain that the patient's husband would be the decision maker for the patient's continued care.   Note Prepared by Abbott Pao, Chaplain Resident [phone]

## 2023-03-04 NOTE — Progress Notes (Signed)
Pt time of death 2018, confirmed by this RN and Clearnce Hasten, RN. Pt transported to morgue by Hildred Alamin, Agricultural consultant and Romie Minus, [place] at 2230. Family at bedside at time of death, no valuables to return. Pt placement notified, choice of funeral home documented in Post-Mortem flowsheet.   Unable to waste ketamine gtt as it came up from pharmacy and was not pulled from pyxis. This RN and Deberah Castle, RN witnessed waste of 1104mL of ketamine.

## 2023-03-04 NOTE — Progress Notes (Signed)
Nutrition Brief Note  Chart reviewed. Pt now transitioning to comfort care.  No further nutrition interventions planned at this time.  Please re-consult as needed.   Kimoni Pagliarulo, RD, LDN Clinical Dietitian RD pager # available in AMION  After hours/weekend pager # available in AMION   

## 2023-03-04 NOTE — Progress Notes (Signed)
MD made aware of brady. Prop off and fent max increased

## 2023-03-04 NOTE — Progress Notes (Signed)
   March 14, 2023 1141  Spiritual Encounters  Type of Visit Follow up  Care provided to: Family  Referral source Family  Reason for visit End-of-life  OnCall Visit No  Spiritual Framework  Presenting Themes Values and beliefs;Impactful experiences and emotions  Community/Connection Family  Patient Stress Factors Other (Comment) (EOL)  Family Stress Factors Loss  Interventions  Spiritual Care Interventions Made Established relationship of care and support;Compassionate presence;Reflective listening;Normalization of emotions;Prayer;Supported grief process  Spiritual Care Plan  Spiritual Care Issues Still Outstanding Referring to oncoming chaplain for further support  Follow up plan  Continue to support family as patient is EOL   Chaplain followed up with family per niece's request. Family is aware of chaplain support as needed and to let staff know to request further support. Husband asked chaplain to "pray for a miracle." Most of family seems to be accepting of the patient's situation.   Note prepared by Abbott Pao, [place] Resident [phone]

## 2023-03-04 NOTE — Progress Notes (Signed)
Daily Progress Note   Patient Name: Wendy Crawford       Date: Mar 19, 2023 DOB: [birth year]  Age: 67 y.o. MRN#: [id number] Attending Physician: Margaretha Seeds, MD Primary Care Physician: [name], MD Admit Date: 02/09/2023  Reason for Consultation/Follow-up: Establishing goals of care  Subjective: Patient nonverbal Multiple family members at bedside, see discussion below  Length of Stay: 9  Current Medications: Scheduled Meds:   Chlorhexidine Gluconate Cloth  6 each Topical Daily   furosemide  40 mg Intravenous Once   gabapentin  300 mg Per Tube Q8H   nutrition supplement (JUVEN)  1 packet Per Tube BID BM   mouth rinse  15 mL Mouth Rinse Q2H   oxyCODONE  20 mg Per Tube Q8H   pantoprazole (PROTONIX) IV  40 mg Intravenous Q24H   polyethylene glycol  17 g Per Tube Daily   predniSONE  40 mg Per Tube Daily   sodium chloride flush  10-40 mL Intracatheter Q12H    Continuous Infusions:  sodium chloride Stopped (2023-03-19 0254)   sodium chloride     fentaNYL infusion INTRAVENOUS 400 mcg/hr (03-19-2023 1016)   morphine 5 mg/hr (03/19/2023 1022)   propofol (DIPRIVAN) infusion Stopped (03-19-23 0857)    PRN Meds: sodium chloride, acetaminophen **OR** acetaminophen, diphenhydrAMINE, fentaNYL, glycopyrrolate **OR** glycopyrrolate **OR** glycopyrrolate, HYDROmorphone (DILAUDID) injection, midazolam, midazolam, morphine, ondansetron (ZOFRAN) IV, ondansetron **OR** ondansetron (ZOFRAN) IV, mouth rinse, polyvinyl alcohol, sodium chloride flush, white petrolatum  Physical Exam Constitutional:      Appearance: She is ill-appearing.     Comments: Appears agitated on ventilator Does not follow commands Remains on full vent support this AM  Skin:    General: Skin is warm and dry.             Vital  Signs: BP (!) 83/50   Pulse (!) 58   Temp 99.2 F (37.3 C) (Axillary)   Resp (!) 25   Wt 108.1 kg   SpO2 95%   BMI 45.03 kg/m  SpO2: SpO2: 95 % O2 Device: O2 Device: Ventilator O2 Flow Rate:    Intake/output summary:  Intake/Output Summary (Last 24 hours) at 2023-03-19 1046 Last data filed at Mar 19, 2023 0900 Gross per 24 hour  Intake 3853.24 ml  Output 1575 ml  Net 2278.24 ml    LBM: Last BM Date : 02/28/23 Baseline Weight: Weight: 106.7 kg Most recent weight: Weight: 108.1 kg      Patient Active Problem List   Diagnosis Date Noted   Necrotizing fasciitis ([place]) 02/23/2023   Hypotension due to hypovolemia 02/27/2023   Septic shock ([place]) 02/10/2023   Fournier gangrene 02/17/2023   Smoking 01/21/2023   Osteopenia 01/01/2023   Sleep difficulties 01/01/2023   Morbid obesity ([place]) 01/01/2023   BMI 40.0-44.9, adult ([place]) 01/01/2023   Adjustment insomnia 11/07/2022   Class 3 severe obesity with serious comorbidity and body mass index (BMI) of 45.0 to 49.9 in adult [facility]) 09/04/2022   Prediabetes 09/04/2022   Osteoarthritis of right knee 09/04/2022   Pre-diabetes 08/02/2022   Other hyperlipidemia 08/02/2022   Other specified hypothyroidism 08/02/2022   Caregiver stress syndrome - emotional eating 05/31/2022   Vitamin D deficiency 05/31/2022   Blood glucose  elevated 03/20/2022   Elevated lipoprotein(a) 03/20/2022   Mood disorder ([place]) 03/20/2022   Status post total left knee replacement 11/07/2018   Primary osteoarthritis of both knees 03/26/2018   Chronic pain of both knees 07/01/2017   Unilateral primary osteoarthritis, left knee 07/01/2017   Unilateral primary osteoarthritis, right knee 07/01/2017   Osteoarthritis of left hip 06/08/2016   Status post total replacement of left hip 06/08/2016   Chronic cluster headache, not intractable 09/13/2015   COPD exacerbation ([place]) 09/13/2015   Primary snoring 06/17/2014   Cluster headaches 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 female  with past medical history of obesity, depression, headaches, osteoarthritis and tobacco use  admitted on 02/28/2023 with dizziness and low blood pressure.  Patient diagnosed with septic shock from necrotizing soft tissue infection of buttock and perineum.  Found to have E. coli bacteremia.  Patient was acute hypoxic respiratory failure requiring mechanical ventilator.  PMT consulted to discuss goals of care.   Assessment: Saw patient multiple times throughout the day -initially saw patient this a.m. while she remained intubated.  Discussed plan with critical care team.  Called to daughter and daughter shares they plan to proceed with comfort measures only and extubation today.  We discussed starting medication to promote comfort and freeing her from the ventilator once family arrives.  Daughter agrees with this and asks team to go ahead and start comfort medications.  Critical care team notified.  Called back later to bedside following terminal extubation with patient with poor symptom control.  CCM had already addressed symptoms and RN was providing medications.  Family educated on medications being provided and how we were promoting her comfort.  Eventually patient appeared much more comfortable.  Recommendations/Plan: Continue comfort measures only -morphine infusion and as needed Versed Anticipate hospital death  Care plan was discussed with Dr. Alfonse Spruce, Dr. Ebony Hail, RN, patient's spouse and daughter and sister  Thank you for allowing the Palliative Medicine Team to assist in the care of this patient.  *Please note that this is a verbal dictation therefore any spelling or grammatical errors are due to the "[employer] One" system  interpretation.  Juel Burrow, DNP, [facility] Palliative Medicine Team Team Phone # [phone]  Pager [phone]

## 2023-03-04 NOTE — Progress Notes (Signed)
Pt continues to show signs of agitation and requiring more sedation and PRN meds. Use of these meds have lead to low BP (MAP 61) and resident verbal for MAP greater that 60

## 2023-03-04 NOTE — Progress Notes (Signed)
NAME:  Wendy Crawford, MRN:  [id number], DOB:  December 01, 1956, LOS: 51 ADMISSION DATE:  02/19/2023, CONSULTATION DATE:  02/07/2023 REFERRING MD:  EDP, CHIEF COMPLAINT:  hypotension   History of Present Illness:  Wendy Crawford is a 67 y.o. F with PMH significant for obesity, depression, headaches, osteoarthritis and tobacco use who presents to the ED with dizziness and low blood pressure.  She began feeling poorly about one week ago when she noticed a sore on her buttock, this has grown and redness and pain has spread to both buttocks with some purulent drainage.   She then developed a cough and was diagnosed with the flu on 3/17 and has felt short of breath with nausea and vomiting and very little po intake for the last few days.  She denies chest pain, leg edema or pain.      In the ED, she was given 3.5L IVF and broad spectrum abx.  CXR consistent with LLL pneumonia.  Labs showed lactic acid of 1.7, Na 127, creatinine 1.95, WBC 115, platelets 147, WBC 11.5.   She required levophed for hypotension despite IVF, therefore PCCM consulted   Pertinent  Medical History  Osteoarthritis Cluster headaches Depression Hyperlipidemia IBS Psoriasis  Significant Hospital Events: Including procedures, antibiotic start and stop dates in addition to other pertinent events   3/20 presented with cellulitis, PNA and septic shock, PCCM admit, started on levophed  3/21 intubated, CVC line placed 3/21 debridement of wounds 3/22 debridement of wounds  Interim History / Subjective:   Overnight, patient was switched from Precedex to propofol due to bradycardia.  This morning patient is restless and uncomfortable.  She intermittently follow commands.  Objective   Blood pressure (!) 94/49, pulse (!) 51, temperature 99.2 F (37.3 C), temperature source Axillary, resp. rate (!) 25, weight 108.1 kg, SpO2 96 %.    Vent Mode: PRVC FiO2 (%):  [40 %] 40 % Set Rate:  [25 bmp] 25 bmp Vt Set:  [380 mL] 380 mL PEEP:  [5  cmH20] 5 cmH20 Pressure Support:  [5 cmH20] 5 cmH20 Plateau Pressure:  [15 cmH20-19 cmH20] 19 cmH20   Intake/Output Summary (Last 24 hours) at 03/28/23 0721 Last data filed at 2023/03/28 0600 Gross per 24 hour  Intake 4536.23 ml  Output 3275 ml  Net 1261.23 ml    Filed Weights   02/27/23 0500 02/28/23 0500 2023/03/28 0317  Weight: 107.5 kg 107.7 kg 108.1 kg    Examination: Physical Exam Constitutional:      Comments: Awake and alert.  Follow commands intermittently.  Appears uncomfortable  HENT:     Head: Normocephalic.  Eyes:     General:        Right eye: No discharge.        Left eye: No discharge.     Conjunctiva/sclera: Conjunctivae normal.     Pupils: Pupils are equal, round, and reactive to light.  Cardiovascular:     Rate and Rhythm: Normal rate and regular rhythm.     Comments: Trace edema bilateral lower and upper extremities Pulmonary:     Effort: Pulmonary effort is normal.     Comments: Diminished breath sounds bilaterally Abdominal:     General: Bowel sounds are normal. There is no distension.     Palpations: Abdomen is soft.  Genitourinary:    Comments: Foley and rectal tube in place Musculoskeletal:        General: Normal range of motion.     Cervical back: Normal range of motion.  Skin:    General: Skin is warm.      Resolved Hospital Problem list   AKI from ATN 2nd to sepsis, Anion gap metabolic acidosis with lactic acidosis  Assessment & Plan:  Acute hypoxic respiratory failure from LLL pneumonia, and pulmonary edema, requiring mechanical ventilator Possible COPD exacerbation Patient tolerated CPAP mode well.  Barrier for extubation is in mentation.  Original plan was to transfer patient to an LTAC to continue the weaning process.  I had a conversation with family yesterday and they are in agreement that patient would not want to be on a ventilator or reside in a facility.  They would like to proceed with compassionate extubation but they would  like more family members to visit her. -Sedation with propofol and fentanyl. PO Oxycodone. Versed as needed. -Continue IV Lasix, this is to help reduce respiratory distress -Continue bronchodilators. -Oxycodone 20 mg every 8 hours  Septic shock from necrotizing soft tissue infection of b/l buttock and perineum. E. coli bacteremia Underwent debridement on 3/21 and 3/22.  -Last dose of Zosyn today  Hyperglycemia due to acute illness -Will discontinue tube feed and SSI  Anemia of critical illness and chronic disease. She may has a component of iron deficiency anemia from her surgeries as well.  So far her hemoglobin is stable around 9's.  Depression. -resume citalopram  Best Practice (right click and "Reselect all SmartList Selections" daily)   Diet/type: NPO DVT prophylaxis: lovenox GI prophylaxis: PPI Lines: central Foley:  Yes, and it is still needed Code Status:  DNR Last date of multidisciplinary goals of care discussion [3/28]  Labs   CBC: Recent Labs  Lab 02/25/23 0317 02/25/23 0323 02/26/23 0237 02/27/23 0447 02/28/23 0520 03-06-2023 0236  WBC 9.4  --  11.0* 9.2 9.6 10.2  HGB 9.0* 9.2* 9.4* 9.1* 9.1* 9.1*  HCT 28.8* 27.0* 29.5* 28.3* 28.5* 29.5*  MCV 90.9  --  90.5 90.7 91.1 92.5  PLT 211  --  282 241 290 277     Basic Metabolic Panel: Recent Labs  Lab 02/17/2023 1539 02/23/23 0159 02/24/23 0400 02/25/23 0317 02/25/23 0323 02/26/23 0237 02/27/23 0447 02/28/23 0520 03-06-23 0236  NA  --  132*   < > 135 137 139 144 144 145  K  --  3.3*   < > 3.6 3.8 3.4* 4.0 3.9 3.8  CL  --  101   < > 101  --  102 104 105 105  CO2  --  20*   < > 22  --  26 30 31 31   GLUCOSE  --  102*   < > 124*  --  134* 117* 141* 133*  BUN  --  42*   < > 34*  --  29* 42* 40* 38*  CREATININE  --  1.21*   < > 0.94  --  0.83 0.79 0.73 0.77  CALCIUM  --  7.7*   < > 7.8*  --  8.1* 8.4* 8.6* 8.9  MG 2.7* 2.4  --  2.4  --   --   --   --   --   PHOS 3.6 2.6  --  3.5  --   --   --   --   --     < > = values in this interval not displayed.    GFR: Estimated Creatinine Clearance: 78.5 mL/min (by C-G formula based on SCr of 0.77 mg/dL). Recent Labs  Lab 02/26/23 [phone] 02/27/23 0447 02/28/23 0520 03-06-23 0236  WBC 11.0* 9.2 9.6 10.2     Liver Function Tests: No results for input(s): "AST", "ALT", "ALKPHOS", "BILITOT", "PROT", "ALBUMIN" in the last 168 hours.  No results for input(s): "LIPASE", "AMYLASE" in the last 168 hours. No results for input(s): "AMMONIA" in the last 168 hours.  ABG    Component Value Date/Time   PHART 7.329 (L) 02/25/2023 0323   PCO2ART 44.7 02/25/2023 0323   PO2ART 96 02/25/2023 0323   HCO3 23.3 02/25/2023 0323   TCO2 25 02/25/2023 0323   ACIDBASEDEF 2.0 02/25/2023 0323   O2SAT 96 02/25/2023 0323     Coagulation Profile: No results for input(s): "INR", "PROTIME" in the last 168 hours.   Cardiac Enzymes: No results for input(s): "CKTOTAL", "CKMB", "CKMBINDEX", "TROPONINI" in the last 168 hours.  HbA1C: Hgb A1c MFr Bld  Date/Time Value Ref Range Status  01/21/2023 10:08 AM 5.8 (H) 4.8 - 5.6 % Final    Comment:             Prediabetes: 5.7 - 6.4          Diabetes: >6.4          Glycemic control for adults with diabetes: <7.0   07/26/2022 09:51 AM 5.9 (H) 4.8 - 5.6 % Final    Comment:             Prediabetes: 5.7 - 6.4          Diabetes: >6.4          Glycemic control for adults with diabetes: <7.0     CBG: Recent Labs  Lab 02/28/23 1521 02/28/23 1907 02/28/23 2302 2023-03-13 0302 03/13/23 0708  GLUCAP Terrell, DO Internal Medicine Residency My pager: [phone]

## 2023-03-04 NOTE — Progress Notes (Signed)
eLink Physician-Brief Progress Note Patient Name: MELIKA MONTORO DOB: December 17, 1955 MRN: [id number]   Date of Service  Mar 25, 2023  HPI/Events of Note  Patient is extremely bradycardic on Precedex limiting the ability to titrate for optimal sedation.  eICU Interventions  Precedex discontinued. Propofol ordered.        Kerry Kass Shatima Zalar Mar 25, 2023, 2:14 AM

## 2023-03-04 NOTE — Progress Notes (Signed)
PT Cancellation Note  Patient Details Name: Wendy Crawford MRN: [id number] DOB: [birth year]   Cancelled Treatment:    Reason Eval/Treat Not Completed: (P) Other (comment). Per chart and RN, plan is for comfort care, therefore will discontinue hydrotherapy treatment.   Moishe Spice, PT, DPT Acute Rehabilitation Services  Office: [place] 03-21-23, 12:53 PM

## 2023-03-04 NOTE — Death Summary Note (Signed)
  Death Summary   Name: Wendy Crawford MRN: [id number] DOB: 05/03/1956 67 y.o.  Date of Admission: 02/11/2023  5:34 PM Date of Discharge: 2023-03-09 Attending Physician: Margaretha Seeds, MD  Discharge Diagnosis: Principal Problem:   Septic shock [facility] Active Problems:   Tobacco use disorder   Fournier gangrene   Necrotizing fasciitis ([place])   Acute hypoxic respiratory failure ([place])   Pressure injury of skin   E coli bacteremia   Pneumonia  Cause of death: Asytole Time of death: 20:18  Disposition and follow-up:   Wendy Crawford was discharged from [facility] in expired condition.    Hospital Course: Wendy Crawford was a 67 y.o. F with PMH significant for obesity, depression, osteoarthritis and tobacco use disorder who presented to the hospital for gluteal cellulitis and septic shock secondary to necrotizing soft tissue infection of the gluteal region and perineum.  Patient was transferred to the OR on 3/21 urgently for debridement surgery.  Patient remained intubated after the surgery due to her septic shock requiring vasopressor, ongoing need for surgery and possible left lower lobe pneumonia.  Patient underwent the second debridement operation on 3/22.  Her wound culture grew E. coli and Bacteroides, which she was started on IV Zosyn.  Her blood culture also grew E. Coli.  Patient received IV Zosyn for 9 days total.  She had been receiving hydrotherapy and extensive wound care inpatient.  Patient remained intubated after her debridement surgeries.  She required high dose of sedation to keep her comfortable.  Patient was able to stay on weaning mode but her mental status was a barrier for extubation.  She was started on bronchodilators and a short course of Prednisone due to possible COPD exacerbation.  Multiple goals of care conversations between the treatment team, palliative team and family were made during this hospitalization.  Patient's husband, POA, and  family expressed that patient would not want to be on a ventilator or reside in a nursing home/facility.  Family understood that there was a chance that she could be weaned off of the ventilator but they also concerned about her poor quality of life that included extensive wound care, possible diverting colostomy and her independence after this hospitalization.  The decision was to transition to comfort care and compassionate extubation.  She was extubated on March 09, 2023 and was started on comfort medications.  Signed: Gaylan Gerold, DO 09-Mar-2023, 4:00 PM

## 2023-03-04 NOTE — Progress Notes (Signed)
Pt extubated to comfort care with RN and family at bedside. 

## 2023-03-04 DEATH — deceased

## 2023-03-18 ENCOUNTER — Ambulatory Visit (INDEPENDENT_AMBULATORY_CARE_PROVIDER_SITE_OTHER): Payer: Medicare Other | Admitting: Family Medicine

## 2023-10-01 IMAGING — MG MM DIGITAL SCREENING BILAT W/ TOMO AND CAD
8 of 17 series · 8 of 40 positions shown · non-contrast
Comparison: Previous exam(s).

CLINICAL DATA: Screening.

EXAM:
DIGITAL SCREENING BILATERAL MAMMOGRAM WITH TOMOSYNTHESIS AND CAD
TECHNIQUE: Bilateral screening digital craniocaudal and mediolateral oblique
mammograms were obtained. Bilateral screening digital breast
tomosynthesis was performed. The images were evaluated with
computer-aided detection.

[L CC synth-2D (1 of 3)]
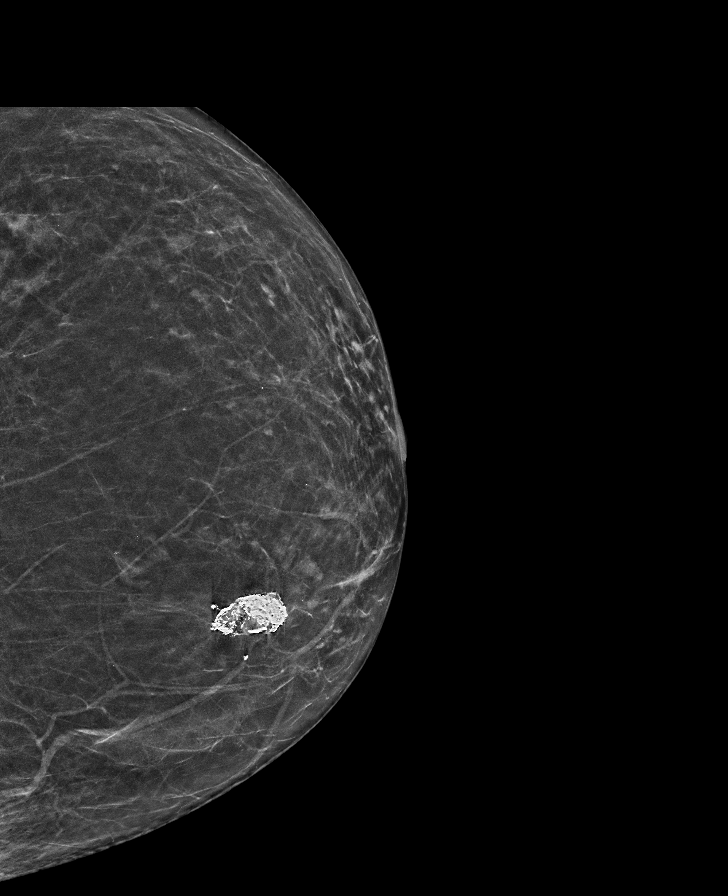

[R CC synth-2D (1 of 2)]
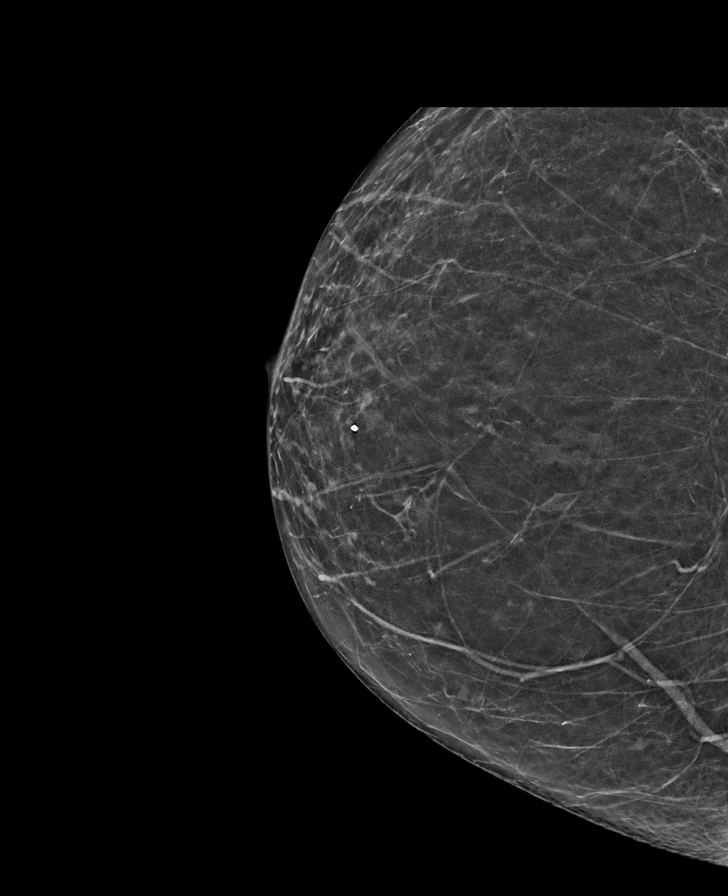

[R CC synth-2D (2 of 2)]
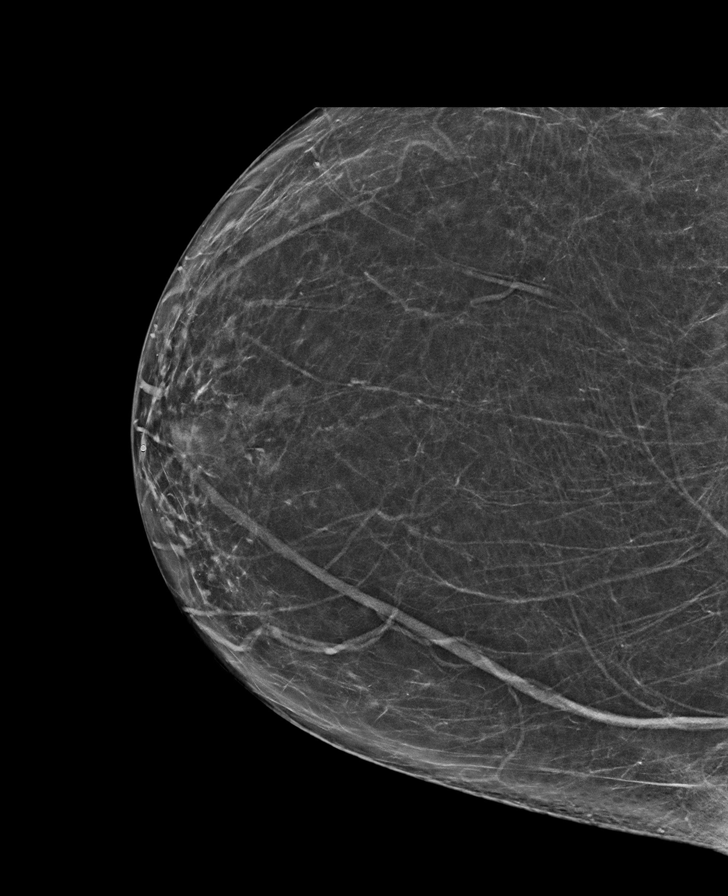

[L CC synth-2D (2 of 3)]
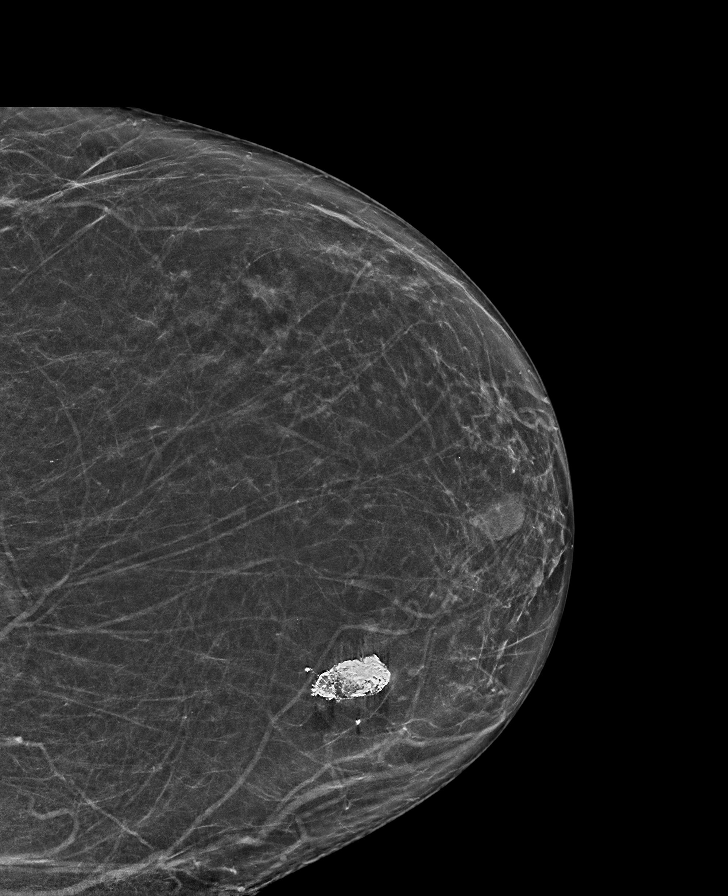

[R MLO synth-2D]
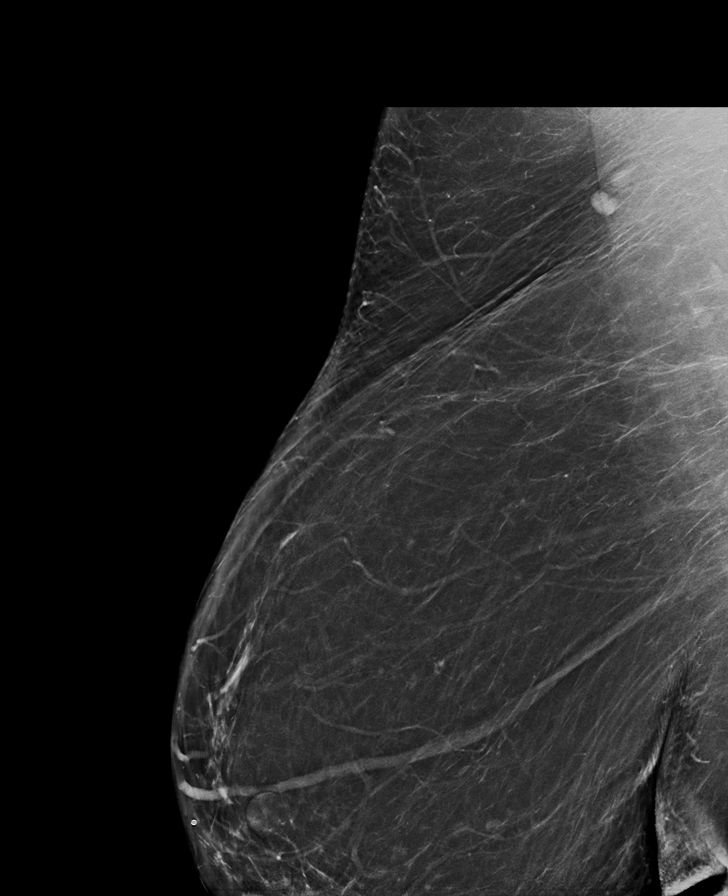

[L MLO synth-2D]
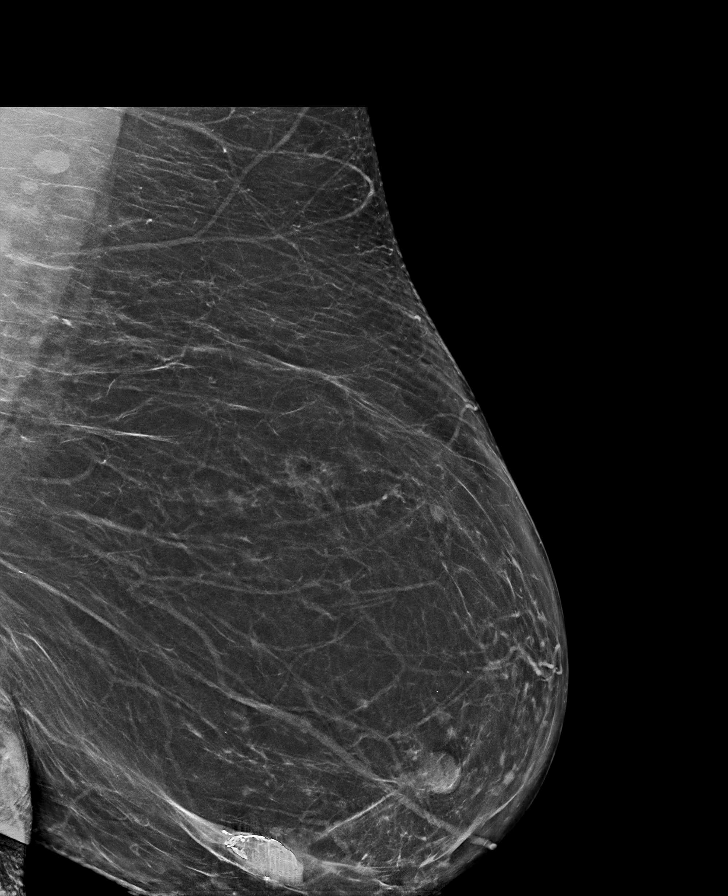

[L CC synth-2D (3 of 3)]
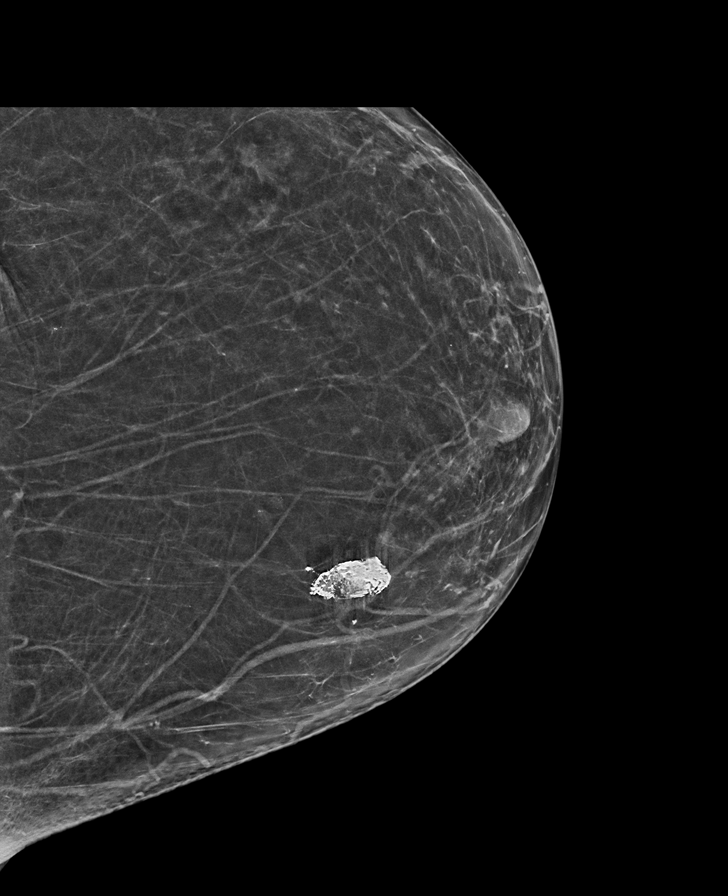

[L CC tomo · tomo slice 37/72.0]
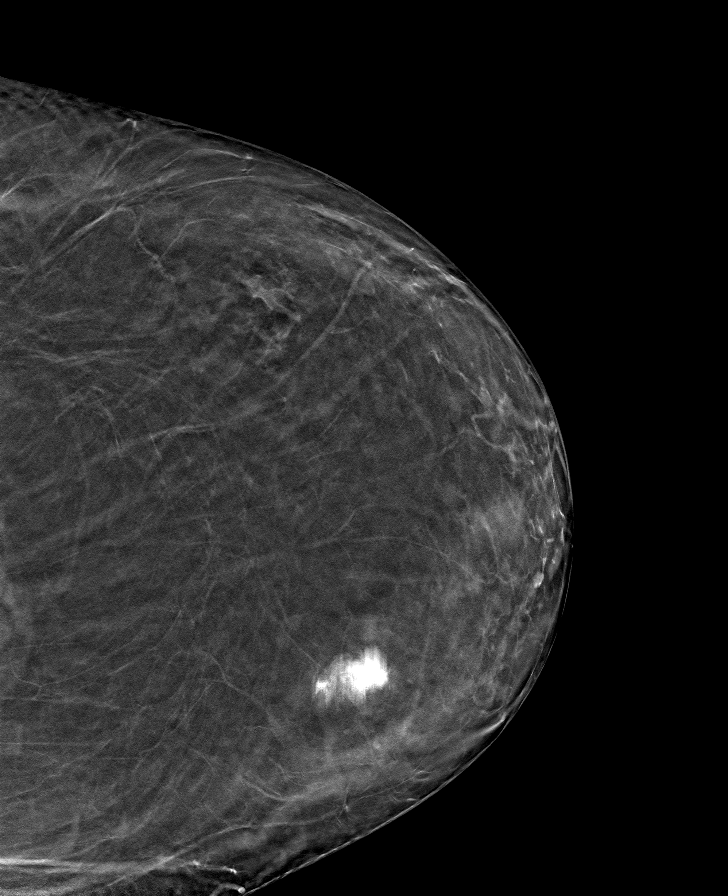

[8 of 40 positions shown; findings below may reference images not displayed]

ACR Breast Density Category b: There are scattered areas of
fibroglandular density.
FINDINGS: There are no findings suspicious for malignancy.
IMPRESSION: No mammographic evidence of malignancy. A result letter of this
screening mammogram will be mailed directly to the patient.

RECOMMENDATION:
Screening mammogram in one year. (Code:51-O-LD2)

BI-RADS CATEGORY  1: Negative.
